# Patient Record
Sex: Male | Born: 1950 | Race: Black or African American | Hispanic: No | Marital: Married | Smoking: Former smoker
Health system: Southern US, Community
[De-identification: ages and names within clinical notes are randomized; demographics above are authoritative.]

## PROBLEM LIST (undated history)

## (undated) DIAGNOSIS — C61 Malignant neoplasm of prostate: Secondary | ICD-10-CM

## (undated) DIAGNOSIS — G4733 Obstructive sleep apnea (adult) (pediatric): Secondary | ICD-10-CM

## (undated) DIAGNOSIS — N200 Calculus of kidney: Secondary | ICD-10-CM

## (undated) DIAGNOSIS — E785 Hyperlipidemia, unspecified: Secondary | ICD-10-CM

## (undated) DIAGNOSIS — M1990 Unspecified osteoarthritis, unspecified site: Secondary | ICD-10-CM

## (undated) DIAGNOSIS — E119 Type 2 diabetes mellitus without complications: Secondary | ICD-10-CM

## (undated) DIAGNOSIS — Z9989 Dependence on other enabling machines and devices: Principal | ICD-10-CM

## (undated) DIAGNOSIS — I10 Essential (primary) hypertension: Secondary | ICD-10-CM

## (undated) HISTORY — DX: Essential (primary) hypertension: I10

## (undated) HISTORY — DX: Dependence on other enabling machines and devices: Z99.89

## (undated) HISTORY — DX: Hyperlipidemia, unspecified: E78.5

## (undated) HISTORY — DX: Type 2 diabetes mellitus without complications: E11.9

## (undated) HISTORY — PX: BACK SURGERY: SHX140

## (undated) HISTORY — DX: Malignant neoplasm of prostate: C61

## (undated) HISTORY — PX: PROSTATE SURGERY: SHX751

## (undated) HISTORY — DX: Calculus of kidney: N20.0

## (undated) HISTORY — DX: Obstructive sleep apnea (adult) (pediatric): G47.33

## (undated) SURGERY — COLONOSCOPY
Anesthesia: Moderate Sedation

---

## 1998-01-06 ENCOUNTER — Ambulatory Visit: Admission: RE | Admit: 1998-01-06 | Discharge: 1998-01-06 | Payer: Self-pay | Admitting: Family Medicine

## 1998-02-24 ENCOUNTER — Encounter: Admission: RE | Admit: 1998-02-24 | Discharge: 1998-02-24 | Payer: Self-pay | Admitting: *Deleted

## 1998-03-24 ENCOUNTER — Encounter: Payer: Self-pay | Admitting: Emergency Medicine

## 1998-03-24 ENCOUNTER — Emergency Department (HOSPITAL_COMMUNITY): Admission: EM | Admit: 1998-03-24 | Discharge: 1998-03-24 | Payer: Self-pay | Admitting: Emergency Medicine

## 1998-06-22 ENCOUNTER — Ambulatory Visit: Admission: RE | Admit: 1998-06-22 | Discharge: 1998-06-22 | Payer: Self-pay | Admitting: Internal Medicine

## 1999-03-05 ENCOUNTER — Encounter: Payer: Self-pay | Admitting: Occupational Medicine

## 1999-03-05 ENCOUNTER — Ambulatory Visit: Admission: RE | Admit: 1999-03-05 | Discharge: 1999-03-05 | Payer: Self-pay | Admitting: Occupational Medicine

## 2001-12-28 ENCOUNTER — Encounter: Payer: Self-pay | Admitting: Emergency Medicine

## 2001-12-28 ENCOUNTER — Emergency Department (HOSPITAL_COMMUNITY): Admission: EM | Admit: 2001-12-28 | Discharge: 2001-12-28 | Payer: Self-pay | Admitting: Emergency Medicine

## 2002-06-05 ENCOUNTER — Emergency Department (HOSPITAL_COMMUNITY): Admission: EM | Admit: 2002-06-05 | Discharge: 2002-06-05 | Payer: Self-pay | Admitting: Emergency Medicine

## 2002-06-05 ENCOUNTER — Encounter: Payer: Self-pay | Admitting: Emergency Medicine

## 2006-11-21 ENCOUNTER — Ambulatory Visit: Admission: RE | Admit: 2006-11-21 | Discharge: 2007-02-19 | Payer: Self-pay | Admitting: Radiation Oncology

## 2007-01-15 LAB — URINALYSIS, MICROSCOPIC - CHCC
Bilirubin (Urine): NEGATIVE
Blood: NEGATIVE
Ketones: NEGATIVE mg/dL
Nitrite: NEGATIVE
Specific Gravity, Urine: 1.01 (ref 1.003–1.035)

## 2007-02-02 ENCOUNTER — Encounter: Admission: RE | Admit: 2007-02-02 | Discharge: 2007-02-02 | Payer: Self-pay | Admitting: Urology

## 2007-02-26 ENCOUNTER — Ambulatory Visit (HOSPITAL_BASED_OUTPATIENT_CLINIC_OR_DEPARTMENT_OTHER): Admission: RE | Admit: 2007-02-26 | Discharge: 2007-02-26 | Payer: Self-pay | Admitting: Urology

## 2007-03-19 ENCOUNTER — Ambulatory Visit: Admission: RE | Admit: 2007-03-19 | Discharge: 2007-04-08 | Payer: Self-pay | Admitting: Radiation Oncology

## 2007-04-10 ENCOUNTER — Ambulatory Visit: Payer: Self-pay | Admitting: Internal Medicine

## 2007-04-16 ENCOUNTER — Encounter (INDEPENDENT_AMBULATORY_CARE_PROVIDER_SITE_OTHER): Payer: Self-pay | Admitting: *Deleted

## 2007-04-16 ENCOUNTER — Inpatient Hospital Stay (HOSPITAL_COMMUNITY): Admission: EM | Admit: 2007-04-16 | Discharge: 2007-04-17 | Payer: Self-pay | Admitting: Emergency Medicine

## 2007-05-18 ENCOUNTER — Ambulatory Visit: Payer: Self-pay | Admitting: Internal Medicine

## 2007-06-01 ENCOUNTER — Encounter: Payer: Self-pay | Admitting: Internal Medicine

## 2007-06-01 ENCOUNTER — Ambulatory Visit: Payer: Self-pay | Admitting: Internal Medicine

## 2008-03-30 ENCOUNTER — Inpatient Hospital Stay (HOSPITAL_COMMUNITY): Admission: EM | Admit: 2008-03-30 | Discharge: 2008-04-02 | Payer: Self-pay | Admitting: Physician Assistant

## 2008-03-31 ENCOUNTER — Encounter (INDEPENDENT_AMBULATORY_CARE_PROVIDER_SITE_OTHER): Payer: Self-pay | Admitting: Internal Medicine

## 2008-03-31 ENCOUNTER — Ambulatory Visit: Payer: Self-pay | Admitting: Gastroenterology

## 2008-03-31 ENCOUNTER — Ambulatory Visit: Payer: Self-pay | Admitting: Surgery

## 2008-04-01 ENCOUNTER — Encounter (INDEPENDENT_AMBULATORY_CARE_PROVIDER_SITE_OTHER): Payer: Self-pay | Admitting: Internal Medicine

## 2008-04-15 ENCOUNTER — Encounter: Admission: RE | Admit: 2008-04-15 | Discharge: 2008-04-15 | Payer: Self-pay | Admitting: Family Medicine

## 2008-04-17 ENCOUNTER — Encounter: Admission: RE | Admit: 2008-04-17 | Discharge: 2008-04-17 | Payer: Self-pay | Admitting: Family Medicine

## 2010-09-21 NOTE — Op Note (Signed)
Rodney Walsh, PLOUFF NO.:  [id number]   MEDICAL RECORD NO.:  [id number]          PATIENT TYPE:  AMB   LOCATION:  [facility]                         FACILITY:  [facility]   PHYSICIAN:  Valetta Fuller, M.D.  DATE OF BIRTH:  Aug 16, 1950   DATE OF PROCEDURE:  02/26/2007  DATE OF DISCHARGE:                               OPERATIVE REPORT   PREOPERATIVE DIAGNOSIS:  Clinical stage T1C adenocarcinoma prostate,  intermediate risk.   POSTOPERATIVE DIAGNOSIS:  Clinical stage T1C adenocarcinoma prostate,  intermediate risk.   PROCEDURE PERFORMED:  1. Interstitial I-125 seed implantation of the prostate.  2. Flexible cystoscopy   SURGEON:  Valetta Fuller, M.D.   ASSISTANT:  Maryln Gottron, M.D.   ANESTHESIA:  General.   INDICATIONS:  Mr. Schwandt is a 60 year old male who was diagnosed with  intermediate risk clinical stage T1C adenocarcinoma of the prostate.  The patient's PSA was minimally elevated in the 3-3.5 range.  The  patient did, however, have a higher Gleason score component to his tumor  with tumor at the left base and left mid portions of the prostate.  This  was of moderate volume in these locations, and was Gleason 4 + 3 equal  7.  The patient's prostate was approximately 45 grams.  The patient was  felt not to be a particularly good surgical candidate because of some  medical comorbidities as well as morbid obesity with a weight in excess  of 300 pounds..  We sent him for radiation consultation.  It was some  elected to treat him with 5 weeks of external beam, with conformal  radiation therapy, and then a seed boost.  He presents now for the seed  boost.  He has done well with his ion Mark 2 radiation therapy.  The  patient appears to understand the advantages and disadvantages of  radiation therapy for treatment of prostate cancer.  Full informed  consent was obtained.  The potential complications and risks were  discussed with the patient in detail.   TECHNIQUE AND FINDINGS:  The patient was brought to the operating room.  He had successful induction of general endotracheal anesthesia and was  placed in the mid lithotomy position.  Foley catheter with contrast in  the balloon was then placed, and the bladder emptied.  A transrectal  ultrasound was placed, and anchored to the stand.  The radiation  oncology then did real time contouring of the prostate, urethra, and  rectum.  Dosing parameters were then set and plan established.  Anchor  needles were then placed in the prostate.  The [employer] was utilized..  A total of 22 needles were placed with each  needle passage done with real time sagittal ultrasound guidance.  Once  positioning of the needle was confirmed, then the robotic implanter  delivered the seeds.  A total of 53 active seeds were implanted.  At the  completion of the procedure, the fluoroscopic image demonstrated nice  distribution of the seeds.  The Foley catheter was removed and flexible  cystoscopy was performed.  No seeds were  noted.  A new Foley catheter was placed and left to drainage bag.  The  patient appeared to tolerate the procedure well.  There are no obvious  complications or difficulties, and the patient was brought to the  recovery room in stable condition.           ______________________________  Valetta Fuller, M.D.  Electronically Signed     DSG/MEDQ  D:  02/27/2007  T:  02/27/2007  Job:  [id number]   cc:   [facility] Dr. Gearldine Bienenstock

## 2010-09-21 NOTE — Discharge Summary (Signed)
NAMERENLY, ROOTS NO.:  [id number]   MEDICAL RECORD NO.:  [id number]          PATIENT TYPE:  INP   LOCATION:  1407                         FACILITY:  [facility]   PHYSICIAN:  Beckey Rutter, MD  DATE OF BIRTH:  08/03/1950   DATE OF ADMISSION:  03/29/2008  DATE OF DISCHARGE:                               DISCHARGE SUMMARY   PRIMARY CARE PHYSICIAN:  Unassigned.   CHIEF COMPLAINT/HISTORY OF PRESENT ILLNESS:  This is a 60 year old  pleasant African American male with a past medical history significant  for hypertension, diabetes type 2, congestive heart failure,  hyperlipidemia admitted on November 12th because of passed out.   HOSPITAL COURSE:  During the hospital stay, the patient was worked up  for syncope.  The results of the syncope are essentially negative,  although the MRI and MRA could not be done secondary to his weight.  The  patient will need to go outside for MRI and MRA.  He preferred to follow  up with Dr. Nilda Riggs to further evaluate this syncope.  He wanted  an open MRI to be done after he visited Dr. Ramiro Harvest.  I had a lengthy  discussion with him regarding the etiology of his syncope, and the plan  now, as discussed above, is to be released and do an MRI as per Dr.  Nilda Riggs.   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  I had a  discussion with the neurologist on call, Dr. Terrace Arabia, today.  She  recommended, according to the [facility], the patient should not  be driving for 6 months, and that includes, of course, driving the  school bus or his private vehicle.   HOSPITAL PROCEDURES:  1. EEG interpreted by Dr. Lesia Sago.  Impression:  This is a normal      EEG recording in awake and sleeping states.  No evidence of ictal      or interictal changes were seen.  2. Renal ultrasound done on January 29, 2008 showing no acute      findings by  renal ultrasound.  3. CT head without contrast done this morning, April 02, 2008.      Patient was showing cerebral atrophy without acute intracranial      abnormality.  4. On March 29, 2008, patient had a chest x-ray that showed      cardiomegaly without evidence of acute cardiomyopathy.   DISCHARGE DIAGNOSES:  1. Syncope, etiology unclear.  2. Hypertension.  3. Diabetes type 2.  4. Congestion heart failure.  5. Hyperlipidemia.   DISCHARGE MEDICATIONS:  1. Metformin 500 mg p.o. b.i.d.  2. Potassium chloride 20 mEq p.o. daily.  3. Lasix 40 mg p.o. daily.  4. Lisinopril 10 mg p.o. daily.  5. Prazosin 40 mg p.o. at bedtime.  6. Aspirin 81 mg daily.  7. Over-the-counter Aleve on a p.r.n. basis.   DISCHARGE PLAN:  Patient is discharged to follow up with Dr. Nilda Riggs next week.  Patient will  need an MRI/MRA and 2D echo for a  completion of his workup.  The need for a carotid duplex with him, and  he also wanted to follow up with Dr. Ramiro Harvest for the rest of the  workup.   Discussion time, including counseling, is more than 45 minutes.      Beckey Rutter, MD  Electronically Signed     EME/MEDQ  D:  04/02/2008  T:  04/02/2008  Job:  [id number]   cc:   Dr. Nilda Riggs

## 2010-09-21 NOTE — Discharge Summary (Signed)
Rodney Walsh, Rodney Walsh NO.:  [id number]   MEDICAL RECORD NO.:  [id number]           PATIENT TYPE:   LOCATION:                                 FACILITY:   PHYSICIAN:  Hillery Aldo, M.D.   DATE OF BIRTH:  [birth year]   DATE OF ADMISSION:  04/16/2007  DATE OF DISCHARGE:  04/17/2007                               DISCHARGE SUMMARY   PRIMARY CARE PHYSICIAN:  Unassigned.  [facility].   CARDIOLOGIST:  Dr. Jenne Campus.   UROLOGIST:  Dr. Isabel Caprice.   DISCHARGE DIAGNOSES:  1. Dyspnea:  Multifactorial with volume overload , diastolic      dysfunction/congestive heart failure, obesity hypoventilation      syndrome, and obstructive sleep apnea contributory.  2. Hypertension.  3. Type 2 diabetes.  4. Prostate cancer status post seed implant placement by Dr. Isabel Caprice.  5. Obstructive sleep apnea/obesity hypoventilation syndrome.  6. Eczema of the left thumb.   DISCHARGE MEDICATIONS:  1. Metformin 500 mg b.i.d.  2. Lisinopril 20 mg daily.  3. Flomax 0.4 mg daily.  4. Aspirin 81 mg daily.  5. Claritin 10 mg daily.  6. Lasix 40 mg daily.  7. Potassium chloride 20 mEq daily.  8. Triamcinolone ointment 0.5% to left thumb t.i.d. until symptoms      resolve.   CONSULTATIONS:  None.   BRIEF ADMISSION HISTORY OF PRESENT ILLNESS:  The patient is a 60-year-  old male who presented to the hospital with complaints of shortness of  breath and orthopnea.  He denied any associated chest pain or lower  extremity swelling.  He had no fever or cough.  He was instructed by [facility]  physician [facility] and to call 911 and to take 4 baby  aspirin, which he did.  He was admitted for further evaluation and  workup.  For the full details, please see the dictated admission H&P  done by Dr. Corky Downs.   PROCEDURES AND DIAGNOSTIC STUDIES:  1. Chest x-ray on April 15, 2007 showed increased heart size since      prior study,  with increased pulmonary vascular congestion and      diffuse interstitial infiltrates.  These findings were felt to be      consistent with interstitial edema and congestive heart failure.  2. 2-D echocardiogram on April 16, 2007, showed normal left      ventricular systolic function with an ejection fraction estimated      at 65%.  There was no diagnostic evidence of left ventricular      regional wall motion abnormality.  The left atrium was mildly      dilated.   DISCHARGE LABORATORY VALUES:  Cholesterol was 134, HDL 35, LDL 73,  triglycerides 132.  Cardiac enzymes were negative with the exception of  elevated total CK levels.  Hemoglobin A1c was 6.1%.  TSH was 2.667.  BNP  was less than 30.   HOSPITAL COURSE:  1. Dyspnea:  The patient's dyspnea was felt to be multifactorial.  Due  to concerns for acute decompensated congestive heart failure, the      patient was admitted to the telemetry floor and put on IV diuresis.      Despite the findings on chest radiography, his BNP was less than 30      on two occasions, which is not consistent with acute CHF.  To work      this up further, the patient underwent 2-D echocardiography with      the findings as noted above.  There was no evidence of systolic      dysfunction.  It is possible that the patient did have acute      diastolic heart failure related to his hypertension.  This was      likely superimposed on the background of ongoing obesity      hypoventilation syndrome, obstructive sleep apnea, and volume      overload related to excessive salt intake over the Thanksgiving      holiday.  Nevertheless, the patient responded well to diuresis, and      given his ongoing blood pressure control issues, will be discharged      on a routine dose of Lasix daily.  He is instructed to follow up      with Dr. Jenne Campus in 2-3 weeks' time for further evaluation and to      determine if his Lasix is adequately controlling his symptoms.  2.  Hypertension:  The patient's blood pressure was suboptimally      controlled with systolic pressures in the 170s.  He was continued      on lisinopril, and Lasix was added.  He should follow up with Dr.      Jenne Campus in 2-3 weeks to determine if his blood pressure is better      controlled with the addition of Lasix.  3. Diabetes:  The patient's diabetic control is excellent on his      current dose of metformin.  4. Prostate cancer:  The patient is status post seed implantation.  He      should follow up with Dr. Isabel Caprice as scheduled.  5. Obstructive sleep apnea/obesity hypoventilation syndrome:  The      patient was continued on the CPAP while in the hospital.  He should      continue to use CPAP at home with a large full-face mask.  6. Eczema:  The patient did have longstanding eczema affecting his      left thumb.  He was given a prescription for triamcinolone cream to      help with this.   DISPOSITION:  The patient is medically stable for discharge home.  He is  advised to follow up with Dr. Jenne Campus in 2-3 weeks.      Hillery Aldo, M.D.  Electronically Signed     CR/MEDQ  D:  04/17/2007  T:  04/17/2007  Job:  [id number]   cc:   Valetta Fuller, M.D.  Fax: [phone]   Darlin Priestly, MD  Fax: [phone]

## 2010-09-21 NOTE — Procedures (Signed)
EEG NUMBER:  [id number].   This is a portable EEG recording done on March 31, 2008.   HISTORY:  This is a 60 year old with a history of syncope.  The patient  with history of drinking on the day prior to the event and had some  dizziness and loss of consciousness.  The patient being evaluated for  the above.   Medications include Lovenox, Protonix, Ativan, Zestril, Zocor, aspirin,  Rocephin, Reglan, and Ambien.   EEG CLASSIFICATION:  Normal awake and asleep.   DESCRIPTION OF RECORDING:  Background rhythm of this recording consists  of a fairly well-modulated medium amplitude 10 Hz background activity  that is reactive to eye opening and closure.  As the record progresses,  the initial phases of the recording are associated with stage II sleep  with sleep spindles and occasional vertex sharp wave activity seen.  As  the record progresses, the patient is eventually awakened towards the  end of the recording and normal symmetric background activities are  seen.  Photic stimulation and hyperventilation were not performed.  At  no time during the recording, there appeared to be evidence of spike or  spike-wave discharges or evidence of focal slowing.  EKG monitor shows  no evidence of cardiac rhythm abnormalities with the heart rate of 72.   IMPRESSION:  This is a normal EEG recording in awake and sleeping state.  No evidence of ictal or interictal discharges were seen.      Marlan Palau, M.D.  Electronically Signed     XBJ:YNWG  D:  04/01/2008 07:11:46  T:  04/01/2008 07:30:19  Job #:  [id number]

## 2010-09-21 NOTE — H&P (Signed)
NAMEJUNO, BOZARD NO.:  [id number]   MEDICAL RECORD NO.:  [id number]          PATIENT TYPE:  INP   LOCATION:  1407                         FACILITY:  [facility]   PHYSICIAN:  Della Goo, M.D. DATE OF BIRTH:  March 23, 1951   DATE OF ADMISSION:  05/08/2008  DATE OF DISCHARGE:                              HISTORY & PHYSICAL   PRIMARY CARE PHYSICIAN:  Unassigned.   CHIEF COMPLAINT:  Passed out x2.   HISTORY OF PRESENT ILLNESS:  This is a 60 year old male who was sent to  the emergency department emergently after suffering 2 syncopal episodes  in the afternoon.  These episodes were followed by a loss of urine, per  report of family members.  The patient reportedly had been drinking  alcohol and at the time of my interview states that he had been drinking  approximately 13 of a pint of alcohol and 2 beers during the day, and  also had not eaten.  The patient does have a history of heavy drinking.  He also has a history of prostate cancer and is status post radiation  seed implants.   PAST MEDICAL HISTORY:  As mentioned above.  1. History of hypertension.  2. Type 2 diabetes mellitus.  3. Congestive heart failure syndrome.  4. Hyperlipidemia.   MEDICATIONS:  1. Metformin 500 mg p.o. b.i.d.  2. Potassium chloride 20 mEq p.o. daily.  3. Lasix 40 mg p.o. daily.  4. Lisinopril 10 mg p.o. daily.  5. Pravastatin 40 mg p.o. at bedtime.  6. Aspirin 81 mg p.o. daily.  7. Over-the-counter Aleve p.r.n.   ALLERGIES:  NO KNOWN DRUG ALLERGIES.   SOCIAL HISTORY:  The patient reports smoking one pack every 3 days, and  he reports a long history of heavy alcohol usage.   FAMILY HISTORY:  Noncontributory.   REVIEW OF SYSTEMS:  Pertinents are mentioned above.   PHYSICAL EXAMINATION FINDINGS:  This is a 60 year old obese male who is  currently awake and in no visible discomfort or distress.  VITAL SIGNS:  Temperature 98.9; initially his blood pressure was 88/40,  and is  now 104/61, heart rate 78, respirations 12, O2 saturations 93-  99%.  HEENT: Normocephalic, atraumatic.  Pupils equally round and reactive to  light.  Extraocular movements are intact.  Funduscopic benign.  Oropharynx is clear.  NECK:  Supple, full range of motion.  No thyromegaly, adenopathy or  jugular venous distention.  CARDIOVASCULAR:  Regular rate and rhythm.  No murmurs, gallops or rubs.  LUNGS: Clear to auscultation bilaterally.  ABDOMEN:  Positive bowel sounds; soft, nontender, nondistended.  Obese.  There is no hepatosplenomegaly.  EXTREMITIES:  Without cyanosis, clubbing or edema.  NEUROLOGIC:  Examination nonfocal.   LABORATORY STUDIES:  White blood cell count 8.6, hemoglobin 14.0,  hematocrit 42.2, platelets 292, neutrophils 85%, lymphocytes 8%.  Sodium  139, potassium 4.1, chloride 99, carbon dioxide 27, BUN 22, creatinine  2.52, glucose 157.  Alcohol level 43.  Urine drug screen negative.  Urinalysis:  Trace leukocyte esterase; turbid in color.  Protein 100  mg/dL.  Urine nitrates negative.  CT scan  of the head performed reveals  cerebral atrophy without acute intracranial abnormality.   ASSESSMENT:  A 60 year old male being admitted with  1. Syncope versus seizure.  2. Alcoholism and possible alcohol related withdrawal seizure.  3. Hypotension, possible orthostatic hypotension.  4. Type 2 diabetes mellitus.  5. Prostate cancer.   PLAN:  The patient will be admitted to a telemetry area for cardiac  monitoring.  Cardiac enzymes will also be performed.  The patient will  be placed on neurologic checks and monitored for further changes.  An  EEG study has been ordered, along with an MRI/MRA study of the brain.  The patient's regular medications will be continued, except the  metformin will be held secondary to possible IV dye studies -- which may  be performed while he is hospitalized.  The patient will be placed on  the IV Ativan alcohol withdrawal protocol.  Sliding  scale insulin will  also be ordered as needed for hyperglycemia, and DVT and GI prophylaxis  have been ordered.  The patient has been counseled regarding his alcohol  usage and it has also been explained that because of this episode today,  it may have been secondary to alcoholism and possible withdrawal.      Della Goo, M.D.  Electronically Signed     HJ/MEDQ  D:  03/30/2008  T:  03/30/2008  Job:  [id number]

## 2010-09-21 NOTE — H&P (Signed)
NAME:  Rodney Walsh, Rodney Walsh               ACCOUNT NO.:  [id number]   MEDICAL RECORD NO.:  [id number]          PATIENT TYPE:  INP   LOCATION:  0101                         FACILITY:  [facility]   PHYSICIAN:  Mobolaji B. Bakare, M.D.DATE OF BIRTH:  1950/11/24   DATE OF ADMISSION:  04/15/2007  DATE OF DISCHARGE:                              HISTORY & PHYSICAL   PRIMARY CARE PHYSICIAN:  Unassigned.  The patient attends Urgent Care.   CARDIOLOGIST:  Darlin Priestly, MD.   CHIEF COMPLAINT:  Shortness of breath.   HISTORY OF PRESENT COMPLAINT:  Rodney Walsh is a 60 year old African-  American male with history of hypertension, diabetes mellitus, obesity;  and recently diagnosed with prostate cancer.  He was in his usual state  of health until 2 days ago, when he developed shortness of breath  particularly with exertion.  He found it difficult to bend down and tie  up his shoes, or do activities that he normally does without any  remarkable breathing problems.  Last night he developed orthopnea.  He  found it difficult to lie down flat.  He denies chest pain.  No lower  extremity swelling.  He denies fever or cough.   The patient called his physician at Urgent Care yesterday.  He was told  to call 9-1-1 and to use 4 baby aspirin; which he did.  Prior to this he  tried using his CPAP to see if that would help; but there was no  improvement with the CPAP.  The patient was brought to the emergency  room.  He had a chest x-ray done, which showed acute CHF.  He received  40 mg of Lasix intravenously.  He has diuresed over 300 mL of urine  since the Lasix was given approximately 4 hours ago.  He is currently  feeling better, and he is lying down in bed without any distress.   REVIEW OF SYSTEMS:  The patient denies abdominal pain, nausea, vomiting,  diaphoresis or diarrhea.  He has no cough or chest pain.   PAST MEDICAL HISTORY:  1. Diabetes mellitus.  2. Hypertension.  3. Prostate cancer, clinical  stage T1C adenocarcinoma of the prostate.      He is status post seed implantation by Dr. Isabel Caprice.  4. Obstructive sleep apnea, on CPAP at night.  5. He had a positive stress test about 2 years ago, according to the      patient's report by Dr. Jenne Campus. This was followed by a cardiac      catheterization.  He reports that the cardiac catheterization was      okay.   CURRENT MEDICATIONS:  Metformin, lisinopril, Flomax, baby aspirin;  p.r.n. Aleve, Claritin.  The patient cannot recall the dosages of these  medications.   ALLERGIES:  NO KNOWN DRUG ALLERGIES.   SOCIAL HISTORY:  The patient does not smoke cigarettes.  He drinks  alcohol about a pint of hard liquor over the weekend.  He is a school  bus driver.   FAMILY HISTORY:  Mother died of ovarian cancer, and father passed away  in his 68s.  PHYSICAL EXAMINATION:  VITALS:  Initial vitals on admission --  Temperature 98.7, blood pressure 178/83, pulse 78, respiratory rate 19,  O2 saturations 100% on oxygen.  Current Vitals -- Blood pressure 130/65,  pulse 68, respiratory rate 18, O2 saturations 100%.  GENERAL:  On examination the patient is awake, alert; oriented in time,  place and person.  HEENT:  Head normocephalic and atraumatic.  Not pale; anicteric.  Mucous  membranes moist.  No oral thrush.  NECK:  No discernable JVD, because of a short neck.  No thyromegaly  palpable.  LUNGS:  No wheezes or rhonchi.  Diminished air entry bibasilar.  CVS:  S1, S2 regular.  No murmur and no gallop.  ABDOMEN:  Obese, soft and nontender.  No palpable organomegaly.  Bowel  sounds present.  EXTREMITIES:  One plus pitting pedal edema.  No cyanosis, dorsalis pedis  pulses 2+ bilaterally.  CNS:  No focal neurological deficits.   LABORATORY DATA:  Initial laboratory data:  Cardiac markers at point-of-  care showed CK-MB 1.9, troponin less than 0.05.  Myoglobin 185.  BNP  less than 30.  Sodium 137, potassium 3.8, chloride 102, CO2 28, glucose   113, BUN 10, creatinine 1.04.  Bilirubin 0.5, alkaline phosphatase 56,  AST 36, ALT 43.  Total protein 6.5, albumin 3.4, calcium 8.7.  White  cell count 5.1, hemoglobin 12, hematocrit 35.4.  MCV 92.4, platelets  227, monocytes 50%.  Absolute monocyte count is normal.   CHEST X-RAY:  Shows acute congestive heart failure.   EKG:  Normal sinus rhythm. Normal interval.  No LVH noted.   ASSESSMENT AND PLAN:  Rodney Walsh is a 60 year old African-American  male, with history of diabetes mellitus and hypertension.  Presenting  with shortness of breath, particularly on exertion.  Orthopnea, lower  extremity edema.  Chest x-ray is in keeping with congestive heart  failure.  He has no chest pain.  He had cardiac catheterization about 2  years ago by Dr. Jenne Campus.  The patient reports that this was normal.  Full details are unknown.   DIAGNOSES:  1. NEW ONSET CONGESTIVE HEART FAILURE.  The patient will be continued      on Lasix 40 mg IV daily.  Will continue lisinopril as before; will      start him on 10 mg daily for now until home dosage is available.      Continue aspirin 81 mg daily.  Will weigh daily.  Place on fluid      restriction of 200 mL.  Will start a 2 gram salt diet.  Will check      TSH.  Cycle cardiac enzymes.  Fasting lipid profile.  Check a 2-D      echocardiogram.  He may require a beta blocker, will defer until      ejection fraction is assessed.  Will monitor BMETs closely while on      diuretics.  Will consult cardiology  in the morning.  2. DIABETES MELLITUS.  Will continue metformin at 500 mg b.i.d., until      home dosage is available.  Will add sliding-scale insulin.  Will      check hemoglobin A1c.  3. HYPERTENSION.  Will continue lisinopril and make necessary      adjustment, depending on his blood pressure during the course of      hospitalization.  4. MILD ANEMIA.  Will check an anemia panel. Will do a stool      Hemoccult.  The patient has  a scheduled screening  colonoscopy by      [facility] this week.  He may have to cancel this appointment,      given the recent acute illness.  The patient's hemoglobin and      hematocrit in October 2008 were 11.5/24; hence it is relatively      stable.  5. OBSTRUCTIVE SLEEP APNEA.  Will continue with CPAP at night during      the course of hospitalization.  6. PROSTATE CANCER.  Clinical stage T1C.  He is status post seed      implantation.  Will continue with Flomax 0.4 mg daily.  7. OBESITY.  Will offer weight loss carefully.      Mobolaji B. Corky Downs, M.D.  Electronically Signed     MBB/MEDQ  D:  04/16/2007  T:  04/16/2007  Job:  [id number]   cc:   Darlin Priestly, MD  Fax: [phone]   Valetta Fuller, M.D.  Fax: [phone]

## 2011-02-09 LAB — HEPATIC FUNCTION PANEL
Albumin: 3.6
Total Bilirubin: 0.6
Total Protein: 6.7

## 2011-02-09 LAB — BASIC METABOLIC PANEL
BUN: 14
CO2: 29
CO2: 32
Calcium: 9.1
Calcium: 9.2
Chloride: 101
Creatinine, Ser: 1.14
Creatinine, Ser: 2.32 — ABNORMAL HIGH
GFR calc Af Amer: >60
Glucose, Bld: 120 — ABNORMAL HIGH

## 2011-02-09 LAB — COMPREHENSIVE METABOLIC PANEL
AST: 24
Albumin: 4
Alkaline Phosphatase: 52
BUN: 22
CO2: 27
GFR calc non Af Amer: 27 — ABNORMAL LOW
Glucose, Bld: 157 — ABNORMAL HIGH
Total Protein: 6.9

## 2011-02-09 LAB — RAPID URINE DRUG SCREEN, HOSP PERFORMED
Amphetamines: NOT DETECTED
Benzodiazepines: NOT DETECTED

## 2011-02-09 LAB — AMMONIA: Ammonia: 18

## 2011-02-09 LAB — CK
Total CK: 251 — ABNORMAL HIGH
Total CK: 367 — ABNORMAL HIGH

## 2011-02-09 LAB — CROSSMATCH
ABO/RH(D): O POS
Antibody Screen: NEGATIVE

## 2011-02-09 LAB — VITAMIN D 25 HYDROXY (VIT D DEFICIENCY, FRACTURES): Vit D, 25-Hydroxy: 15 — ABNORMAL LOW (ref 30–89)

## 2011-02-09 LAB — URINALYSIS, ROUTINE W REFLEX MICROSCOPIC
Glucose, UA: NEGATIVE
Hgb urine dipstick: NEGATIVE
Nitrite: NEGATIVE

## 2011-02-09 LAB — CK TOTAL AND CKMB (NOT AT ARMC)
CK, MB: 1.9
Total CK: 400 — ABNORMAL HIGH

## 2011-02-09 LAB — FERRITIN: Ferritin: 180 (ref 22–322)

## 2011-02-09 LAB — RENAL FUNCTION PANEL
Albumin: 3.4 — ABNORMAL LOW
Calcium: 8.9
GFR calc Af Amer: >60
GFR calc non Af Amer: >60
Phosphorus: 3.2
Potassium: 4.9
Sodium: 138

## 2011-02-09 LAB — CBC
HCT: 38.9 — ABNORMAL LOW
Hemoglobin: 14
MCHC: 33.2
MCHC: 33.5
Platelets: 218
Platelets: 292
RDW: 13.1
RDW: 13.3
WBC: 4.6

## 2011-02-09 LAB — HEMOGLOBIN A1C
Hgb A1c MFr Bld: 6.7 — ABNORMAL HIGH
Mean Plasma Glucose: 146

## 2011-02-09 LAB — CARDIAC PANEL(CRET KIN+CKTOT+MB+TROPI)
CK, MB: 2.2
Relative Index: 0.5
Relative Index: 0.5
Total CK: 389 — ABNORMAL HIGH
Total CK: 456 — ABNORMAL HIGH

## 2011-02-09 LAB — URINE CULTURE: Colony Count: NO GROWTH

## 2011-02-09 LAB — GLUCOSE, CAPILLARY
Glucose-Capillary: 109 — ABNORMAL HIGH
Glucose-Capillary: 109 — ABNORMAL HIGH
Glucose-Capillary: 113 — ABNORMAL HIGH
Glucose-Capillary: 123 — ABNORMAL HIGH
Glucose-Capillary: 128 — ABNORMAL HIGH
Glucose-Capillary: 136 — ABNORMAL HIGH
Glucose-Capillary: 137 — ABNORMAL HIGH
Glucose-Capillary: 163 — ABNORMAL HIGH

## 2011-02-09 LAB — HEMOGLOBIN AND HEMATOCRIT, BLOOD: HCT: 37.3 — ABNORMAL LOW

## 2011-02-09 LAB — DIFFERENTIAL
Basophils Absolute: 0
Basophils Relative: 0

## 2011-02-09 LAB — TSH: TSH: 0.911

## 2011-02-09 LAB — IRON AND TIBC
Iron: 89
UIBC: 186

## 2011-02-09 LAB — PSA: PSA: 0.05 — ABNORMAL LOW

## 2011-02-09 LAB — URINE MICROSCOPIC-ADD ON

## 2011-02-09 LAB — ABO/RH: ABO/RH(D): O POS

## 2011-02-09 LAB — PTH, INTACT AND CALCIUM
Calcium, Total (PTH): 8.9
PTH: 43.7

## 2011-02-14 LAB — COMPREHENSIVE METABOLIC PANEL
ALT: 43
AST: 36
Albumin: 3.4 — ABNORMAL LOW
Alkaline Phosphatase: 56
CO2: 28
Chloride: 102
Creatinine, Ser: 1.04
GFR calc Af Amer: >60
GFR calc non Af Amer: >60
Potassium: 3.8
Sodium: 137
Total Bilirubin: 0.8

## 2011-02-14 LAB — TROPONIN I: Troponin I: 0.02

## 2011-02-14 LAB — FOLATE: Folate: 7.5

## 2011-02-14 LAB — BASIC METABOLIC PANEL
CO2: 30
Chloride: 104
GFR calc Af Amer: >60
Sodium: 140

## 2011-02-14 LAB — CK TOTAL AND CKMB (NOT AT ARMC)
CK, MB: 4
Relative Index: 0.4

## 2011-02-14 LAB — CARDIAC PANEL(CRET KIN+CKTOT+MB+TROPI)
CK, MB: 3.8
Relative Index: 0.4
Total CK: 918 — ABNORMAL HIGH
Troponin I: 0.02
Troponin I: 0.03

## 2011-02-14 LAB — IRON AND TIBC
Saturation Ratios: 21
TIBC: 297

## 2011-02-14 LAB — CBC
MCV: 92.9
Platelets: 227
RBC: 3.81 — ABNORMAL LOW
WBC: 5.1

## 2011-02-14 LAB — DIFFERENTIAL
Basophils Absolute: 0
Eosinophils Absolute: 0.1 — ABNORMAL LOW
Eosinophils Relative: 1
Lymphocytes Relative: 13
Monocytes Absolute: 0.7

## 2011-02-14 LAB — POCT CARDIAC MARKERS: CKMB, poc: 1.9

## 2011-02-14 LAB — HEMOGLOBIN A1C: Hgb A1c MFr Bld: 6.1

## 2011-02-14 LAB — LIPID PANEL
Cholesterol: 134
HDL: 35 — ABNORMAL LOW
Total CHOL/HDL Ratio: 3.8

## 2011-02-14 LAB — FERRITIN: Ferritin: 190 (ref 22–322)

## 2011-02-14 LAB — B-NATRIURETIC PEPTIDE (CONVERTED LAB): Pro B Natriuretic peptide (BNP): <30

## 2011-02-17 LAB — COMPREHENSIVE METABOLIC PANEL
ALT: 31
AST: 26
CO2: 28
Calcium: 9.1
GFR calc Af Amer: >60
GFR calc non Af Amer: >60
Sodium: 139

## 2011-02-17 LAB — CBC
MCHC: 33.8
RBC: 3.74 — ABNORMAL LOW
WBC: 4.7

## 2011-05-24 ENCOUNTER — Ambulatory Visit (INDEPENDENT_AMBULATORY_CARE_PROVIDER_SITE_OTHER): Payer: BC Managed Care – PPO | Admitting: Physician Assistant

## 2011-05-24 DIAGNOSIS — M79609 Pain in unspecified limb: Secondary | ICD-10-CM

## 2011-05-24 DIAGNOSIS — E119 Type 2 diabetes mellitus without complications: Secondary | ICD-10-CM

## 2011-05-24 DIAGNOSIS — I10 Essential (primary) hypertension: Secondary | ICD-10-CM

## 2011-06-10 ENCOUNTER — Telehealth: Payer: Self-pay

## 2011-06-10 NOTE — Telephone Encounter (Signed)
Pt said pharmacy has faxed [place] 2 refill requests and they have not heard anything back from [place]

## 2011-06-11 NOTE — Telephone Encounter (Signed)
LMOM notifying patient rx 's sent to pharmacy today.

## 2011-06-14 ENCOUNTER — Other Ambulatory Visit: Payer: Self-pay | Admitting: *Deleted

## 2011-06-15 ENCOUNTER — Telehealth: Payer: Self-pay

## 2011-06-15 NOTE — Telephone Encounter (Signed)
Please call Rodney Walsh.  Which meds does he need refilled?

## 2011-06-15 NOTE — Telephone Encounter (Signed)
.  [facility] PT STATES HE CALLED LAST Friday FOR HIS TWO MEDICATIONS (HE COULD NOT REMEMBER THE NAMES)  [facility] [address]  Please call patient and advise him of the statis at [phone]

## 2011-06-16 ENCOUNTER — Telehealth: Payer: Self-pay

## 2011-06-16 MED ORDER — MELOXICAM 15 MG PO TABS: 15.0000 mg | ORAL_TABLET | Freq: Every day | ORAL | Status: DC

## 2011-06-16 MED ORDER — OXYBUTYNIN CHLORIDE 5 MG PO TABS: 5.0000 mg | ORAL_TABLET | Freq: Two times a day (BID) | ORAL | Status: DC

## 2011-06-16 NOTE — Telephone Encounter (Signed)
Please call Rexford Maus- I've refilled the two requested meds and sent them to the pharmacy. csj

## 2011-06-16 NOTE — Telephone Encounter (Signed)
Spoke with pt advised rx sent to pharmacy.

## 2011-06-16 NOTE — Telephone Encounter (Signed)
Spoke with pt and he needs refills on meloxicam 15mg  and oxybutin 5mg . Please advise. Chart at [facility] desk

## 2011-07-11 ENCOUNTER — Encounter: Payer: Self-pay | Admitting: Physician Assistant

## 2011-07-13 ENCOUNTER — Ambulatory Visit (INDEPENDENT_AMBULATORY_CARE_PROVIDER_SITE_OTHER): Payer: BC Managed Care – PPO | Admitting: Physician Assistant

## 2011-07-13 DIAGNOSIS — R319 Hematuria, unspecified: Secondary | ICD-10-CM

## 2011-07-13 LAB — POCT URINALYSIS DIPSTICK
Bilirubin, UA: NEGATIVE
Ketones, UA: NEGATIVE
Spec Grav, UA: 1.015
pH, UA: 8

## 2011-07-13 LAB — POCT UA - MICROSCOPIC ONLY
Bacteria, U Microscopic: NEGATIVE
Mucus, UA: NEGATIVE
Yeast, UA: NEGATIVE

## 2011-07-13 NOTE — Patient Instructions (Signed)
Call Dr. Ellin Goodie office to see if they can see you sooner than your follow-up scheduled for next month.

## 2011-07-13 NOTE — Progress Notes (Signed)
  Subjective:    Patient ID: Rodney Walsh, male    DOB: [birth year], 61 y.o.   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 Reports he thinks that one of the seeds placed in his prostate to treat prostate cancer has come out. He notes that in April he will be 5 years cancer free! He has an appointment with Dr. Isabel Caprice 08/18/2011.  He reports that his left chest "disappeared" and then returned. He would like me to check it today.  Medical history is remarkable for diabetes, hypertension, hyperlipidemia, obesity, obstructive sleep apnea, eczema, nephrolithiasis, but in the deficiency and erectile dysfunction. He is employed as a Surveyor, mining. Will retire in the next 12 months. In 2009 he had several syncopal episodes do to hypoglycemia. He was chopping wood in his yard and did not stay adequately hydrated or fueled.  He has had no recurrent episodes. No hypoglycemia since that event. He continues to struggle with his weight-does not make healthier eating choices on a regular basis. His wife has been very successful with weight loss with the help of a nutritionist but the patient is not motivated at this time.  Review of Systems As above.    Objective:   Physical Exam  I saw "Rodney Walsh" 2 weeks ago for his routine diabetes followup so exam today is limited. Vital signs are noted. He is obese. Awake, alert and oriented in no acute distress. Normocephalic and atraumatic. Sclera and conjunctiva are clear. Abdomen is protuberant. Skin is warm and dry. Normal male external genitalia. Circumcised both testicles are descended, nontender and without mass. No scrotal lesions. No hernia.  Results for orders placed in visit on 07/13/11  POCT UA - MICROSCOPIC ONLY      Component Value Range   WBC, Ur, HPF, POC 0-1     RBC, urine, microscopic TNTC     Bacteria, U Microscopic NEGATIVE       Mucus, UA NEGATIVE     Epithelial cells, urine per micros 0-1     Crystals, Ur, HPF, POC NEGATIVE     Casts, Ur, LPF, POC NEGATIVE     Yeast, UA NEGATIVE    POCT URINALYSIS DIPSTICK      Component Value Range   Color, UA orange-brown     Clarity, UA cloudy     Glucose, UA negative     Bilirubin, UA negative     Ketones, UA negative     Spec Grav, UA 1.015     Blood, UA large     pH, UA 8.0     Protein, UA 30     Urobilinogen, UA 1.0     Nitrite, UA negative     Leukocytes, UA Negative        Assessment & Plan:  1. Hematuria. I've asked him to call Dr. Ellin Goodie office to see if they can see him sooner than April 11.  2. DMV forms. Completed.  He will followup with me at his regularly scheduled appointment next month.

## 2011-08-02 ENCOUNTER — Other Ambulatory Visit: Payer: Self-pay | Admitting: Physician Assistant

## 2011-08-18 ENCOUNTER — Telehealth: Payer: Self-pay

## 2011-08-18 NOTE — Telephone Encounter (Signed)
P 

## 2011-08-18 NOTE — Telephone Encounter (Signed)
Printed out message due to PSA's and Labs are in the patient's paper chart.

## 2011-08-18 NOTE — Telephone Encounter (Signed)
BRANDY FROM [facility] STATES PT IS IN OFFICE NOW AND THEY NEED THE PSA'S AND ALL BLOOD WORK FAXED ON PT PLEASE FAX ASAP TO [phone] AND THE PHONE NUMBER IS [phone]

## 2011-08-30 ENCOUNTER — Ambulatory Visit: Payer: BC Managed Care – PPO | Admitting: Physician Assistant

## 2011-09-11 ENCOUNTER — Other Ambulatory Visit: Payer: Self-pay | Admitting: Physician Assistant

## 2011-09-13 ENCOUNTER — Other Ambulatory Visit: Payer: Self-pay | Admitting: Physician Assistant

## 2011-09-22 ENCOUNTER — Ambulatory Visit: Payer: BC Managed Care – PPO | Admitting: Physician Assistant

## 2011-10-25 ENCOUNTER — Encounter: Payer: Self-pay | Admitting: Physician Assistant

## 2011-10-25 ENCOUNTER — Ambulatory Visit (INDEPENDENT_AMBULATORY_CARE_PROVIDER_SITE_OTHER): Payer: BC Managed Care – PPO | Admitting: Physician Assistant

## 2011-10-25 VITALS — BP 134/78 | HR 62 | Temp 98.3°F | Resp 20 | Ht 63.5 in | Wt 304.2 lb

## 2011-10-25 DIAGNOSIS — Z6841 Body Mass Index (BMI) 40.0 and over, adult: Secondary | ICD-10-CM | POA: Insufficient documentation

## 2011-10-25 DIAGNOSIS — E785 Hyperlipidemia, unspecified: Secondary | ICD-10-CM

## 2011-10-25 DIAGNOSIS — I10 Essential (primary) hypertension: Secondary | ICD-10-CM

## 2011-10-25 DIAGNOSIS — E119 Type 2 diabetes mellitus without complications: Secondary | ICD-10-CM

## 2011-10-25 MED ORDER — METFORMIN HCL 1000 MG PO TABS
1000.0000 mg | ORAL_TABLET | Freq: Two times a day (BID) | ORAL | Status: DC
Start: 2011-10-25 — End: 2012-08-10

## 2011-10-25 NOTE — Progress Notes (Signed)
  Subjective:    Patient ID: Rodney Walsh, male    DOB: 1950/05/17, 61 y.o.   MRN: [id number]  HPI Presents for follow-up of metabolic syndrome.  Feels good.  Looking forward to retirement at the end of the year.  Wants to lose weight.  Believes he's making healthier eating choices.  Doesn't check BS at home.  Annual DDS.  Last eye exam several months ago, got new glasses. Review of Systems Occasional constipation resolves with Miralax and increased dietary fiber.    Objective:   Physical Exam  Vital signs noted. Well-developed, well nourished BM who is awake, alert and oriented, in NAD. HEENT: [place]/AT, PERRL, EOMI.  Sclera and conjunctiva are clear.  EAC are patent, TMs are normal in appearance. Nasal mucosa is pink and moist. OP is clear. Neck: supple, non-tender, no lymphadenopathy, thyromegaly. Heart: RRR, no murmur Lungs: CTA Abdomen: normo-active bowel sounds, supple, non-tender, no mass or organomegaly. Extremities: no cyanosis, clubbing or edema. Skin: warm and dry without rash.  Results for orders placed in visit on 10/25/11  GLUCOSE, POCT (MANUAL RESULT ENTRY)      Component Value Range   POC Glucose 173 (*) 70 - 99 mg/dl  POCT GLYCOSYLATED HEMOGLOBIN (HGB A1C)      Component Value Range   Hemoglobin A1C 7.9          Assessment & Plan:

## 2011-10-25 NOTE — Assessment & Plan Note (Signed)
Controlled. Continue current treatment. Continue efforts for healthier eating and exercise.

## 2011-10-25 NOTE — Assessment & Plan Note (Signed)
Continue efforts for healthier eating and exercise. Continue current treatment.

## 2011-10-25 NOTE — Assessment & Plan Note (Signed)
Increase metformin 1000 mg to BID.  Add Januvia if A1C remains above 7% at next visit. Continue efforts for healthier eating and exercise. Schedule with dentist.

## 2011-10-25 NOTE — Assessment & Plan Note (Signed)
Again discussed healthy choices he can make. Continue efforts for healthier eating and exercise.

## 2011-10-25 NOTE — Patient Instructions (Addendum)
Increase the metformin 1000 mg to twice daily. Go easy on the fruit, as it raises your blood sugar.  Increase your VEGGIES and exercise.  Keep drinking lots of water.

## 2011-10-26 ENCOUNTER — Encounter: Payer: Self-pay | Admitting: Physician Assistant

## 2011-10-26 LAB — CBC WITH DIFFERENTIAL/PLATELET
Eosinophils Relative: 3 % (ref 0–5)
HCT: 42.8 % (ref 39.0–52.0)
Hemoglobin: 14.3 g/dL (ref 13.0–17.0)
Lymphocytes Relative: 34 % (ref 12–46)
Lymphs Abs: 1.4 10*3/uL (ref 0.7–4.0)
MCV: 91.1 fL (ref 78.0–100.0)
Monocytes Absolute: 0.5 10*3/uL (ref 0.1–1.0)
Neutro Abs: 2 10*3/uL (ref 1.7–7.7)
RBC: 4.7 MIL/uL (ref 4.22–5.81)
WBC: 4 10*3/uL (ref 4.0–10.5)

## 2011-10-26 LAB — LIPID PANEL
HDL: 39 mg/dL — ABNORMAL LOW (ref >39)
LDL Cholesterol: 82 mg/dL (ref 0–99)
Total CHOL/HDL Ratio: 3.6 Ratio
Triglycerides: 96 mg/dL (ref <150)

## 2011-10-26 LAB — COMPREHENSIVE METABOLIC PANEL
Alkaline Phosphatase: 66 U/L (ref 39–117)
Creat: 0.93 mg/dL (ref 0.50–1.35)
Glucose, Bld: 151 mg/dL — ABNORMAL HIGH (ref 70–99)
Sodium: 136 mEq/L (ref 135–145)
Total Bilirubin: 0.5 mg/dL (ref 0.3–1.2)
Total Protein: 6.8 g/dL (ref 6.0–8.3)

## 2012-01-04 ENCOUNTER — Ambulatory Visit (INDEPENDENT_AMBULATORY_CARE_PROVIDER_SITE_OTHER): Payer: BC Managed Care – PPO | Admitting: Family Medicine

## 2012-01-04 VITALS — BP 142/88 | HR 81 | Temp 97.8°F | Resp 20 | Ht 63.5 in | Wt 302.0 lb

## 2012-01-04 DIAGNOSIS — Z8546 Personal history of malignant neoplasm of prostate: Secondary | ICD-10-CM | POA: Insufficient documentation

## 2012-01-04 DIAGNOSIS — N509 Disorder of male genital organs, unspecified: Secondary | ICD-10-CM

## 2012-01-04 DIAGNOSIS — IMO0001 Reserved for inherently not codable concepts without codable children: Secondary | ICD-10-CM

## 2012-01-04 DIAGNOSIS — C61 Malignant neoplasm of prostate: Secondary | ICD-10-CM

## 2012-01-04 LAB — POCT UA - MICROSCOPIC ONLY
Bacteria, U Microscopic: NEGATIVE
Casts, Ur, LPF, POC: NEGATIVE
Crystals, Ur, HPF, POC: NEGATIVE
Epithelial cells, urine per micros: NEGATIVE
WBC, Ur, HPF, POC: NEGATIVE
Yeast, UA: NEGATIVE

## 2012-01-04 LAB — POCT URINALYSIS DIPSTICK
Bilirubin, UA: NEGATIVE
Blood, UA: NEGATIVE
Glucose, UA: 100
Ketones, UA: NEGATIVE
Leukocytes, UA: NEGATIVE
Nitrite, UA: NEGATIVE
Protein, UA: 30
Spec Grav, UA: 1.02
Urobilinogen, UA: >=8
pH, UA: 6.5

## 2012-01-04 MED ORDER — TRAMADOL HCL 50 MG PO TABS: 50.0000 mg | ORAL_TABLET | Freq: Three times a day (TID) | ORAL | Status: AC | PRN

## 2012-01-04 MED ORDER — CIPROFLOXACIN HCL 500 MG PO TABS: 500.0000 mg | ORAL_TABLET | Freq: Two times a day (BID) | ORAL | Status: AC

## 2012-01-04 MED ORDER — TAMSULOSIN HCL 0.4 MG PO CAPS: 0.4000 mg | ORAL_CAPSULE | Freq: Every day | ORAL | Status: DC

## 2012-01-04 NOTE — Progress Notes (Signed)
61 yo man with left sided tightness in groin and into testicle.  H/O kidney stones x 2 in past. Pain is worse with hitting bumps in road.  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 PMHx:  Positive for prostate Ca x 5 years with seed implants:  Dr. Isabel Caprice  Objective:  NAD No Cvat Results for orders placed in visit on 01/04/12  POCT URINALYSIS DIPSTICK      Component Value Range   Color, UA amber     Clarity, UA clear     Glucose, UA 100     Bilirubin, UA neg     Ketones, UA neg     Spec Grav, UA 1.020     Blood, UA neg     pH, UA 6.5     Protein, UA 30     Urobilinogen, UA >=8.0     Nitrite, UA neg     Leukocytes, UA Negative    POCT UA - MICROSCOPIC ONLY      Component Value Range   WBC, Ur, HPF, POC neg     RBC, urine, microscopic 0-1     Bacteria, U Microscopic neg     Mucus, UA trace     Epithelial cells, urine per micros neg     Crystals, Ur, HPF, POC neg     Casts, Ur, LPF, POC neg     Yeast, UA neg       1. Groin pain, left lower quadrant  POCT urinalysis dipstick, POCT UA - Microscopic Only, ciprofloxacin (CIPRO) 500 MG tablet, Tamsulosin HCl (FLOMAX) 0.4 MG CAPS  2. Prostate cancer

## 2012-01-06 ENCOUNTER — Ambulatory Visit (INDEPENDENT_AMBULATORY_CARE_PROVIDER_SITE_OTHER): Payer: BC Managed Care – PPO | Admitting: Family Medicine

## 2012-01-06 VITALS — BP 132/68 | HR 82 | Temp 98.3°F | Resp 16 | Ht 63.0 in | Wt 302.0 lb

## 2012-01-06 DIAGNOSIS — N200 Calculus of kidney: Secondary | ICD-10-CM

## 2012-01-06 DIAGNOSIS — R300 Dysuria: Secondary | ICD-10-CM

## 2012-01-06 LAB — POCT URINALYSIS DIPSTICK
Bilirubin, UA: NEGATIVE
Glucose, UA: NEGATIVE
Ketones, UA: NEGATIVE
Leukocytes, UA: NEGATIVE
Nitrite, UA: NEGATIVE
Protein, UA: 30
Spec Grav, UA: 1.015
Urobilinogen, UA: 2
pH, UA: 7

## 2012-01-06 LAB — POCT UA - MICROSCOPIC ONLY
Casts, Ur, LPF, POC: NEGATIVE
Crystals, Ur, HPF, POC: NEGATIVE
Epithelial cells, urine per micros: NEGATIVE
Mucus, UA: NEGATIVE
WBC, Ur, HPF, POC: NEGATIVE
Yeast, UA: NEGATIVE

## 2012-01-06 NOTE — Progress Notes (Signed)
61 yo man with left sided tightness in groin and into testicle starting 4 days ago. H/O kidney stones x 2 in past.  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   PMHx: Positive for prostate Ca x 5 years with seed implants: Dr. Isabel Caprice  Objective:  NAD No CVAT  Assessment: resolving ureteral colic  Plan:  Continue current meds.

## 2012-01-08 ENCOUNTER — Other Ambulatory Visit: Payer: Self-pay | Admitting: Physician Assistant

## 2012-01-31 ENCOUNTER — Encounter: Payer: Self-pay | Admitting: Physician Assistant

## 2012-01-31 ENCOUNTER — Ambulatory Visit (INDEPENDENT_AMBULATORY_CARE_PROVIDER_SITE_OTHER): Payer: BC Managed Care – PPO | Admitting: Physician Assistant

## 2012-01-31 VITALS — BP 130/80 | HR 58 | Temp 98.0°F | Resp 16 | Wt 303.0 lb

## 2012-01-31 DIAGNOSIS — Z8546 Personal history of malignant neoplasm of prostate: Secondary | ICD-10-CM

## 2012-01-31 DIAGNOSIS — M1990 Unspecified osteoarthritis, unspecified site: Secondary | ICD-10-CM

## 2012-01-31 DIAGNOSIS — R32 Unspecified urinary incontinence: Secondary | ICD-10-CM

## 2012-01-31 DIAGNOSIS — Z23 Encounter for immunization: Secondary | ICD-10-CM

## 2012-01-31 DIAGNOSIS — M129 Arthropathy, unspecified: Secondary | ICD-10-CM

## 2012-01-31 DIAGNOSIS — E785 Hyperlipidemia, unspecified: Secondary | ICD-10-CM

## 2012-01-31 DIAGNOSIS — I10 Essential (primary) hypertension: Secondary | ICD-10-CM

## 2012-01-31 LAB — COMPREHENSIVE METABOLIC PANEL
AST: 20 U/L (ref 0–37)
Albumin: 4.1 g/dL (ref 3.5–5.2)
Alkaline Phosphatase: 63 U/L (ref 39–117)
BUN: 18 mg/dL (ref 6–23)
Calcium: 9.3 mg/dL (ref 8.4–10.5)
Chloride: 99 mEq/L (ref 96–112)
Glucose, Bld: 181 mg/dL — ABNORMAL HIGH (ref 70–99)
Potassium: 4.6 mEq/L (ref 3.5–5.3)
Sodium: 133 mEq/L — ABNORMAL LOW (ref 135–145)
Total Protein: 6.5 g/dL (ref 6.0–8.3)

## 2012-01-31 LAB — LIPID PANEL
HDL: 34 mg/dL — ABNORMAL LOW (ref >39)
LDL Cholesterol: 91 mg/dL (ref 0–99)
Triglycerides: 137 mg/dL (ref <150)

## 2012-01-31 MED ORDER — MELOXICAM 15 MG PO TABS: 15.0000 mg | ORAL_TABLET | Freq: Every day | ORAL | Status: DC

## 2012-01-31 MED ORDER — PRAVASTATIN SODIUM 40 MG PO TABS: 40.0000 mg | ORAL_TABLET | Freq: Every day | ORAL | Status: DC

## 2012-01-31 MED ORDER — LISINOPRIL 10 MG PO TABS: 10.0000 mg | ORAL_TABLET | Freq: Every day | ORAL | Status: DC

## 2012-01-31 NOTE — Assessment & Plan Note (Signed)
HTN under good control.  Will continue with current therapy.

## 2012-01-31 NOTE — Progress Notes (Signed)
  Subjective:    Patient ID: Rodney Walsh, male    DOB: 01/22/1951, 61 y.o.   MRN: [id number]  HPI  61 yr old male here for 3 month follow-up for DM, HTN, Hyperlipidemia.  Pt states overall he is feeling well.  A couple days ago he states he was feeling some lightheadedness when he stood up too quickly, but he states this has resolved and he is not concerned about it.  He is not checking his blood sugars regularly at home.  He last checked several weeks ago, and blood sugar was in 120s.  He went on vacation in mid-August and did not take his medication for a full week, but states he is back on track now.  Additionally he states that he is having worsening problems with urinary incontinence.  He sees his urologist next week.    Review of Systems  Constitutional: Negative.   HENT: Negative.   Eyes: Negative.   Respiratory: Negative.   Cardiovascular: Negative.   Gastrointestinal: Negative.   Genitourinary: Positive for urgency and enuresis.  Neurological: Negative.        Objective:   Physical Exam  Constitutional: He is oriented to person, place, and time. He appears well-developed and well-nourished.  HENT:  Head: Normocephalic and atraumatic.  Right Ear: External ear normal.  Left Ear: External ear normal.  Mouth/Throat: Oropharynx is clear and moist.  Eyes: Conjunctivae normal and EOM are normal. Pupils are equal, round, and reactive to light.  Neck: Neck supple. No JVD present. No thyromegaly present.  Cardiovascular: Normal rate, regular rhythm, normal heart sounds and intact distal pulses.   Pulmonary/Chest: Breath sounds normal. He has no wheezes. He has no rales.  Abdominal: Soft. Bowel sounds are normal. There is no tenderness. There is no guarding.  Lymphadenopathy:    He has no cervical adenopathy.  Neurological: He is alert and oriented to person, place, and time.  Skin: Skin is warm and dry. No rash noted. No erythema.     Results for orders placed in visit on  01/31/12  GLUCOSE, POCT (MANUAL RESULT ENTRY)      Component Value Range   POC Glucose 206 (*) 70 - 99 mg/dl  POCT GLYCOSYLATED HEMOGLOBIN (HGB A1C)      Component Value Range   Hemoglobin A1C 8.9          Assessment & Plan:   1. Type II or unspecified type diabetes mellitus without mention of complication, uncontrolled  POCT glucose (manual entry), POCT glycosylated hemoglobin (Hb A1C), Comprehensive metabolic panel, Lipid panel, Microalbumin, urine  2. HTN (hypertension)  Comprehensive metabolic panel, lisinopril (PRINIVIL,ZESTRIL) 10 MG tablet  3. Other and unspecified hyperlipidemia  Lipid panel, pravastatin (PRAVACHOL) 40 MG tablet  4. Morbid obesity    5. Urinary incontinence    6. Need for prophylactic vaccination and inoculation against influenza  Flu vaccine greater than or equal to 3yo preservative free IM  7. History of prostate cancer  PSA  8. Arthritis  meloxicam (MOBIC) 15 MG tablet   Continue current medication regimen.  Encouraged compliance with diabetes management to reduce long-term complications.  Patient will follow-up in 3 months, sooner if new concerns arise.

## 2012-01-31 NOTE — Patient Instructions (Addendum)
Diabetes Meal Planning Guide The diabetes meal planning guide is a tool to help you plan your meals and snacks. It is important for people with diabetes to manage their blood glucose (sugar) levels. Choosing the right foods and the right amounts throughout your day will help control your blood glucose. Eating right can even help you improve your blood pressure and reach or maintain a healthy weight. CARBOHYDRATE COUNTING MADE EASY When you eat carbohydrates, they turn to sugar. This raises your blood glucose level. Counting carbohydrates can help you control this level so you feel better. When you plan your meals by counting carbohydrates, you can have more flexibility in what you eat and balance your medicine with your food intake. Carbohydrate counting simply means adding up the total amount of carbohydrate grams in your meals and snacks. Try to eat about the same amount at each meal. Foods with carbohydrates are listed below. Each portion below is 1 carbohydrate serving or 15 grams of carbohydrates. Ask your dietician how many grams of carbohydrates you should eat at each meal or snack. Grains and Starches  1 slice bread.    English muffin or hotdog/hamburger bun.    cup cold cereal (unsweetened).   ? cup cooked pasta or rice.    cup starchy vegetables (corn, potatoes, peas, beans, winter squash).   1 tortilla (6 inches).    bagel.   1 waffle or pancake (size of a CD).    cup cooked cereal.   4 to 6 small crackers.  *Whole grain is recommended. Fruit  1 cup fresh unsweetened berries, melon, papaya, pineapple.   1 small fresh fruit.    banana or mango.    cup fruit juice (4 oz unsweetened).    cup canned fruit in natural juice or water.   2 tbs dried fruit.   12 to 15 grapes or cherries.  Milk and Yogurt  1 cup fat-free or 1% milk.   1 cup soy milk.   6 oz light yogurt with sugar-free sweetener.   6 oz low-fat soy yogurt.   6 oz plain yogurt.   Vegetables  1 cup raw or  cup cooked is counted as 0 carbohydrates or a "free" food.   If you eat 3 or more servings at 1 meal, count them as 1 carbohydrate serving.  Other Carbohydrates   oz chips or pretzels.    cup ice cream or frozen yogurt.    cup sherbet or sorbet.   2 inch square cake, no frosting.   1 tbs honey, sugar, jam, jelly, or syrup.   2 small cookies.   3 squares of graham crackers.   3 cups popcorn.   6 crackers.   1 cup broth-based soup.   Count 1 cup casserole or other mixed foods as 2 carbohydrate servings.   Foods with less than 20 calories in a serving may be counted as 0 carbohydrates or a "free" food.  You may want to purchase a book or computer software that lists the carbohydrate gram counts of different foods. In addition, the nutrition facts panel on the labels of the foods you eat are a good source of this information. The label will tell you how big the serving size is and the total number of carbohydrate grams you will be eating per serving. Divide this number by 15 to obtain the number of carbohydrate servings in a portion. Remember, 1 carbohydrate serving equals 15 grams of carbohydrate. SERVING SIZES Measuring foods and serving sizes helps you   make sure you are getting the right amount of food. The list below tells how big or small some common serving sizes are.  1 oz.........4 stacked dice.   3 oz........Marland KitchenDeck of cards.   1 tsp.......Marland KitchenTip of little finger.   1 tbs......Marland KitchenMarland KitchenThumb.   2 tbs.......Marland KitchenGolf ball.    cup......Marland KitchenHalf of a fist.   1 cup.......Marland KitchenA fist.  SAMPLE DIABETES MEAL PLAN Below is a sample meal plan that includes foods from the grain and starches, dairy, vegetable, fruit, and meat groups. A dietician can individualize a meal plan to fit your calorie needs and tell you the number of servings needed from each food group. However, controlling the total amount of carbohydrates in your meal or snack is more important than  making sure you include all of the food groups at every meal. You may interchange carbohydrate containing foods (dairy, starches, and fruits). The meal plan below is an example of a 2000 calorie diet using carbohydrate counting. This meal plan has 17 carbohydrate servings. Breakfast  1 cup oatmeal (2 carb servings).    cup light yogurt (1 carb serving).   1 cup blueberries (1 carb serving).    cup almonds.  Snack  1 large apple (2 carb servings).   1 low-fat string cheese stick.  Lunch  Chicken breast salad.   1 cup spinach.    cup chopped tomatoes.   2 oz chicken breast, sliced.   2 tbs low-fat [place] dressing.   12 whole-wheat crackers (2 carb servings).   12 to 15 grapes (1 carb serving).   1 cup low-fat milk (1 carb serving).  Snack  1 cup carrots.    cup hummus (1 carb serving).  Dinner  3 oz broiled salmon.   1 cup brown rice (3 carb servings).  Snack  1  cups steamed broccoli (1 carb serving) drizzled with 1 tsp olive oil and lemon juice.   1 cup light pudding (2 carb servings).  DIABETES MEAL PLANNING WORKSHEET Your dietician can use this worksheet to help you decide how many servings of foods and what types of foods are right for you.  BREAKFAST Food Group and Servings / Carb Servings Grain/Starches __________________________________ Dairy __________________________________________ Vegetable ______________________________________ Fruit ___________________________________________ Meat __________________________________________ Fat ____________________________________________ LUNCH Food Group and Servings / Carb Servings Grain/Starches ___________________________________ Dairy ___________________________________________ Fruit ____________________________________________ Meat ___________________________________________ Fat _____________________________________________ Laural Golden Food Group and Servings / Carb Servings Grain/Starches  ___________________________________ Dairy ___________________________________________ Fruit ____________________________________________ Meat ___________________________________________ Fat _____________________________________________ SNACKS Food Group and Servings / Carb Servings Grain/Starches ___________________________________ Dairy ___________________________________________ Vegetable _______________________________________ Fruit ____________________________________________ Meat ___________________________________________ Fat _____________________________________________ DAILY TOTALS Starches _________________________ Vegetable ________________________ Fruit ____________________________ Dairy ____________________________ Meat ____________________________ Fat ______________________________ Document Released: 01/20/2005 Document Revised: 04/14/2011 Document Reviewed: 12/01/2008 [employer] Patient Information 2012 [employer].  YOU MUST MUST MUST do better with your diabetes control.  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

## 2012-01-31 NOTE — Progress Notes (Signed)
I have examined this patient along with Ms. Debbra Riding and agree.

## 2012-01-31 NOTE — Assessment & Plan Note (Addendum)
Diabetes is uncontrolled today.  Pt has been noncompliant with medication while on vacation.  Pt does not check blood sugars regularly.  Reiterated the importance of maintaining tight glycemic control in order to keep his job with [employer].  Given pt education materials on diabetes and meal planning.  Will return in 3 months for follow up.

## 2012-02-01 ENCOUNTER — Encounter: Payer: Self-pay | Admitting: Physician Assistant

## 2012-02-01 LAB — MICROALBUMIN, URINE: Microalb, Ur: 1.65 mg/dL (ref 0.00–1.89)

## 2012-04-08 ENCOUNTER — Encounter: Payer: Self-pay | Admitting: Internal Medicine

## 2012-04-08 DIAGNOSIS — Z860100 Personal history of colon polyps, unspecified: Secondary | ICD-10-CM | POA: Insufficient documentation

## 2012-04-10 ENCOUNTER — Encounter: Payer: Self-pay | Admitting: Internal Medicine

## 2012-05-08 ENCOUNTER — Other Ambulatory Visit: Payer: Self-pay | Admitting: Physician Assistant

## 2012-05-22 ENCOUNTER — Encounter: Payer: Self-pay | Admitting: Physician Assistant

## 2012-05-22 ENCOUNTER — Ambulatory Visit (INDEPENDENT_AMBULATORY_CARE_PROVIDER_SITE_OTHER): Payer: BC Managed Care – PPO | Admitting: Physician Assistant

## 2012-05-22 VITALS — BP 133/75 | HR 62 | Temp 97.9°F | Resp 16 | Ht 63.5 in | Wt 298.0 lb

## 2012-05-22 DIAGNOSIS — E785 Hyperlipidemia, unspecified: Secondary | ICD-10-CM

## 2012-05-22 DIAGNOSIS — I10 Essential (primary) hypertension: Secondary | ICD-10-CM

## 2012-05-22 DIAGNOSIS — Z86010 Personal history of colonic polyps: Secondary | ICD-10-CM

## 2012-05-22 LAB — POCT URINALYSIS DIPSTICK
Blood, UA: NEGATIVE
Nitrite, UA: NEGATIVE
Protein, UA: NEGATIVE
Urobilinogen, UA: 1
pH, UA: 6.5

## 2012-05-22 LAB — POCT UA - MICROSCOPIC ONLY
Casts, Ur, LPF, POC: NEGATIVE
Crystals, Ur, HPF, POC: NEGATIVE
Mucus, UA: NEGATIVE
Yeast, UA: NEGATIVE

## 2012-05-22 LAB — COMPREHENSIVE METABOLIC PANEL
Albumin: 4.4 g/dL (ref 3.5–5.2)
BUN: 13 mg/dL (ref 6–23)
Calcium: 9.7 mg/dL (ref 8.4–10.5)
Chloride: 97 mEq/L (ref 96–112)
Creat: 1 mg/dL (ref 0.50–1.35)
Glucose, Bld: 325 mg/dL — ABNORMAL HIGH (ref 70–99)
Potassium: 4.8 mEq/L (ref 3.5–5.3)

## 2012-05-22 LAB — LIPID PANEL
HDL: 39 mg/dL — ABNORMAL LOW (ref >39)
Triglycerides: 151 mg/dL — ABNORMAL HIGH (ref <150)

## 2012-05-22 MED ORDER — GLIPIZIDE 5 MG PO TABS: 5.0000 mg | ORAL_TABLET | Freq: Two times a day (BID) | ORAL | Status: DC

## 2012-05-22 NOTE — Assessment & Plan Note (Addendum)
Remarkably well controlled despite lack of effort for healthy lifestyle and weight loss.  Continue current medication regimen.  Increase efforts for healthy eating choices and regular exercise.

## 2012-05-22 NOTE — Progress Notes (Signed)
Subjective:    Patient ID: Rodney Walsh, male    DOB: [birth year], 62 y.o.   MRN: [id number]  HPI This 62 y.o. male presents for evaluation of DM type 2, HTN, hyperlipidemia, obesity.  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  He reports he's been working hard to make better choices about eating.  Not exercising.  He's pleased with his 5 pound weight loss since his last visit.  Frequency of home glucose monitoring: doesn't test Sees a dentist annually, eye specialist annually. Checks feet weekly. Is current with influenza vaccine. Is current with pneumococcal vaccine.  Past Medical History  Diagnosis Date  . Hyperlipidemia   . Hypertension   . Diabetes mellitus without complication   . Prostate cancer     Past Surgical History  Procedure Date  . Prostate surgery     with seed placement    Prior to Admission medications   Medication Sig Start Date End Date Taking? Authorizing Provider  aspirin 81 MG tablet Take 81 mg by mouth daily.   Yes Historical Provider, MD  furosemide (LASIX) 40 MG tablet TAKE ONE TABLET BY MOUTH IN THE MORNING 05/08/12  Yes Eleanore E Egan, PA-C  KLOR-CON M20 20 MEQ tablet TAKE ONE TABLET BY MOUTH DAILY. 09/11/11  Yes Jaxton Casale S Skip Litke, PA-C  lisinopril (PRINIVIL,ZESTRIL) 10 MG tablet Take 1 tablet (10 mg total) by mouth daily. 01/31/12  Yes Pantelis Elgersma S Soliyana Mcchristian, PA-C  meloxicam (MOBIC) 15 MG tablet Take 1 tablet (15 mg total) by mouth daily. 01/31/12 01/30/13 Yes Marialy Urbanczyk S Hanson Medeiros, PA-C  metFORMIN (GLUCOPHAGE) 1000 MG tablet Take 1 tablet (1,000 mg total) by mouth 2 (two) times daily with a meal. 10/25/11  Yes Nobie Alleyne S Kevonna Nolte, PA-C  Multiple Vitamins-Minerals (MULTIVITAMIN PO) Take 1 tablet by mouth daily.   Yes Historical Provider, MD  oxybutynin (DITROPAN) 5 MG tablet Take 1 tablet (5 mg total) by mouth 2 (two) times daily. 06/16/11 06/15/12 Yes Eilee Schader S Olusegun Gerstenberger, PA-C  pravastatin (PRAVACHOL) 40 MG tablet Take 1 tablet (40 mg  total) by mouth daily. 01/31/12  Yes Avrohom Mckelvin S Christobal Morado, PA-C    No Known Allergies  History   Social History  . Marital Status: Married    Spouse Name: Deb    Number of Children: 2  . Years of Education: 12   Occupational History  . BUS DRIVER [employer]   Social History Main Topics  . Smoking status: Former Smoker -- 1.0 packs/day    Types: Cigarettes    Quit date: 08/10/2011  . Smokeless tobacco: Never Used  . Alcohol Use: No  . Drug Use: No  . Sexually Active: Yes -- Male partner(s)   Other Topics Concern  . Not on file   Social History Narrative   Lives with his wife, and their two younger children, Erin and Holiday representative.    Family History  Problem Relation Age of Onset  . Cancer Mother     uterine  . Alzheimer's disease Father   . Hypertension Sister   . Diabetes Brother   . Renal Disease Brother   . Heart disease Brother   . Diabetes Daughter   . Seizures Daughter   . Cancer Sister     ?uterine  . Hypertension Sister   . Diabetes Sister   . Hyperlipidemia Sister   . Hypertension Sister      Review of Systems Denies chest pain, shortness of breath, HA, dizziness, vision change, nausea, vomiting, diarrhea,  constipation, melena, hematochezia, dysuria, increased urinary urgency or frequency, increased hunger or thirst, unintentional weight change, unexplained myalgias or arthralgias, rash.     Objective:   Physical Exam  Vitals reviewed. Constitutional: He is oriented to person, place, and time. Vital signs are normal. He appears well-developed and well-nourished. No distress.  HENT:  Head: Normocephalic and atraumatic.  Right Ear: Hearing normal.  Left Ear: Hearing normal.  Eyes: EOM are normal. Pupils are equal, round, and reactive to light.  Neck: Normal range of motion. Neck supple. No thyromegaly present.  Cardiovascular: Normal rate, regular rhythm and normal heart sounds.   Pulses:      Radial pulses are 2+ on the right side, and 2+  on the left side.       Dorsalis pedis pulses are 2+ on the right side, and 2+ on the left side.       Posterior tibial pulses are 2+ on the right side, and 2+ on the left side.  Pulmonary/Chest: Effort normal and breath sounds normal.  Lymphadenopathy:       Head (right side): No tonsillar, no preauricular, no posterior auricular and no occipital adenopathy present.       Head (left side): No tonsillar, no preauricular, no posterior auricular and no occipital adenopathy present.    He has no cervical adenopathy.       Right: No supraclavicular adenopathy present.       Left: No supraclavicular adenopathy present.  Neurological: He is alert and oriented to person, place, and time. No sensory deficit.  Skin: Skin is warm, dry and intact. No rash noted. No cyanosis or erythema. Nails show no clubbing.  Psychiatric: He has a normal mood and affect.   See DM foot exam.   Results for orders placed in visit on 05/22/12  GLUCOSE, POCT (MANUAL RESULT ENTRY)      Component Value Range   POC Glucose 346 (*) 70 - 99 mg/dl  POCT GLYCOSYLATED HEMOGLOBIN (HGB A1C)      Component Value Range   Hemoglobin A1C 10.0    POCT UA - MICROSCOPIC ONLY      Component Value Range   WBC, Ur, HPF, POC 2-4     RBC, urine, microscopic 0-3     Bacteria, U Microscopic trace     Mucus, UA neg     Epithelial cells, urine per micros 0-1     Crystals, Ur, HPF, POC neg     Casts, Ur, LPF, POC neg     Yeast, UA neg    POCT URINALYSIS DIPSTICK      Component Value Range   Color, UA yellow     Clarity, UA clear     Glucose, UA 1000     Bilirubin, UA neg     Ketones, UA neg     Spec Grav, UA 1.015     Blood, UA neg     pH, UA 6.5     Protein, UA neg     Urobilinogen, UA 1.0     Nitrite, UA neg     Leukocytes, UA Negative         Assessment & Plan:   1. Type II or unspecified type diabetes mellitus without mention of complication, uncontrolled  POCT glucose (manual entry), POCT glycosylated hemoglobin (Hb  A1C), Comprehensive metabolic panel, glipiZIDE (GLUCOTROL) 5 MG tablet  2. HTN (hypertension)  Comprehensive metabolic panel, POCT UA - Microscopic Only, POCT urinalysis dipstick  3. Other and unspecified hyperlipidemia  Comprehensive  metabolic panel, Lipid panel  4. Morbid obesity    5. 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  He was aware that if there was not improvement I would not renew his card.  At the time, he was not concerned, as he planned to retire this winter.  Unfortunately, hi E's having to work an additional 6 months due to medical leave taken a couple of years ago.

## 2012-05-22 NOTE — Assessment & Plan Note (Signed)
While he is pleased that he's lost 5 lbs in the last 3 months, I suspect that is due to the elevated glucose he's had rather than any significant change in his lifestyle.  He was disappointed to hear that, but seemed to hear me a little.  He still doesn't seem to take it seriously, the significant impact his weight is having on his health.  Today he reports he's increased his fruit intake-all canned-and asks if he needs to rinse the fruit if it's canned with Lite rather than heavy syrup. His wife has seen a nutritionist and been successful at changing her eating habits, but Rodney Walsh doesn't eat what Deb prepares, often eating pre-packed snack foods. Again, he's counseled on healthier choices.

## 2012-05-22 NOTE — Assessment & Plan Note (Signed)
Completely uncontrolled.  Added glipizide 5 mg 1 PO BID.  Recheck next week. 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  I'd like him to be on Victoza or Onglyza instead, and we will move to that on short order.  He may also benefit from [facility] from both a glucose and cholesterol standpoint.  This patient has long been non-compliant with recommendations regarding exercise and eating habits, and not interested in nutrition counseling.  He doesn't take the potential consequences of his uncontrolled diabetes and morbid obesity seriously.    Patient Instructions  Check your blood sugar twice each day (once when you haven't had anything to eat or drink for 8-12 hours, and once 2-3 hours after your largest meal of the day). Record the readings and bring them with you when you come to see me next week. Write down EVERY SINGLE THING you put in your mouth, and bring the list with you next week. Drink at least 64 ounces of water every day. Walk, or do another physical exercise for at least 30 minutes EVERY SINGLE DAY. REDUCE the starches and sweets that you eat.  Your eating should consist mostly of plants (go easy on the fruits) and lean proteins. Aim for no more than 1800 calories each day, but not less than 1600 calories.

## 2012-05-22 NOTE — Assessment & Plan Note (Signed)
Anticipate elevated TG, given complete lack of control of DM.  Consider Welchol, depending on lab results.  Counseled on the need for healthier eating and regular exercise and weight loss.

## 2012-05-22 NOTE — Patient Instructions (Addendum)
Check your blood sugar twice each day (once when you haven't had anything to eat or drink for 8-12 hours, and once 2-3 hours after your largest meal of the day). Record the readings and bring them with you when you come to see me next week. Write down EVERY SINGLE THING you put in your mouth, and bring the list with you next week. Drink at least 64 ounces of water every day. Walk, or do another physical exercise for at least 30 minutes EVERY SINGLE DAY. REDUCE the starches and sweets that you eat.  Your eating should consist mostly of plants (go easy on the fruits) and lean proteins. Aim for no more than 1800 calories each day, but not less than 1600 calories.  1/21 1 pm-8:30 pm 1/22 9 am-1 pm

## 2012-05-23 ENCOUNTER — Encounter: Payer: Self-pay | Admitting: Physician Assistant

## 2012-05-23 ENCOUNTER — Encounter: Payer: Self-pay | Admitting: Internal Medicine

## 2012-05-29 ENCOUNTER — Ambulatory Visit (INDEPENDENT_AMBULATORY_CARE_PROVIDER_SITE_OTHER): Payer: BC Managed Care – PPO | Admitting: Physician Assistant

## 2012-05-29 VITALS — BP 135/81 | HR 70 | Temp 97.9°F | Resp 16 | Ht 64.0 in | Wt 297.4 lb

## 2012-05-29 DIAGNOSIS — IMO0001 Reserved for inherently not codable concepts without codable children: Secondary | ICD-10-CM

## 2012-05-29 NOTE — Patient Instructions (Addendum)
Check your blood sugar twice each day (once when you haven't had anything to eat or drink for 8-12 hours, and once 2-3 hours after your largest meal of the day).  Record the readings and bring them with you when you come to see me next week.  Write down EVERY SINGLE THING you put in your mouth, and bring the list with you next week.  Drink at least 64 ounces of water every day.  Walk, or do another physical exercise for at least 30 minutes EVERY SINGLE DAY.  REDUCE the starches and sweets that you eat. Your eating should consist mostly of plants (go easy on the fruits) and lean proteins.  Aim for no more than 1800 calories each day, but not less than 1600 calories.  Brush your teeth 2-3 times daily, and floss at least once each day.  Schedule with and see your dentist every 6 months.  Dental and gum disease can cause elevated blood sugar.   When you are ready, I'll refer you to see a nutritionist.

## 2012-05-29 NOTE — Progress Notes (Signed)
Subjective:    Patient ID: Rodney Walsh, male    DOB: 1951/01/06, 62 y.o.   MRN: [id number]  HPI This 62 y.o. male presents for evaluation of diabetes.  At his visit last week we discovered that his glucose was not only still not adequately controlled, but that his A1C had worsened.  We added glipizide to his regimen to get better control while he's making improvements on his eating and exercise.  He still needs to make better eating choices, but has multiple obstacles (lack of nutrional knowledge being the primary issue).  He is not interested in seeing a nutritionist until he retires in June.  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 He has done what I asked, logged his glucose and oral intake, and had no hypoglycemia with the addition of glipizide.   Current Outpatient Prescriptions on File Prior to Visit  Medication Sig Dispense Refill  . aspirin 81 MG tablet Take 81 mg by mouth daily.      . furosemide (LASIX) 40 MG tablet TAKE ONE TABLET BY MOUTH IN THE MORNING  90 tablet  0  . glipiZIDE (GLUCOTROL) 5 MG tablet Take 1 tablet (5 mg total) by mouth 2 (two) times daily before a meal.  60 tablet  0  . KLOR-CON M20 20 MEQ tablet TAKE ONE TABLET BY MOUTH DAILY.  90 each  1  . lisinopril (PRINIVIL,ZESTRIL) 10 MG tablet Take 1 tablet (10 mg total) by mouth daily.  90 tablet  1  . meloxicam (MOBIC) 15 MG tablet Take 1 tablet (15 mg total) by mouth daily.  30 tablet  5  . metFORMIN (GLUCOPHAGE) 1000 MG tablet Take 1 tablet (1,000 mg total) by mouth 2 (two) times daily with a meal.  60 tablet  2  . oxybutynin (DITROPAN) 5 MG tablet Take 1 tablet (5 mg total) by mouth 2 (two) times daily.  60 tablet  5  . pravastatin (PRAVACHOL) 40 MG tablet Take 1 tablet (40 mg total) by mouth daily.  90 tablet  1   Patient Active Problem List  Diagnosis  . Type II or unspecified type diabetes mellitus without mention of complication,  uncontrolled  . HTN (hypertension)  . Other and unspecified hyperlipidemia  . Morbid obesity  . Prostate cancer  . Personal history of adenomatous colonic polyps    Review of Systems Denies chest pain, shortness of breath, HA, dizziness, vision change, nausea, vomiting, diarrhea, constipation, melena, hematochezia, dysuria, increased urinary urgency or frequency, increased hunger or thirst, unintentional weight change, unexplained myalgias or arthralgias, rash.     Objective:   Physical Exam  Constitutional: He is oriented to person, place, and time. Vital signs are normal. He appears well-developed and well-nourished. He is active and cooperative.  Non-toxic appearance. He does not have a sickly appearance. He does not appear ill. No distress.  HENT:  Head: Normocephalic and atraumatic. No trismus in the jaw.  Right Ear: Hearing, tympanic membrane, external ear and ear canal normal.  Left Ear: Hearing, tympanic membrane, external ear and ear canal normal.  Nose: Nose normal.  Mouth/Throat: Uvula is midline, oropharynx is clear and moist and mucous membranes are normal. He does not have dentures. No oral lesions. Normal dentition. No dental abscesses, uvula swelling, lacerations or dental caries.  Eyes: Conjunctivae normal and EOM are normal. Pupils are equal, round, and reactive to light. Right eye exhibits no discharge. Left eye  exhibits no discharge. No scleral icterus.  Fundoscopic exam:      The right eye shows no arteriolar narrowing, no AV nicking, no exudate, no hemorrhage and no papilledema.       The left eye shows no arteriolar narrowing, no AV nicking, no exudate, no hemorrhage and no papilledema.  Neck: Normal range of motion, full passive range of motion without pain and phonation normal. Neck supple. No spinous process tenderness and no muscular tenderness present. No rigidity. No tracheal deviation, no edema, no erythema and normal range of motion present. No thyromegaly  present.  Cardiovascular: Normal rate, regular rhythm, S1 normal, S2 normal, normal heart sounds, intact distal pulses and normal pulses.  Exam reveals no gallop and no friction rub.   No murmur heard. Pulmonary/Chest: Effort normal and breath sounds normal. No respiratory distress. He has no wheezes. He has no rales.  Abdominal: Soft. Normal appearance and bowel sounds are normal. He exhibits no distension and no mass. There is no hepatosplenomegaly. There is no tenderness. There is no rebound and no guarding. No hernia. Hernia confirmed negative in the right inguinal area and confirmed negative in the left inguinal area.  Genitourinary: Testes normal and penis normal. Circumcised. No phimosis, paraphimosis, hypospadias, penile erythema or penile tenderness. No discharge found.  Musculoskeletal: Normal range of motion. He exhibits no edema and no tenderness.       Right shoulder: Normal.       Left shoulder: Normal.       Right elbow: Normal.      Left elbow: Normal.       Right wrist: Normal.       Left wrist: Normal.       Right hip: Normal.       Left hip: Normal.       Right knee: Normal.       Left knee: Normal.       Right ankle: Normal. Achilles tendon normal.       Left ankle: Normal. Achilles tendon normal.       Cervical back: Normal. He exhibits normal range of motion, no tenderness, no bony tenderness, no swelling, no edema, no deformity, no laceration, no pain, no spasm and normal pulse.       Thoracic back: Normal.       Lumbar back: Normal.       Right upper arm: Normal.       Left upper arm: Normal.       Right forearm: Normal.       Left forearm: Normal.       Right hand: Normal.       Left hand: Normal.       Right upper leg: Normal.       Left upper leg: Normal.       Right lower leg: Normal.       Left lower leg: Normal.       Right foot: Normal.       Left foot: Normal.  Lymphadenopathy:       Head (right side): No submental, no submandibular, no tonsillar, no  preauricular, no posterior auricular and no occipital adenopathy present.       Head (left side): No submental, no submandibular, no tonsillar, no preauricular, no posterior auricular and no occipital adenopathy present.    He has no cervical adenopathy.       Right: No inguinal and no supraclavicular adenopathy present.       Left: No inguinal and no  supraclavicular adenopathy present.  Neurological: He is alert and oriented to person, place, and time. He has normal strength and normal reflexes. He displays no tremor. No cranial nerve deficit. He exhibits normal muscle tone. Coordination and gait normal.  Skin: Skin is warm, dry and intact. No abrasion, no ecchymosis, no laceration, no lesion and no rash noted. He is not diaphoretic. No cyanosis or erythema. No pallor. Nails show no clubbing.     Psychiatric: He has a normal mood and affect. His speech is normal and behavior is normal. Judgment and thought content normal. Cognition and memory are normal.   Blood pressure 135/81, pulse 70, temperature 97.9 F (36.6 C), temperature source Oral, resp. rate 16, height 5\' 4"  (1.626 m), weight 297 lb 6.4 oz (134.9 kg), SpO2 95.00%. Body mass index is 51.05 kg/(m^2).     Assessment & Plan:   1. Type II or unspecified type diabetes mellitus without mention of complication, uncontrolled    As he has complied with my recommendations, I certified him for 3 months.  However, if there is no improvement, I will not re-certify him then.  I do not anticipate his A1C to be at goal by then, and plan to change glipizide to Victoza or Onlgyza or Januvia, in hopes of improving control.

## 2012-06-12 ENCOUNTER — Encounter: Payer: Self-pay | Admitting: Physician Assistant

## 2012-06-12 ENCOUNTER — Telehealth: Payer: Self-pay | Admitting: *Deleted

## 2012-06-12 NOTE — Telephone Encounter (Signed)
He is ok for direct  Not there until March  If he wants to do before then (ask him when seen) we can look for a date and time before that I might be able to do and adjust prep instructions over phone etc

## 2012-06-12 NOTE — Telephone Encounter (Signed)
Dr Leone Payor: pt is scheduled for recall colonoscopy at [facility] 2/19. When reviewing chart for PV scheduled for 2/5 I see pt has BMI of 51.02 as of 05/29/12.  Do you want to see this pt in office or is he okay to be scheduled at hosptial directly?  Thanks, Olegario Messier in [facility], [facility], [facility]

## 2012-06-13 ENCOUNTER — Ambulatory Visit (AMBULATORY_SURGERY_CENTER): Payer: BC Managed Care – PPO

## 2012-06-13 ENCOUNTER — Encounter: Payer: Self-pay | Admitting: Internal Medicine

## 2012-06-13 VITALS — Ht 64.0 in | Wt 303.2 lb

## 2012-06-13 DIAGNOSIS — Z1211 Encounter for screening for malignant neoplasm of colon: Secondary | ICD-10-CM

## 2012-06-13 MED ORDER — NA SULFATE-K SULFATE-MG SULF 17.5-3.13-1.6 GM/177ML PO SOLN: 1.0000 | Freq: Once | ORAL | Status: AC

## 2012-06-14 NOTE — Telephone Encounter (Signed)
Pt completed PV on 06/13/12; scheduled for colonoscopy at [facility] on 3/11.  Instructions given to pt by Doristine Church, RN

## 2012-06-26 ENCOUNTER — Telehealth: Payer: Self-pay | Admitting: *Deleted

## 2012-06-26 DIAGNOSIS — M1990 Unspecified osteoarthritis, unspecified site: Secondary | ICD-10-CM

## 2012-06-26 NOTE — Telephone Encounter (Signed)
[facility] requesting refill on mobic. Last fill 03/31/12

## 2012-06-27 ENCOUNTER — Encounter: Payer: Self-pay | Admitting: Internal Medicine

## 2012-06-27 MED ORDER — MELOXICAM 15 MG PO TABS: 15.0000 mg | ORAL_TABLET | Freq: Every day | ORAL | Status: DC

## 2012-06-27 NOTE — Telephone Encounter (Signed)
Sent to pharmacy, follow-up with Chelle as planned

## 2012-07-10 ENCOUNTER — Telehealth: Payer: Self-pay

## 2012-07-10 NOTE — Telephone Encounter (Signed)
Spoke with patient and informed him of time change in [facility] colonoscopy appointment.  It is on 07/17/12 at 10:15am, he is to arrive at 8:45 am, he verbalized understanding and will adjust his prep time.

## 2012-07-17 ENCOUNTER — Encounter: Payer: Self-pay | Admitting: Internal Medicine

## 2012-07-17 ENCOUNTER — Ambulatory Visit (HOSPITAL_COMMUNITY)
Admission: RE | Admit: 2012-07-17 | Discharge: 2012-07-17 | Disposition: A | Discharge status: Home / Self Care | Payer: BC Managed Care – PPO | Source: Ambulatory Visit | Attending: Internal Medicine | Admitting: Internal Medicine

## 2012-07-17 ENCOUNTER — Encounter (HOSPITAL_COMMUNITY): Payer: Self-pay | Admitting: *Deleted

## 2012-07-17 ENCOUNTER — Encounter (HOSPITAL_COMMUNITY)
Admission: RE | Disposition: A | Discharge status: Home / Self Care | Payer: Self-pay | Source: Ambulatory Visit | Attending: Internal Medicine

## 2012-07-17 DIAGNOSIS — E785 Hyperlipidemia, unspecified: Secondary | ICD-10-CM | POA: Insufficient documentation

## 2012-07-17 DIAGNOSIS — Z1211 Encounter for screening for malignant neoplasm of colon: Secondary | ICD-10-CM

## 2012-07-17 DIAGNOSIS — Z7982 Long term (current) use of aspirin: Secondary | ICD-10-CM | POA: Insufficient documentation

## 2012-07-17 DIAGNOSIS — Z79899 Other long term (current) drug therapy: Secondary | ICD-10-CM | POA: Insufficient documentation

## 2012-07-17 DIAGNOSIS — Z09 Encounter for follow-up examination after completed treatment for conditions other than malignant neoplasm: Secondary | ICD-10-CM | POA: Insufficient documentation

## 2012-07-17 DIAGNOSIS — I10 Essential (primary) hypertension: Secondary | ICD-10-CM | POA: Insufficient documentation

## 2012-07-17 DIAGNOSIS — Z860100 Personal history of colon polyps, unspecified: Secondary | ICD-10-CM | POA: Insufficient documentation

## 2012-07-17 DIAGNOSIS — Z8546 Personal history of malignant neoplasm of prostate: Secondary | ICD-10-CM | POA: Insufficient documentation

## 2012-07-17 DIAGNOSIS — E119 Type 2 diabetes mellitus without complications: Secondary | ICD-10-CM | POA: Insufficient documentation

## 2012-07-17 HISTORY — DX: Unspecified osteoarthritis, unspecified site: M19.90

## 2012-07-17 HISTORY — PX: COLONOSCOPY: SHX5424

## 2012-07-17 LAB — GLUCOSE, CAPILLARY: Glucose-Capillary: 263 mg/dL — ABNORMAL HIGH (ref 70–99)

## 2012-07-17 SURGERY — COLONOSCOPY
Anesthesia: Moderate Sedation

## 2012-07-17 MED ORDER — FENTANYL CITRATE 0.05 MG/ML IJ SOLN
INTRAMUSCULAR | Status: DC | PRN
  Administered 2012-07-17 (×2): 25 ug via INTRAVENOUS

## 2012-07-17 MED ORDER — MIDAZOLAM HCL 5 MG/5ML IJ SOLN
INTRAMUSCULAR | Status: DC | PRN
  Administered 2012-07-17 (×2): 2 mg via INTRAVENOUS

## 2012-07-17 MED ORDER — SODIUM CHLORIDE 0.9 % IV SOLN: INTRAVENOUS | Status: DC

## 2012-07-17 MED ORDER — DIPHENHYDRAMINE HCL 50 MG/ML IJ SOLN
INTRAMUSCULAR | Status: AC
  Filled 2012-07-17: qty 1

## 2012-07-17 MED ORDER — FENTANYL CITRATE 0.05 MG/ML IJ SOLN
INTRAMUSCULAR | Status: AC
  Filled 2012-07-17: qty 2

## 2012-07-17 MED ORDER — MIDAZOLAM HCL 10 MG/2ML IJ SOLN
INTRAMUSCULAR | Status: AC
  Filled 2012-07-17: qty 2

## 2012-07-17 NOTE — Op Note (Signed)
[facility] [address] [place], [address]   COLONOSCOPY PROCEDURE REPORT  PATIENT: Rodney Walsh, Rodney Walsh  MR#: [id number] BIRTHDATE: 17-Feb-1951 , 61  yrs. old GENDER: Male ENDOSCOPIST: Iva Boop, MD, [facility] PROCEDURE DATE:  07/17/2012 PROCEDURE:   Colonoscopy, screening ASA CLASS:   Class III INDICATIONS:Screening and surveillance,personal history of colonic polyps. MEDICATIONS: Fentanyl 50 mcg IV and Versed 4 mg IV  DESCRIPTION OF PROCEDURE:   After the risks benefits and alternatives of the procedure were thoroughly explained, informed consent was obtained.  A digital rectal exam revealed no abnormalities of the rectum, A digital rectal exam revealed the prostate was not enlarged, and A digital rectal exam revealed no prostatic nodules.   The Pentax Colonoscope [id number]  endoscope was introduced through the anus and advanced to the cecum, which was identified by both the appendix and ileocecal valve. No adverse events experienced.   The quality of the prep was Suprep excellent The instrument was then slowly withdrawn as the colon was fully examined.      COLON FINDINGS: Moderate diverticulosis was noted in the sigmoid colon.   The colon mucosa was otherwise normal.  Retroflexed views revealed no abnormalities. The time to cecum=2 minutes 0 seconds. Withdrawal time=8 minutes 0 seconds.  The scope was withdrawn and the procedure completed. COMPLICATIONS: There were no complications.  ENDOSCOPIC IMPRESSION: 1.   Moderate diverticulosis was noted in the sigmoid colon 2.   The colon mucosa was otherwise normal - excellent prep 3.    Personal hx adenoma 2009  RECOMMENDATIONS: Repeat Colonscopy in 10 years.   eSigned:  Iva Boop, MD, [facility] 07/17/2012 11:09 AM   cc: Theora Gianotti, PA and The Patient

## 2012-07-17 NOTE — H&P (Signed)
[facility]    Primary Care Physician:  JEFFERY,CHELLE, PA-C Primary Gastroenterologist:  Dr.Gessner    Assessment:  Colon cancer screening and polyp surveillance, hx adenomas 2009    Recommendations: Colonoscopy - The risks and benefits as well as alternatives of endoscopic procedure(s) have been discussed and reviewed. All questions answered. The patient agrees to proceed.      HPI: Rodney Walsh is a 62 y.o. male here for follow-up/routine colonoscopy.No active GI problems.     Past Medical History  Diagnosis Date  . Hyperlipidemia   . Hypertension   . Diabetes mellitus without complication   . Prostate cancer   . Arthritis     left shoulder and left knee    Past Surgical History  Procedure Laterality Date  . Prostate surgery      with seed placement  . Back surgery      Prior to Admission medications   Medication Sig Start Date End Date Taking? Authorizing Provider  aspirin 81 MG tablet Take 81 mg by mouth daily.   Yes Historical Provider, MD  furosemide (LASIX) 40 MG tablet TAKE ONE TABLET BY MOUTH IN THE MORNING 05/08/12  Yes Eleanore E Egan, PA-C  glipiZIDE (GLUCOTROL) 5 MG tablet Take 1 tablet (5 mg total) by mouth 2 (two) times daily before a meal. 05/22/12  Yes Chelle S Jeffery, PA-C  KLOR-CON M20 20 MEQ tablet TAKE ONE TABLET BY MOUTH DAILY. 09/11/11  Yes Chelle S Jeffery, PA-C  lisinopril (PRINIVIL,ZESTRIL) 10 MG tablet Take 1 tablet (10 mg total) by mouth daily. 01/31/12  Yes Chelle S Jeffery, PA-C  meloxicam (MOBIC) 15 MG tablet Take 1 tablet (15 mg total) by mouth daily. 06/27/12 06/27/13 Yes Eleanore Delia Chimes, PA-C  metFORMIN (GLUCOPHAGE) 1000 MG tablet Take 1 tablet (1,000 mg total) by mouth 2 (two) times daily with a meal. 10/25/11  Yes Chelle S Jeffery, PA-C  pravastatin (PRAVACHOL) 40 MG tablet Take 1 tablet (40 mg total) by mouth daily. 01/31/12  Yes Chelle S Jeffery, PA-C  oxybutynin (DITROPAN) 5 MG tablet Take 1 tablet (5 mg total) by  mouth 2 (two) times daily. 06/16/11 06/15/12  Fernande Bras, PA-C    Current Facility-Administered Medications  Medication Dose Route Frequency Provider Last Rate Last Dose  . 0.9 %  sodium chloride infusion   Intravenous Continuous Iva Boop, MD        Allergies as of 06/13/2012  . (No Known Allergies)    Family History  Problem Relation Age of Onset  . Cancer Mother     uterine  . Alzheimer's disease Father   . Hypertension Sister   . Diabetes Brother   . Renal Disease Brother   . Heart disease Brother   . Diabetes Daughter   . Seizures Daughter   . Cancer Sister     ?uterine  . Hypertension Sister   . Diabetes Sister   . Hyperlipidemia Sister   . Hypertension Sister   . Colon cancer Neg Hx     History   Social History  . Marital Status: Married    Spouse Name: Deb    Number of Children: 2  . Years of Education: 12   Occupational History  . BUS DRIVER [employer]   Social History Main Topics  . Smoking status: Former Smoker -- 1.00 packs/day    Types: Cigarettes    Quit date: 08/10/2011  . Smokeless tobacco: Never Used  . Alcohol Use: No  . Drug Use: No  .  Sexually Active: Yes -- Male partner(s)   Other Topics Concern  . Not on file   Social History Narrative   Lives with his wife, and their two younger children, Erin and Holiday representative.    Review of Systems: as mentioned in the HPI.  Physical Exam: Vital signs in last 24 hours: Temp:  [98.2 F (36.8 C)] 98.2 F (36.8 C) (03/11 0937) Resp:  [25] 25 (03/11 0937) BP: (158)/(90) 158/90 mmHg (03/11 0937) SpO2:  [97 %] 97 % (03/11 0937) Weight:  [274 lb (124.286 kg)] 274 lb (124.286 kg) (03/11 0937)   General:   Alert,  Well-developed, well-nourished, pleasant and cooperative in NAD Eyes:  Sclera clear, no icterus.   Conjunctiva pink. Mouth:  dentures Lungs:  Clear throughout to auscultation.   Heart:  Regular rate and rhythm; no murmurs, clicks, rubs,  or gallops. Abdomen:  Obese,  Soft, nontender and nondistended. No masses, hepatosplenomegaly but small umbilical hernia    Psych:  Alert and cooperative. Normal mood and affect.  Intake/Output from previous day:        LOS: 0 days      @Carl  Sena Slate, MD, [facility] [phone] (pager) 07/17/2012 10:18 AM@

## 2012-07-18 ENCOUNTER — Encounter (HOSPITAL_COMMUNITY): Payer: Self-pay | Admitting: Internal Medicine

## 2012-08-01 ENCOUNTER — Other Ambulatory Visit: Payer: Self-pay | Admitting: Physician Assistant

## 2012-08-07 DIAGNOSIS — Z0271 Encounter for disability determination: Secondary | ICD-10-CM

## 2012-08-10 ENCOUNTER — Ambulatory Visit (INDEPENDENT_AMBULATORY_CARE_PROVIDER_SITE_OTHER): Payer: BC Managed Care – PPO | Admitting: Physician Assistant

## 2012-08-10 VITALS — BP 132/90 | HR 65 | Temp 98.4°F | Resp 18 | Ht 63.25 in | Wt 289.6 lb

## 2012-08-10 DIAGNOSIS — G4733 Obstructive sleep apnea (adult) (pediatric): Secondary | ICD-10-CM

## 2012-08-10 DIAGNOSIS — E785 Hyperlipidemia, unspecified: Secondary | ICD-10-CM

## 2012-08-10 DIAGNOSIS — E559 Vitamin D deficiency, unspecified: Secondary | ICD-10-CM

## 2012-08-10 DIAGNOSIS — E119 Type 2 diabetes mellitus without complications: Secondary | ICD-10-CM

## 2012-08-10 DIAGNOSIS — N529 Male erectile dysfunction, unspecified: Secondary | ICD-10-CM

## 2012-08-10 DIAGNOSIS — G4700 Insomnia, unspecified: Secondary | ICD-10-CM

## 2012-08-10 DIAGNOSIS — I10 Essential (primary) hypertension: Secondary | ICD-10-CM

## 2012-08-10 DIAGNOSIS — L309 Dermatitis, unspecified: Secondary | ICD-10-CM | POA: Insufficient documentation

## 2012-08-10 LAB — COMPREHENSIVE METABOLIC PANEL
Albumin: 4.7 g/dL (ref 3.5–5.2)
CO2: 26 mEq/L (ref 19–32)
Calcium: 9.6 mg/dL (ref 8.4–10.5)
Glucose, Bld: 247 mg/dL — ABNORMAL HIGH (ref 70–99)
Potassium: 4.7 mEq/L (ref 3.5–5.3)
Sodium: 136 mEq/L (ref 135–145)
Total Protein: 7 g/dL (ref 6.0–8.3)

## 2012-08-10 LAB — LIPID PANEL
Cholesterol: 128 mg/dL (ref 0–200)
Triglycerides: 93 mg/dL (ref <150)

## 2012-08-10 MED ORDER — METFORMIN HCL 1000 MG PO TABS: 1000.0000 mg | ORAL_TABLET | Freq: Two times a day (BID) | ORAL | Status: DC

## 2012-08-10 MED ORDER — POTASSIUM CHLORIDE CRYS ER 20 MEQ PO TBCR: 20.0000 meq | EXTENDED_RELEASE_TABLET | Freq: Every day | ORAL | Status: DC

## 2012-08-10 MED ORDER — GLIPIZIDE 5 MG PO TABS: 5.0000 mg | ORAL_TABLET | Freq: Two times a day (BID) | ORAL | Status: DC

## 2012-08-10 MED ORDER — TRAZODONE HCL 100 MG PO TABS: 50.0000 mg | ORAL_TABLET | Freq: Every day | ORAL | Status: DC

## 2012-08-10 NOTE — Patient Instructions (Addendum)
Increase the Glipizide to 5 mg TWICE each day. Continue the metformin 1000 mg twice each day. Drink at least 64 ounces of water daily. Reduce sweets, grains, pastas, potatoes (carbs) that will raise your blood sugar more and faster. Increase the LEAFY GREEN veggies that you eat. Your proteins should be lean.
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
## 2012-08-15 ENCOUNTER — Encounter: Payer: Self-pay | Admitting: Physician Assistant

## 2012-08-15 ENCOUNTER — Telehealth: Payer: Self-pay

## 2012-08-15 NOTE — Telephone Encounter (Signed)
Pt states his blood sugar is 141 at 6:30 pm     Best phone [phone]

## 2012-08-16 NOTE — Telephone Encounter (Signed)
Excellent!  Keep up the great work!

## 2012-08-16 NOTE — Telephone Encounter (Signed)
Called him to advise.  

## 2012-08-28 ENCOUNTER — Ambulatory Visit (INDEPENDENT_AMBULATORY_CARE_PROVIDER_SITE_OTHER): Payer: BC Managed Care – PPO | Admitting: Physician Assistant

## 2012-08-28 ENCOUNTER — Encounter: Payer: Self-pay | Admitting: Physician Assistant

## 2012-08-28 VITALS — BP 119/81 | HR 63 | Temp 97.9°F | Resp 16 | Ht 64.5 in | Wt 290.0 lb

## 2012-08-28 DIAGNOSIS — M1990 Unspecified osteoarthritis, unspecified site: Secondary | ICD-10-CM

## 2012-08-28 DIAGNOSIS — R4020 Unspecified coma: Secondary | ICD-10-CM

## 2012-08-28 DIAGNOSIS — I10 Essential (primary) hypertension: Secondary | ICD-10-CM

## 2012-08-28 DIAGNOSIS — E785 Hyperlipidemia, unspecified: Secondary | ICD-10-CM

## 2012-08-28 DIAGNOSIS — M129 Arthropathy, unspecified: Secondary | ICD-10-CM

## 2012-08-28 MED ORDER — PRAVASTATIN SODIUM 40 MG PO TABS: 40.0000 mg | ORAL_TABLET | Freq: Every day | ORAL | Status: DC

## 2012-08-28 MED ORDER — MELOXICAM 15 MG PO TABS: 15.0000 mg | ORAL_TABLET | Freq: Every day | ORAL | Status: DC

## 2012-08-28 NOTE — Patient Instructions (Signed)
Keep up the good work! Exercise 150 minutes each week. Keep working on avoiding sugars, and reducing starch in your diet.

## 2012-08-28 NOTE — Progress Notes (Signed)
  Subjective:    Patient ID: Rodney Walsh, male    DOB: 02/25/1951, 62 y.o.   MRN: [id number]  HPI This 62 y.o. male presents for evaluation of uncontrolled diabetes. He's been working harder than ever before at making healthier eating choices and getting more regular exercise.  Since his last visit several weeks ago, he's really become more determined and engaged than I've ever known him.  I think he is finally taking the risks of uncontrolled DM, and his obesity seriously. He's pleased with himself.  Checking home glucose QD-BID.  Readings range from 141-285, fewer readings >200 in the last week.  Glucose 234 this morning.   Review of Systems Denies chest pain, shortness of breath, HA, dizziness, vision change, nausea, vomiting, diarrhea, constipation, melena, hematochezia, dysuria, increased urinary urgency or frequency, increased hunger or thirst, unintentional weight change, unexplained myalgias or arthralgias, rash.     Objective:   Physical Exam Blood pressure 119/81, pulse 63, temperature 97.9 F (36.6 C), resp. rate 16, height 5' 4.5" (1.638 m), weight 290 lb (131.543 kg). Body mass index is 49.03 kg/(m^2). Weight is stable from 3 weeks ago. Well-developed, well nourished obese BM who is awake, alert and oriented, in NAD. HEENT: [place]/AT, sclera and conjunctiva are clear.   Neck: supple, non-tender, no lymphadenopathy, thyromegaly. Heart: RRR, no murmur Lungs: normal effort, CTA Extremities: no cyanosis, clubbing or edema. Skin: warm and dry without rash. Psychologic: good mood and appropriate affect, normal speech and behavior.     Assessment & Plan:  Type II or unspecified type diabetes mellitus without mention of complication, uncontrolled, but improving.  Continue current plan.  Congratulated on efforts thus far and encouraged continued work.  Morbid obesity - as above.  Arthritis - Plan: REFILL meloxicam (MOBIC) 15 MG tablet  HTN (hypertension) - controlled. Continue  current treatment.  Other and unspecified hyperlipidemia - Plan: REFILL pravastatin (PRAVACHOL) 40 MG tablet  Fernande Bras, PA-C Physician Assistant-Certified [facility]

## 2012-09-11 ENCOUNTER — Encounter: Payer: BC Managed Care – PPO | Attending: Physician Assistant | Admitting: *Deleted

## 2012-09-11 ENCOUNTER — Encounter: Payer: Self-pay | Admitting: *Deleted

## 2012-09-11 DIAGNOSIS — E119 Type 2 diabetes mellitus without complications: Secondary | ICD-10-CM | POA: Insufficient documentation

## 2012-09-11 DIAGNOSIS — Z713 Dietary counseling and surveillance: Secondary | ICD-10-CM | POA: Insufficient documentation

## 2012-09-11 NOTE — Progress Notes (Signed)
Diabetes Self-Management Education  Visit Number: First/Initial  09/11/2012 Mr. Rodney Walsh, identified by name and date of birth, is a 62 y.o. male with Diabetes Type: Type 2.  Other people present during visit:      ASSESSMENT  Patient Concerns:  Nutrition/Meal planning;Medication;Monitoring;Healthy Lifestyle;Problem Solving;Glycemic Control;Weight Control  There were no vitals taken for this visit. There is no weight on file to calculate BMI.  Lab Results: LDL Cholesterol  Date Value Range Status  08/10/2012 70  0 - 99 mg/dL Final           Total Cholesterol/HDL Ratio:CHD Risk                            Coronary Heart Disease Risk Table                                            Men       Women              1/2 Average Risk              3.4        3.3                  Average Risk              5.0        4.4               2X Average Risk              9.6        7.1               3X Average Risk             23.4       11.0     Use the calculated Patient Ratio above and the CHD Risk table      to determine the patient's CHD Risk.     ATP III Classification (LDL):           < 100        mg/dL         Optimal          100 - 129     mg/dL         Near or Above Optimal          130 - 159     mg/dL         Borderline High          160 - 189     mg/dL         High           > 190        mg/dL         Very High           Hemoglobin A1C  Date Value Range Status  08/10/2012 11.2   Final  03/30/2008 6.7 (NOTE)   The ADA recommends the following therapeutic goal for glycemic   control related to Hgb A1C measurement:   Goal of Therapy:   < 7.0% Hgb A1C   Reference: American Diabetes Association: Clinical Practice   Recommendations 2008, Diabetes Care,  2008, 31:(Suppl 1).*  Final     Family History  Problem  Relation Age of Onset  . Cancer Mother     ovarian  . Alzheimer's disease Father   . Hypertension Sister   . Diabetes Brother   . Renal Disease Brother   . Heart disease  Brother   . Diabetes Daughter   . Seizures Daughter   . Cancer Sister     ?uterine  . Hypertension Sister   . Diabetes Sister   . Hyperlipidemia Sister   . Hypertension Sister   . Colon cancer Neg Hx    History  Substance Use Topics  . Smoking status: Former Smoker -- 1.00 packs/day    Types: Cigarettes    Quit date: 08/10/2011  . Smokeless tobacco: Never Used  . Alcohol Use: No    Support Systems:  Friends;Parents Special Needs:  None  Prior DM Education:   Daily Foot Exams: Yes Patient Belief / Attitude about Diabetes:  Diabetes can be controlled  Assessment comments: Rodney Walsh received some diabetes education in the past, but nothing about food.  His glucose was running in the 400s and provider stopped him from working and his glucose has dropped to 140-170 fasting    Diet Recall:  B: maybe leftovers form dinner or eggs, grits, toast, coffee L: vienna sausage with crackers or sandwich.  Might skip S: Nabs or more vienna sausage D: meat, starch.  Sometimes vegetable Beverages: water or tea    Individualized Plan for Diabetes Self-Management Training:   Education Topics Reviewed with Patient Today:  Topic Points Discussed  Disease State Definition of diabetes, type 1 and 2, and the diagnosis of diabetes;Factors that contribute to the development of diabetes  Nutrition Management Role of diet in the treatment of diabetes and the relationship between the three main macronutrients and blood glucose level;Food label reading, portion sizes and measuring food.;Carbohydrate counting;Reviewed blood glucose goals for pre and post meals and how to evaluate the patients' food intake on their blood glucose level.;Information on hints to eating out and maintain blood glucose control.  Physical Activity and Exercise Role of exercise on diabetes management, blood pressure control and cardiac health.  Medications Reviewed patients medication for diabetes, action, purpose, timing of dose  and side effects.  Monitoring Purpose and frequency of SMBG.;Taught/discussed recording of test results and interpretation of SMBG.;Interpreting lab values - A1C, lipid, urine microalbumina.;Identified appropriate SMBG and A1C goals.  Acute Complications Discussed and identified patients' treatment of hyperglycemia.  Chronic Complications Relationship between chronic complications and blood glucose control  Psychosocial Adjustment Role of stress on diabetes  Goal Setting    Preconception Care (if applicable)       Education material provided:   Living Well with Diabetes  Carb Counting and Food Label handouts  Meal Plan Card    If problems or questions, patient to contact team via:  Phone  Time in: 1130     Time out: 1230  Future DSME appointment: -  4-6 weeks   Carrolyn Leigh 09/11/2012 12:45 PM

## 2012-09-11 NOTE — Patient Instructions (Signed)
Goals:  Follow Diabetes Meal Plan as instructed  Eat 3 meals and 2 snacks, every 3-5 hrs  Limit carbohydrate intake to 45-60 grams carbohydrate/meal  Limit carbohydrate intake to 15-30 grams carbohydrate/snack  Add lean protein foods to meals/snacks  Monitor glucose levels as instructed by your doctor  Aim for 30 mins of physical activity daily  Bring food record and glucose log to your next nutrition visit 

## 2012-09-25 ENCOUNTER — Encounter: Payer: Self-pay | Admitting: Physician Assistant

## 2012-09-25 ENCOUNTER — Ambulatory Visit (INDEPENDENT_AMBULATORY_CARE_PROVIDER_SITE_OTHER): Payer: BC Managed Care – PPO | Admitting: Physician Assistant

## 2012-09-25 VITALS — BP 121/76 | HR 70 | Temp 98.0°F | Resp 16 | Ht 64.0 in | Wt 288.8 lb

## 2012-09-25 DIAGNOSIS — H269 Unspecified cataract: Secondary | ICD-10-CM | POA: Insufficient documentation

## 2012-09-25 DIAGNOSIS — E785 Hyperlipidemia, unspecified: Secondary | ICD-10-CM

## 2012-09-25 DIAGNOSIS — M549 Dorsalgia, unspecified: Secondary | ICD-10-CM

## 2012-09-25 DIAGNOSIS — I10 Essential (primary) hypertension: Secondary | ICD-10-CM

## 2012-09-25 MED ORDER — CYCLOBENZAPRINE HCL 10 MG PO TABS: 10.0000 mg | ORAL_TABLET | Freq: Every evening | ORAL | Status: DC | PRN

## 2012-09-25 NOTE — Progress Notes (Signed)
  Subjective:    Patient ID: IMAAD REUSS, male    DOB: [birth year], 62 y.o.   MRN: [id number]  HPI This 62 y.o. male presents for evaluation of uncontrolled DM type 2.  He's so please with his recent efforts and weight loss!.  Frequency of home glucose monitoring: QD-BID, 120-198; 167 this morning at 9am. Sees an eye specialist annually (earlier this month, new diagnosis of early cataracts).  No routine dental exams. Checks feet daily. Is current with influenza vaccine. Is current with pneumococcal vaccine. Saw the nutritionist, and he's to be counting carbs, and follow-up next month. He notes he often forgets to take the pravastatin and evening dose of metformin.  Past medical history, surgical history, family history, social history and problem list reviewed.  Patient Active Problem List   Diagnosis Date Noted  . Cataract 09/25/2012  . OSA (obstructive sleep apnea) 08/10/2012  . Eczema 08/10/2012  . Vitamin D deficiency 08/10/2012  . Erectile dysfunction 08/10/2012  . Special screening for malignant neoplasms, colon 07/17/2012  . Personal history of adenomatous colonic polyps 04/08/2012  . Prostate cancer 01/04/2012  . Type II or unspecified type diabetes mellitus without mention of complication, uncontrolled 10/25/2011  . HTN (hypertension) 10/25/2011  . Hyperlipidemia LDL goal <70 10/25/2011  . Morbid obesity 10/25/2011    Review of Systems RIGHT low back pain x 2 weeks.  Occurs with long-time standing. Also occurs when he's hungry. Resolves with rest. Takes meloxicam every day.  Denies chest pain, shortness of breath, HA, dizziness, vision change, nausea, vomiting, diarrhea, constipation, melena, hematochezia, dysuria, increased urinary urgency or frequency, increased hunger or thirst, unintentional weight change, rash.     Objective:   Physical Exam Blood pressure 121/76, pulse 70, temperature 98 F (36.7 C), temperature source Oral, resp. rate 16, height 5\' 4"  (1.626  m), weight 288 lb 12.8 oz (130.999 kg), SpO2 96.00%. Body mass index is 49.55 kg/(m^2). Well-developed, well nourished BM who is awake, alert and oriented, in NAD. HEENT: [place]/AT, sclera and conjunctiva are clear.   Neck: supple, non-tender, no lymphadenopathy, thyromegaly. Heart: RRR, no murmur Lungs: normal effort, CTA Psychologic: good mood and appropriate affect, normal speech and behavior.   Results for orders placed in visit on 09/25/12  GLUCOSE, POCT (MANUAL RESULT ENTRY)      Result Value Range   POC Glucose 151 (*) 70 - 99 mg/dl       Assessment & Plan:  Type II or unspecified type diabetes mellitus without mention of complication, uncontrolled - Plan: POCT glucose (manual entry)  HTN (hypertension) - controlled.  Continue current treatment.  Hyperlipidemia LDL goal <70 - continue healthy eating and pravastatin.  Morbid obesity - continue increased physical activity and healthier eating.  Cataract - follow-up with ophthalmology per their recommendations.  Back pain - Plan: cyclobenzaprine (FLEXERIL) 10 MG tablet  Fernande Bras, PA-C Physician Assistant-Certified [facility]

## 2012-09-25 NOTE — Patient Instructions (Signed)
Keep up the great work! Take your medications like you're supposed to-Don't skip doses.

## 2012-10-18 ENCOUNTER — Telehealth: Payer: Self-pay

## 2012-10-18 NOTE — Telephone Encounter (Signed)
Patient needs paperwork (disability) showing his provider put him out of work for six months.  He states the paperwork turned in to [facility] is only through June 16th.   Please verify his disability and call him at [phone]

## 2012-10-22 NOTE — Telephone Encounter (Signed)
Patient called about leave of absence for six months.  He stated that his paperwork says through June 16th  On August 10, 2012 you put him out for six months according to chart.  Can you address this.

## 2012-10-22 NOTE — Telephone Encounter (Signed)
I do not see a scanned copy of the disability paperwork in the electronic record.  If I can see a paper copy, I'm happy to addend it.  It was for 6 months beginning 08/10/2012.

## 2012-10-23 ENCOUNTER — Encounter: Payer: BC Managed Care – PPO | Attending: Physician Assistant | Admitting: *Deleted

## 2012-10-23 VITALS — Wt 293.5 lb

## 2012-10-23 DIAGNOSIS — E119 Type 2 diabetes mellitus without complications: Secondary | ICD-10-CM | POA: Insufficient documentation

## 2012-10-23 DIAGNOSIS — Z713 Dietary counseling and surveillance: Secondary | ICD-10-CM | POA: Insufficient documentation

## 2012-10-23 NOTE — Telephone Encounter (Signed)
Found paper copy in chart, entire chart is in your box.

## 2012-10-23 NOTE — Progress Notes (Signed)
  Medical Nutrition Therapy:  Appt start time: 0900 end time:  0930.   Assessment:  Primary concerns today: Rodney Walsh is here for a follow up appointment pertaining to his diabetes.  He brought his glucose log with him today and most of his fasting glucose values are between 120-150.  His post-prandial glucose values are between 130-170.  He is not taking his medications as prescribed.  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  He limits his carbohydrates to 15 g/meal but drinks excessive cranberry juice.  MEDICATIONS: not as prescribed   DIETARY INTAKE:  Usual eating pattern includes 2-3 meals and 2 snacks per day.  Everyday foods include proteins, vegetables, starches.  Avoided foods include desserts and soda.    24-hr recall:  B ( AM): ham and egg biscuit with water; grits and eggs and sausage.  Coffee with splenda.  Skips sometimes  Snk ( AM): none  L ( PM): watermelon, tuna salad; chinese food; vienna sausages, sardines; ham sandwich Snk ( PM): peanut butter crackers (Nabs); few chips D ( PM): tossed salad with fish Snk ( PM): sugar-free popsicles; fruit smoothie; oatmeal cookies with peanut butter Beverages: coffee, water, cranberry juice  Usual physical activity: walks.  Working on Technical sales engineer needs: 1800 calories 200 g carbohydrates 135 g protein 50 g fat  Progress Towards Goal(s):  Some progress.   Nutritional Diagnosis:  NB-1.1 Food and nutrition-related knowledge deficit As related to proper balance of fats, carbohydrates, and proteins.  As evidenced by dietary recall.    Intervention:  Nutrition counseling provided.  Reviewed [name]'s meal plan card from last visit.  Reminded him to chose 3-4 carb choices/meal and 1-2/snack.  Discussed carbohydrates in juice and strongly recommended limiting juice consumption.  Discussed daily physical activity.  Emphasized need to take medications as prescribed.  Gave recommendations for glucose  monitoring: fasting- 90-120 mg/dl and pp- less than 161.    Handouts given during visit include:  My meal plan card   Monitoring/Evaluation:  Dietary intake, exercise, BGM, and body weight in 6 month(s).

## 2012-10-23 NOTE — Patient Instructions (Addendum)
Try Diet Ocean Spr[place] juice Everything in moderation :-) once in awhile won't kill you  Goals:  Follow Diabetes Meal Plan as instructed  Eat 3 meals and 2 snacks, every 3-5 hrs  Limit carbohydrate intake to 45-60 grams carbohydrate/meal  Limit carbohydrate intake to 15-30 grams carbohydrate/snack  Add lean protein foods to meals/snacks  Monitor glucose levels as instructed by your doctor  Aim for 30 mins of physical activity daily  Bring food record and glucose log to your next nutrition visit  TAKE MEDICATION EVERY DAY!!!!!!!!!!!!!!!!!!!!!!!!!!!!!!!!!!!!!!!!!!!!!!!!!!!!!!!!!!!!!!  Fasting glucose target is 90-120 2 hours  After meal target is less than 160  Bedtime is 140

## 2012-10-24 NOTE — Telephone Encounter (Signed)
Spoke to patient, [facility] claimed forms were unclear as to how long he would be out. Chelle updated paperwork and patient will pick up tomorrow morning.

## 2012-10-24 NOTE — Telephone Encounter (Signed)
I've reviewed the forms in the chart.  I do not see a date of 10/22/2012 to addend.  Please get with me to take care of this for this patient.

## 2012-11-06 ENCOUNTER — Encounter: Payer: Self-pay | Admitting: Physician Assistant

## 2012-11-06 ENCOUNTER — Ambulatory Visit (INDEPENDENT_AMBULATORY_CARE_PROVIDER_SITE_OTHER): Payer: BC Managed Care – PPO | Admitting: Physician Assistant

## 2012-11-06 VITALS — BP 154/96 | HR 67 | Temp 98.2°F | Resp 18 | Ht 63.25 in | Wt 289.2 lb

## 2012-11-06 DIAGNOSIS — R32 Unspecified urinary incontinence: Secondary | ICD-10-CM

## 2012-11-06 DIAGNOSIS — R1011 Right upper quadrant pain: Secondary | ICD-10-CM

## 2012-11-06 DIAGNOSIS — E785 Hyperlipidemia, unspecified: Secondary | ICD-10-CM

## 2012-11-06 DIAGNOSIS — I10 Essential (primary) hypertension: Secondary | ICD-10-CM

## 2012-11-06 DIAGNOSIS — E119 Type 2 diabetes mellitus without complications: Secondary | ICD-10-CM

## 2012-11-06 DIAGNOSIS — N529 Male erectile dysfunction, unspecified: Secondary | ICD-10-CM

## 2012-11-06 MED ORDER — FUROSEMIDE 40 MG PO TABS: 40.0000 mg | ORAL_TABLET | Freq: Every day | ORAL | Status: DC | PRN

## 2012-11-06 MED ORDER — OXYBUTYNIN CHLORIDE 5 MG PO TABS: ORAL_TABLET | ORAL | Status: DC

## 2012-11-06 MED ORDER — PRAVASTATIN SODIUM 40 MG PO TABS: 40.0000 mg | ORAL_TABLET | Freq: Every day | ORAL | Status: DC

## 2012-11-06 MED ORDER — LISINOPRIL 10 MG PO TABS: 10.0000 mg | ORAL_TABLET | Freq: Every day | ORAL | Status: DC

## 2012-11-06 MED ORDER — POTASSIUM CHLORIDE CRYS ER 20 MEQ PO TBCR: 20.0000 meq | EXTENDED_RELEASE_TABLET | Freq: Every day | ORAL | Status: DC | PRN

## 2012-11-06 MED ORDER — METFORMIN HCL 1000 MG PO TABS: 1000.0000 mg | ORAL_TABLET | Freq: Two times a day (BID) | ORAL | Status: DC

## 2012-11-06 NOTE — Patient Instructions (Addendum)
Continue taking Oxybutynin and Aspirin. Re-start Pravastatin.   If tolerating well and no reoccurrence of symptoms after 1 week, re-start Lisinopril.   If tolerating well and no reoccurrence of symptomsafter 1 week, re-start Metformin.   Change Furosemide and Potassium to as needed basis per Cardiologist's instructions.   Discontinue Glipizide and Trazodone.      Continue working hard on making healthy eating choices and regular exercise. Continue checking your blood sugar 1-2 times daily, and working on healthy eating and regular exercise.

## 2012-11-07 NOTE — Progress Notes (Signed)
I have examined this patient along with the student and agree.  

## 2012-11-07 NOTE — Progress Notes (Signed)
Subjective:    Patient ID: Rodney Walsh, male    DOB: 20-Apr-1951, 62 y.o.   MRN: [id number]  HPI Rodney Walsh is a 62 y.o. African American male presenting for his 3 month follow-up on DM Type II, Hypertension, and Hyperlipidemia.  He describes working diligently with his nutritionist on his diet and exercise plans.  He admits to not being where he wants to be but is striving to get there.  He is making fruit/veggie smoothies and trying to walk daily.  He currently checks his blood sugars twice a day and records them in a journal.  The patient denies numbness, tingling, muscle aches, headache, chest pain, shortness of breath, cough, blurry vision, and edema.  The patient self-discontinued all of his medications except aspirin and oxybutynin on 6/12.  He has been dealing with erectile dysfunction and believes it is related to his medications causing low blood sugar and low blood pressure.  Within 3 days of discontinuing the rest of his medications, his ED was no longer a problem.    At his last visit in March, he was seen for back pain and began taking Flexeril and Mobic.  The pain has now moved into his RUQ.  It is associated with exertion and relieved by rest.  The patient believes it could be a gas bubble.     Prior to Admission medications   Medication Sig Start Date End Date Taking? Authorizing Provider  aspirin 81 MG tablet Take 81 mg by mouth daily.   Yes Historical Provider, MD  oxybutynin (DITROPAN) 5 MG tablet TAKE ONE TABLET BY MOUTH TWICE DAILY 11/06/12  Yes Chelle S Jeffery, PA-C  furosemide (LASIX) 40 MG tablet Take 1 tablet (40 mg total) by mouth daily as needed for edema. 11/06/12   Chelle S Jeffery, PA-C  lisinopril (PRINIVIL,ZESTRIL) 10 MG tablet Take 1 tablet (10 mg total) by mouth daily. 11/06/12   Chelle Tessa Lerner, PA-C  metFORMIN (GLUCOPHAGE) 1000 MG tablet Take 1 tablet (1,000 mg total) by mouth 2 (two) times daily with a meal. 11/06/12   Chelle S Jeffery, PA-C  potassium  chloride SA (KLOR-CON M20) 20 MEQ tablet Take 1 tablet (20 mEq total) by mouth daily as needed (if take furosemide (Lasix)). 11/06/12   Chelle S Jeffery, PA-C  pravastatin (PRAVACHOL) 40 MG tablet Take 1 tablet (40 mg total) by mouth daily. 11/06/12   Chelle Tessa Lerner, PA-C    No Known Allergies  Past Medical History  Diagnosis Date  . Hyperlipidemia   . Hypertension   . Diabetes mellitus without complication   . Prostate cancer   . Arthritis     left shoulder and left knee  . Nephrolithiasis     Past Surgical History  Procedure Laterality Date  . Prostate surgery      with seed placement  . Back surgery    . Colonoscopy N/A 07/17/2012    Procedure: COLONOSCOPY;  Surgeon: Iva Boop, MD;  Location: [facility];  Service: Endoscopy;  Laterality: N/A;    History   Social History  . Marital Status: Married    Spouse Name: Rodney Walsh    Number of Children: 2  . Years of Education: 12   Occupational History  . BUS DRIVER [employer]   Social History Main Topics  . Smoking status: Former Smoker -- 1.00 packs/day    Types: Cigarettes    Quit date: 08/10/2011  . Smokeless tobacco: Never Used  . Alcohol Use: No  .  Drug Use: No  . Sexually Active: Yes -- Male partner(s)   Other Topics Concern  . Not on file   Social History Narrative   Lives with his wife of 36 years, and their two younger children, Rodney Walsh and Rodney Walsh representative.    Family History  Problem Relation Age of Onset  . Cancer Mother     ovarian  . Alzheimer's disease Father   . Hypertension Sister   . Diabetes Brother   . Renal Disease Brother   . Heart disease Brother   . Diabetes Daughter   . Seizures Daughter   . Cancer Sister     ?uterine  . Hypertension Sister   . Diabetes Sister   . Hyperlipidemia Sister   . Hypertension Sister   . Colon cancer Neg Hx     Review of Systems As stated above.    Objective:   Physical Exam  Filed Vitals:   11/06/12 1011  BP: 154/96  Pulse: 67  Temp: 98.2  F (36.8 C)  TempSrc: Oral  Resp: 18  Height: 5' 3.25" (1.607 m)  Weight: 289 lb 3.2 oz (131.18 kg)  SpO2: 97%   General:  Obese male in no acute distress. Cardiovascular:  S1 and S2 are distinct with no murmurs, rubs or gallops. Respiratory:  CTA.  No adventitious sounds. PVS:  No carotid bruits present.  2+ pedal pulses. Skin:  No visible lesions of feet. Neuro:  Full sensation to monofilament.  See Diabetic Foot Exam for more information.    Results for orders placed in visit on 09/25/12  GLUCOSE, POCT (MANUAL RESULT ENTRY)      Result Value Range   POC Glucose 151 (*) 70 - 99 mg/dl      Assessment & Plan:  1. Erectile dysfunction Working with patient to identify which medication could be the cause of his ED - see patient instructions for plan to re-start each med gradually.  Educated the patient on the importance of maintaining controlled blood pressure and blood sugar.  Strongly advised the patient to call prior to self-discontinuing medication in the future.  2. Type II or unspecified type diabetes mellitus without mention of complication, not stated as uncontrolled Continue working with nutritionist and being diligent with taking/recording blood sugars twice daily.  Order POC Glucose.  Re-start metFORMIN (GLUCOPHAGE) 1000 MG tablet; Take 1 tablet (1,000 mg total) by mouth 2 (two) times daily with a meal.  Dispense: 180 tablet; Refill: 1.   3. HTN (hypertension) Continue working with nutritionist and working hard on healthy eating and regular exercise.   Re-order: - furosemide (LASIX) 40 MG tablet; Take 1 tablet (40 mg total) by mouth daily as needed for edema.  Dispense: 90 tablet; Refill: 1 - lisinopril (PRINIVIL,ZESTRIL) 10 MG tablet; Take 1 tablet (10 mg total) by mouth daily.  Dispense: 90 tablet; Refill: 1 - potassium chloride SA (KLOR-CON M20) 20 MEQ tablet; Take 1 tablet (20 mEq total) by mouth daily as needed (if take furosemide (Lasix)).  Dispense: 90 tablet; Refill:  1  4. Hyperlipidemia LDL goal <70 Continue working with nutritionist and working hard on healthy eating and regular exercise.   Re-order: - metFORMIN (GLUCOPHAGE) 1000 MG tablet; Take 1 tablet (1,000 mg total) by mouth 2 (two) times daily with a meal.  Dispense: 180 tablet; Refill: 1 - pravastatin (PRAVACHOL) 40 MG tablet; Take 1 tablet (40 mg total) by mouth daily.  Dispense: 90 tablet; Refill: 1  5. Morbid obesity Continue working with nutritionist and working hard  on healthy eating and regular exercise.    6. RUQ abdominal pain Order US Abdomen Limited RUQ; Future.  7. Urinary incontinence Stable with medication.  Re-order oxybutynin (DITROPAN) 5 MG tablet; TAKE ONE TABLET BY MOUTH TWICE DAILY  Dispense: 180 tablet; Refill: 1.

## 2012-11-23 ENCOUNTER — Ambulatory Visit
Admission: RE | Admit: 2012-11-23 | Discharge: 2012-11-23 | Disposition: A | Discharge status: Home / Self Care | Payer: BC Managed Care – PPO | Source: Ambulatory Visit | Attending: Physician Assistant | Admitting: Physician Assistant

## 2012-11-23 DIAGNOSIS — R1011 Right upper quadrant pain: Secondary | ICD-10-CM

## 2012-11-29 ENCOUNTER — Telehealth: Payer: Self-pay

## 2012-11-29 NOTE — Telephone Encounter (Signed)
[place] results given to patient by Rebekah.

## 2012-11-29 NOTE — Telephone Encounter (Signed)
Patient returned call for MRI results.  [phone]

## 2012-12-10 ENCOUNTER — Encounter: Payer: Self-pay | Admitting: *Deleted

## 2012-12-10 ENCOUNTER — Encounter: Payer: BC Managed Care – PPO | Attending: Physician Assistant | Admitting: *Deleted

## 2012-12-10 VITALS — Wt 294.4 lb

## 2012-12-10 DIAGNOSIS — E785 Hyperlipidemia, unspecified: Secondary | ICD-10-CM

## 2012-12-10 DIAGNOSIS — Z713 Dietary counseling and surveillance: Secondary | ICD-10-CM | POA: Insufficient documentation

## 2012-12-10 DIAGNOSIS — E119 Type 2 diabetes mellitus without complications: Secondary | ICD-10-CM | POA: Insufficient documentation

## 2012-12-10 DIAGNOSIS — I10 Essential (primary) hypertension: Secondary | ICD-10-CM

## 2012-12-10 NOTE — Progress Notes (Signed)
  Medical Nutrition Therapy:  Appt start time: 0930 end time:  1000.   Assessment:  Primary concerns today: Rodney Walsh is here for a follow up appointment pertaining to Type 2 diabetes.  His fasting glucose is high, but his pm glucose ranges are in target range.  He refuses to take his sleeping pills.  He has added bike riding and has made some dietary changes.  He was on vacation last week and gained some weight.  His doctor made some medication changes and right now he's not taking anything for his diabetes  MEDICATIONS: see list.  No longer tacking metformin   DIETARY INTAKE:  Usual eating pattern includes 2-3 meals and 1-3 snacks per day.  Everyday foods include proteins, staches.  Avoided foods include none.    24-hr recall:  B ( AM): 2 scrambled eggs, 1 sausage, 1 cup grits and 1 1/2 slices whole wheat bread with black coffee  Snk ( AM): watermelon  L ( PM): Nabs.  Fried fish sandwich Snk ( PM): maybe peanut butter  D ( PM): [place] burgers, salads Snk ( PM): potato chips, ice cream, honey roasted peanuts Beverages: tea with Splenda, water.  Sometimes juice, but chases it with water  Usual physical activity: started riding bike maybe 2 day/week  Estimated energy needs: 1800 calories 200 g carbohydrates 135 g protein 50 g fat  Progress Towards Goal(s):  In progress.   Nutritional Diagnosis:  [place]-2.1 Inpaired nutrition utilization As related to glucose.  As evidenced by diabetes and obesity.    Intervention:  Nutrition counseling provided.  Discussed [place]'s glucose values.  They are consistently high in the morning most likely due to late night snacking.  Told him to put all food in the kitchen, not in his bedroom.  When he wants a snack, get up and go eat it in the kitchen.  Then come back to his room.  Do not eat while watching tv.  He states that's too much effort, which means he will cut back on his snacking.  Encouraged riding his bike 3 days/week instead of 2.   Support  system: Wife   Monitoring/Evaluation:  Dietary intake, exercise, BGM, and body weight in 3 month(s).

## 2012-12-10 NOTE — Patient Instructions (Addendum)
Glucose targets:  In the morning: 80-130 mg/dl 2 hours after meal: less than 180 mg/dl Right before bed: less than 140 mg/dl  Put all food in the kitchen. If you want something, get up and go eat it in the kitchen.  Do not eat while watching tv!!!!!  Try bike riding 3 days instead of 2

## 2012-12-17 ENCOUNTER — Encounter: Payer: Self-pay | Admitting: Physician Assistant

## 2012-12-18 ENCOUNTER — Encounter: Payer: Self-pay | Admitting: Physician Assistant

## 2012-12-18 ENCOUNTER — Ambulatory Visit (INDEPENDENT_AMBULATORY_CARE_PROVIDER_SITE_OTHER): Payer: BC Managed Care – PPO | Admitting: Physician Assistant

## 2012-12-18 VITALS — BP 140/90 | HR 68 | Temp 98.9°F | Resp 16 | Ht 63.0 in | Wt 290.0 lb

## 2012-12-18 DIAGNOSIS — E119 Type 2 diabetes mellitus without complications: Secondary | ICD-10-CM

## 2012-12-18 DIAGNOSIS — E785 Hyperlipidemia, unspecified: Secondary | ICD-10-CM

## 2012-12-18 DIAGNOSIS — I10 Essential (primary) hypertension: Secondary | ICD-10-CM

## 2012-12-18 LAB — POCT GLYCOSYLATED HEMOGLOBIN (HGB A1C): Hemoglobin A1C: 6.6

## 2012-12-18 LAB — LIPID PANEL
Cholesterol: 146 mg/dL (ref 0–200)
HDL: 40 mg/dL (ref >39)
LDL Cholesterol: 85 mg/dL (ref 0–99)
Triglycerides: 107 mg/dL (ref <150)

## 2012-12-18 LAB — COMPREHENSIVE METABOLIC PANEL
ALT: 23 U/L (ref 0–53)
CO2: 30 mEq/L (ref 19–32)
Calcium: 9.1 mg/dL (ref 8.4–10.5)
Chloride: 103 mEq/L (ref 96–112)
Sodium: 139 mEq/L (ref 135–145)
Total Bilirubin: 0.5 mg/dL (ref 0.3–1.2)
Total Protein: 6.6 g/dL (ref 6.0–8.3)

## 2012-12-18 NOTE — Progress Notes (Signed)
I have examined this patient along with the student and agree.  Congratulated on his significant improvement in his A1C!  Encouraged continued efforts for weight loss. Await lipid results to determine if he should restart statin therapy.

## 2012-12-18 NOTE — Patient Instructions (Signed)
KEEP UP THE GREAT WORK!!!!!!!

## 2012-12-18 NOTE — Progress Notes (Signed)
Subjective:    Patient ID: Rodney Walsh, male    DOB: 1950/11/11, 62 y.o.   MRN: [id number]  HPI 62 y.o. African American male presents to clinic today for his 3 month follow-up on DM Type II, Hypertension, and Hyperlipidemia. He self-discontinued all of his medications except aspirin and oxybutynin on 6/12. He has since started taking Aleve daily to help with his "aches and pains from getting old." He currently checks his blood sugars twice a day and records them in a journal. His home glucose checks range from 103-198 but most are in the 150's.   He is still working with his nutritionist on his diet and exercise plans. He has not seen as much weight loss as he was initially but he says that as long as he is seeing some and feeling some changes that that is ok with him. He is working on riding his bike 3 days a week. He says that he does not ride far but enough to get his heart rate up. He is watching his diet as best he can. He says he eats his biggest meal at breakfast- which is usually 2 eggs, grits, whole wheat piece of toast and sausage. Then he eats "whatever" for lunch generally chicken and a salad for dinner.  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 He also feels like his hands are "tight or swollen." He says his Aleve helps with this.   He is no longer having RUQ abdominal pain and since stopping his medications he is not having trouble with ED. The patient denies numbness, tingling, muscle aches, headache, changes in bowel movements or difficulty urinating, chest pain, shortness of breath, cough, changes in vision, rash or edema.    Review of Systems  All other systems reviewed and are negative.       Objective:   Physical Exam  Nursing note and vitals reviewed. Constitutional: He is oriented to person, place, and time. Vital signs are normal. He appears well-developed. No distress.  Obese African  American male BP 140/90  Pulse 68  Temp(Src) 98.9 F (37.2 C) (Oral)  Resp 16  Ht 5\' 3"  (1.6 m)  Wt 290 lb (131.543 kg)  BMI 51.38 kg/m2  SpO2 97%  HENT:  Head: Normocephalic and atraumatic.  Right Ear: External ear normal.  Left Ear: External ear normal.  Nose: Nose normal.  Eyes: Conjunctivae and lids are normal.  Neck: Trachea normal and normal range of motion. Neck supple. No thyromegaly present.  Cardiovascular: Normal rate, regular rhythm, normal heart sounds and intact distal pulses.   Pulmonary/Chest: Effort normal and breath sounds normal.  Abdominal: Normal appearance and bowel sounds are normal. There is no tenderness.  Musculoskeletal: Normal range of motion.  Lymphadenopathy:    He has no cervical adenopathy.  Neurological: He is alert and oriented to person, place, and time.  Skin: Skin is warm and dry. Rash noted. He is not diaphoretic.  eczematous rash on on hands and forearms. Excoriated   Psychiatric: He has a normal mood and affect. His behavior is normal. Judgment and thought content normal.   Results for orders placed in visit on 12/18/12  GLUCOSE, POCT (MANUAL RESULT ENTRY)      Result Value Range   POC Glucose 128 (*) 70 - 99 mg/dl  POCT GLYCOSYLATED HEMOGLOBIN (HGB A1C)      Result Value Range   Hemoglobin A1C  6.6         Assessment & Plan:  Type II or unspecified type diabetes mellitus without mention of complication, not stated as uncontrolled - Plan: POCT glucose (manual entry), POCT glycosylated hemoglobin (Hb A1C), Comprehensive metabolic panel  HTN (hypertension)  Hyperlipidemia LDL goal <70 - Plan: Lipid panel  Morbid obesity- continue to work with Nutritionist on dietary changes and continue to exercise and work on increasing biking.  Will contact patient as soon as his lab results are available. He should return for follow up in 3 months or sooner if needed.

## 2012-12-19 ENCOUNTER — Encounter: Payer: Self-pay | Admitting: Physician Assistant

## 2012-12-30 ENCOUNTER — Ambulatory Visit (INDEPENDENT_AMBULATORY_CARE_PROVIDER_SITE_OTHER): Payer: BC Managed Care – PPO | Admitting: Emergency Medicine

## 2012-12-30 VITALS — BP 164/90 | HR 59 | Temp 97.8°F | Resp 16

## 2012-12-30 DIAGNOSIS — M545 Low back pain: Secondary | ICD-10-CM

## 2012-12-30 DIAGNOSIS — S335XXA Sprain of ligaments of lumbar spine, initial encounter: Secondary | ICD-10-CM

## 2012-12-30 LAB — POCT UA - MICROSCOPIC ONLY
Casts, Ur, LPF, POC: NEGATIVE
Crystals, Ur, HPF, POC: NEGATIVE
Epithelial cells, urine per micros: NEGATIVE
Mucus, UA: POSITIVE

## 2012-12-30 LAB — POCT URINALYSIS DIPSTICK
Bilirubin, UA: NEGATIVE
Nitrite, UA: NEGATIVE
Spec Grav, UA: 1.02
pH, UA: 7

## 2012-12-30 MED ORDER — NAPROXEN SODIUM 550 MG PO TABS: 550.0000 mg | ORAL_TABLET | Freq: Two times a day (BID) | ORAL | Status: DC

## 2012-12-30 MED ORDER — CYCLOBENZAPRINE HCL 10 MG PO TABS: 10.0000 mg | ORAL_TABLET | Freq: Three times a day (TID) | ORAL | Status: DC | PRN

## 2012-12-30 MED ORDER — HYDROCODONE-ACETAMINOPHEN 5-325 MG PO TABS: 1.0000 | ORAL_TABLET | ORAL | Status: DC | PRN

## 2012-12-30 NOTE — Patient Instructions (Addendum)

## 2012-12-30 NOTE — Progress Notes (Signed)
[facility] [address] [place] [address] [phone]  Date:  12/30/2012   Name:  Rodney Walsh   DOB:  [birth year]   MRN:  [id number]  PCP:  Bleu Moisan,CHELLE, PA-C    Chief Complaint: Back Pain   History of Present Illness:  Rodney Walsh is a 62 y.o. very pleasant male patient who presents with the following:  Sleeps in a sleep number bed and left the air out of it and slept in it deflated.  When he awakened, he had pain in his left lower back and into his left leg.  Has some numbness to mid thigh.  No weakness. Pain limited to period since his mattress experiment.  No dysuria or urgency.  No history or disc disease or injury.  No improvement with over the counter medications or other home remedies. Denies other complaint or health concern today.   Patient Active Problem List   Diagnosis Date Noted  . Cataract 09/25/2012  . OSA (obstructive sleep apnea) 08/10/2012  . Eczema 08/10/2012  . Vitamin D deficiency 08/10/2012  . Erectile dysfunction 08/10/2012  . Special screening for malignant neoplasms, colon 07/17/2012  . Personal history of adenomatous colonic polyps 04/08/2012  . Prostate cancer 01/04/2012  . Type II or unspecified type diabetes mellitus without mention of complication, uncontrolled 10/25/2011  . HTN (hypertension) 10/25/2011  . Hyperlipidemia LDL goal <70 10/25/2011  . Morbid obesity 10/25/2011    Past Medical History  Diagnosis Date  . Hyperlipidemia   . Hypertension   . Diabetes mellitus without complication   . Prostate cancer   . Arthritis     left shoulder and left knee  . Nephrolithiasis     Past Surgical History  Procedure Laterality Date  . Prostate surgery      with seed placement  . Back surgery    . Colonoscopy N/A 07/17/2012    Procedure: COLONOSCOPY;  Surgeon: Iva Boop, MD;  Location: [facility];  Service: Endoscopy;  Laterality: N/A;    History  Substance Use Topics  . Smoking status: Current  Every Day Smoker -- 0.50 packs/day    Types: Cigarettes    Start date: 09/02/2012  . Smokeless tobacco: Never Used     Comment: wants to work on quitting again   . Alcohol Use: No    Family History  Problem Relation Age of Onset  . Cancer Mother     ovarian  . Alzheimer's disease Father   . Hypertension Sister   . Diabetes Brother   . Renal Disease Brother   . Heart disease Brother   . Diabetes Daughter   . Seizures Daughter   . Cancer Sister     ?uterine  . Hypertension Sister   . Diabetes Sister   . Hyperlipidemia Sister   . Hypertension Sister   . Colon cancer Neg Hx     No Known Allergies  Medication list has been reviewed and updated.  Current Outpatient Prescriptions on File Prior to Visit  Medication Sig Dispense Refill  . aspirin 81 MG tablet Take 81 mg by mouth daily.      Marland Kitchen OVER THE COUNTER MEDICATION OTC Aleve taking once daily.      Marland Kitchen oxybutynin (DITROPAN) 5 MG tablet TAKE ONE TABLET BY MOUTH TWICE DAILY  180 tablet  1  . furosemide (LASIX) 40 MG tablet Take 1 tablet (40 mg total) by mouth daily as needed for edema.  90 tablet  1  .  lisinopril (PRINIVIL,ZESTRIL) 10 MG tablet Take 1 tablet (10 mg total) by mouth daily.  90 tablet  1  . metFORMIN (GLUCOPHAGE) 1000 MG tablet Take 1 tablet (1,000 mg total) by mouth 2 (two) times daily with a meal.  180 tablet  1  . potassium chloride SA (KLOR-CON M20) 20 MEQ tablet Take 1 tablet (20 mEq total) by mouth daily as needed (if take furosemide (Lasix)).  90 tablet  1  . pravastatin (PRAVACHOL) 40 MG tablet Take 1 tablet (40 mg total) by mouth daily.  90 tablet  1  . [DISCONTINUED] oxybutynin (DITROPAN) 5 MG tablet Take 1 tablet (5 mg total) by mouth 2 (two) times daily.  60 tablet  5   No current facility-administered medications on file prior to visit.    Review of Systems:  As per HPI, otherwise negative.    Physical Examination: Filed Vitals:   12/30/12 1439  BP: 164/90  Pulse: 59  Temp: 97.8 F (36.6 C)   Resp: 16   There were no vitals filed for this visit. There is no weight on file to calculate BMI. Ideal Body Weight:    GEN: WDWN, NAD, Non-toxic, A & O x 3 HEENT: Atraumatic, Normocephalic. Neck supple. No masses, No LAD. Ears and Nose: No external deformity. CV: RRR, No M/G/R. No JVD. No thrill. No extra heart sounds. PULM: CTA B, no wheezes, crackles, rhonchi. No retractions. No resp. distress. No accessory muscle use. ABD: S, NT, ND, +BS. No rebound. No HSM. EXTR: No c/c/e NEURO Normal gait.  PSYCH: Normally interactive. Conversant. Not depressed or anxious appearing.  Calm demeanor.   Assessment and Plan: Lumbar strain Anaprox Flexeril Hydrocodone  Signed,  Phillips Odor, MD  Results for orders placed in visit on 12/30/12  POCT URINALYSIS DIPSTICK      Result Value Range   Color, UA yellow     Clarity, UA clear     Glucose, UA neg     Bilirubin, UA neg     Ketones, UA neg     Spec Grav, UA 1.020     Blood, UA trace     pH, UA 7.0     Protein, UA neg     Urobilinogen, UA 0.2     Nitrite, UA neg     Leukocytes, UA Negative    POCT UA - MICROSCOPIC ONLY      Result Value Range   WBC, Ur, HPF, POC neg     RBC, urine, microscopic 15-16     Bacteria, U Microscopic trace     Mucus, UA pos     Epithelial cells, urine per micros neg     Crystals, Ur, HPF, POC neg     Casts, Ur, LPF, POC neg     Yeast, UA neg

## 2013-01-04 ENCOUNTER — Encounter: Payer: Self-pay | Admitting: Internal Medicine

## 2013-01-14 ENCOUNTER — Telehealth: Payer: Self-pay | Admitting: Radiology

## 2013-01-14 ENCOUNTER — Telehealth: Payer: Self-pay

## 2013-01-14 NOTE — Telephone Encounter (Signed)
The disability determination is through social security. Also needs labs from April sent as well, which is the point he was taken out of work. Please send attention Doroteo Glassman.

## 2013-01-14 NOTE — Telephone Encounter (Signed)
PT STATES HE RECEIVED A LETTER IN THE MAIL AND WOULD LIKE TO SPEAK WITH CHELLE ABOUT IT. PLEASE CALL [phone]

## 2013-01-14 NOTE — Telephone Encounter (Signed)
Patient states he got a letter concerning his disability. This has to be reviewed by the medical board. They need additional information, labs glucose and hemoglobin A1C sent to disability determination. We have previously sent records, can you send the labs now, from August office visit?

## 2013-01-14 NOTE — Telephone Encounter (Signed)
Have sent to medical records.

## 2013-01-14 NOTE — Telephone Encounter (Signed)
Patient should not need a referral for a chiropractor, he can just go. Called him to advise.

## 2013-01-14 NOTE — Telephone Encounter (Signed)
Pt called again asking about referral for his back.   Please advise - bf

## 2013-01-14 NOTE — Telephone Encounter (Signed)
Patient called and indicates his back has not improved since his visit with you, he wants to know if he should come back in or if you want to refer, please advise.

## 2013-01-14 NOTE — Telephone Encounter (Signed)
Pt requests dr Melvyn Neth on [address] - thinks hes a [place]. Referred by a friend.   bf

## 2013-01-24 NOTE — Telephone Encounter (Signed)
Spoke with patient. His ppw has already been processed and scanned into electronic chart in Aug. 2014.

## 2013-02-22 DIAGNOSIS — Z0271 Encounter for disability determination: Secondary | ICD-10-CM

## 2013-03-05 ENCOUNTER — Encounter: Payer: Self-pay | Admitting: Physician Assistant

## 2013-03-05 DIAGNOSIS — C61 Malignant neoplasm of prostate: Secondary | ICD-10-CM

## 2013-03-12 ENCOUNTER — Ambulatory Visit: Payer: BC Managed Care – PPO | Admitting: *Deleted

## 2013-03-12 ENCOUNTER — Telehealth: Payer: Self-pay

## 2013-03-12 DIAGNOSIS — Z0271 Encounter for disability determination: Secondary | ICD-10-CM

## 2013-03-12 NOTE — Telephone Encounter (Signed)
Disability forms in Chelle's box to be completed. Should be same as 12/05/12?

## 2013-03-14 ENCOUNTER — Encounter: Payer: Self-pay | Admitting: *Deleted

## 2013-03-14 ENCOUNTER — Encounter: Payer: BC Managed Care – PPO | Attending: Physician Assistant | Admitting: *Deleted

## 2013-03-14 VITALS — Ht 63.0 in | Wt 290.0 lb

## 2013-03-14 DIAGNOSIS — E119 Type 2 diabetes mellitus without complications: Secondary | ICD-10-CM | POA: Insufficient documentation

## 2013-03-14 DIAGNOSIS — Z713 Dietary counseling and surveillance: Secondary | ICD-10-CM | POA: Insufficient documentation

## 2013-03-14 NOTE — Progress Notes (Signed)
Appt start time: 0800 end time:  0830.  Assessment:  Patient was seen on  03/15/13 for individual diabetes management follow up.Rodney Walsh notes that his FBS range 131-151 mg/dl and pp ranges 119-147 mg/dl.  MEDICATIONS: none. Physician will review need to return to medication at next visit.  DIETARY INTAKE: He is making better food choices and decreasing the volume of food. He notes that the FBS is elevated due to the "Junk I eat before I got to bed". Wt loss of #14 over the past 2 yrs. Not going back for seconds  Usual physical activity: Works a part time job working on [employer].  Intervention:  Nutrition counseling provided.  Barriers to learning/adherence to lifestyle change: Motivation  Diabetes self-care support plan:   [facility] support group  Wife  Monitoring/Evaluation:  Dietary intake, exercise, glucose monitor, and body weight prn.Folllow up with primary care physician as directed.Continue glucose testing at different times of the day to include FBS and 2hpp on alternating days.

## 2013-03-15 NOTE — Telephone Encounter (Signed)
Paperwork completed. LMOM for pick up.

## 2013-03-26 ENCOUNTER — Encounter: Payer: Self-pay | Admitting: Physician Assistant

## 2013-03-26 ENCOUNTER — Ambulatory Visit (INDEPENDENT_AMBULATORY_CARE_PROVIDER_SITE_OTHER): Payer: BC Managed Care – PPO | Admitting: Physician Assistant

## 2013-03-26 VITALS — BP 178/102 | HR 77 | Temp 98.3°F | Resp 16 | Ht 64.0 in | Wt 290.2 lb

## 2013-03-26 DIAGNOSIS — R05 Cough: Secondary | ICD-10-CM

## 2013-03-26 DIAGNOSIS — IMO0001 Reserved for inherently not codable concepts without codable children: Secondary | ICD-10-CM

## 2013-03-26 DIAGNOSIS — M25532 Pain in left wrist: Secondary | ICD-10-CM

## 2013-03-26 DIAGNOSIS — E785 Hyperlipidemia, unspecified: Secondary | ICD-10-CM

## 2013-03-26 DIAGNOSIS — R059 Cough, unspecified: Secondary | ICD-10-CM

## 2013-03-26 DIAGNOSIS — M25539 Pain in unspecified wrist: Secondary | ICD-10-CM

## 2013-03-26 DIAGNOSIS — Z23 Encounter for immunization: Secondary | ICD-10-CM

## 2013-03-26 DIAGNOSIS — Z1159 Encounter for screening for other viral diseases: Secondary | ICD-10-CM

## 2013-03-26 DIAGNOSIS — I10 Essential (primary) hypertension: Secondary | ICD-10-CM

## 2013-03-26 LAB — COMPREHENSIVE METABOLIC PANEL
ALT: 17 U/L (ref 0–53)
BUN: 12 mg/dL (ref 6–23)
CO2: 27 mEq/L (ref 19–32)
Chloride: 99 mEq/L (ref 96–112)
Creat: 0.89 mg/dL (ref 0.50–1.35)
Total Bilirubin: 0.5 mg/dL (ref 0.3–1.2)

## 2013-03-26 LAB — CBC WITH DIFFERENTIAL/PLATELET
Basophils Absolute: 0 10*3/uL (ref 0.0–0.1)
Basophils Relative: 1 % (ref 0–1)
Eosinophils Absolute: 0.1 10*3/uL (ref 0.0–0.7)
Eosinophils Relative: 2 % (ref 0–5)
HCT: 47.1 % (ref 39.0–52.0)
Hemoglobin: 16.2 g/dL (ref 13.0–17.0)
MCH: 31 pg (ref 26.0–34.0)
MCHC: 34.4 g/dL (ref 30.0–36.0)
MCV: 90.2 fL (ref 78.0–100.0)
Monocytes Absolute: 0.5 10*3/uL (ref 0.1–1.0)
Monocytes Relative: 13 % — ABNORMAL HIGH (ref 3–12)
Neutro Abs: 2.2 10*3/uL (ref 1.7–7.7)
Platelets: 233 10*3/uL (ref 150–400)
RDW: 13.2 % (ref 11.5–15.5)

## 2013-03-26 LAB — LIPID PANEL
Cholesterol: 171 mg/dL (ref 0–200)
HDL: 42 mg/dL (ref >39)
Total CHOL/HDL Ratio: 4.1 Ratio
VLDL: 23 mg/dL (ref 0–40)

## 2013-03-26 LAB — POCT GLYCOSYLATED HEMOGLOBIN (HGB A1C): Hemoglobin A1C: 6.9

## 2013-03-26 MED ORDER — BENZONATATE 100 MG PO CAPS: 100.0000 mg | ORAL_CAPSULE | Freq: Three times a day (TID) | ORAL | Status: DC | PRN

## 2013-03-26 NOTE — Patient Instructions (Signed)
Please re-start the lisinopril. Keep checking your blood pressure, about 1 time each week.  Write down the readings, and bring that with you at your next visit. Your sugar has gone up a little, but we can hold off on restarting the metformin for now.  That means that you have to keep on working on healthy eating and regular exercise. Depending on what your labs show, I may advise you to restart the medication for cholesterol.  I will contact you with your lab results as soon as they are available.   If you have not heard from me in 2 weeks, please contact me.  The fastest way to get your results is to register for My Chart (see the instructions on the last page of this printout).

## 2013-03-26 NOTE — Progress Notes (Signed)
Subjective:    Patient ID: Rodney Walsh, male    DOB: 03/19/1951, 62 y.o.   MRN: [id number]  HPI This 62 y.o. male presents for evaluation of DM type 2, HTN, hyperlipidemia and of a new swelling and tenderness of the LEFT forearm, just proximal to the wrist.  He reports breaking his arm at this location as a child.  He self d/c'd lisinopril, pravastatin and metformin in 10/2012. I agreed to support this as long as he could maintain a controlled BP and glucose. At his visit in 12/2012, his BP was 140/90, fasting glucose was 128 and A1C was 6.6%.  The pain and swelling in the LEFT wrist has been present for a couple of weeks.  No better with Aleve.  Some improvement with an ACE wrap.  No change in his activities.  No recent trauma or injury that he can recall.  Patient Active Problem List   Diagnosis Date Noted  . Cataract 09/25/2012  . OSA (obstructive sleep apnea) 08/10/2012  . Eczema 08/10/2012  . Vitamin D deficiency 08/10/2012  . Erectile dysfunction 08/10/2012  . Personal history of adenomatous colonic polyps 04/08/2012  . Prostate cancer 01/04/2012  . Type II or unspecified type diabetes mellitus without mention of complication, uncontrolled 10/25/2011  . HTN (hypertension) 10/25/2011  . Hyperlipidemia LDL goal <70 10/25/2011  . Morbid obesity 10/25/2011    Review of Systems He's had a recent "head cold" which has resolved except for a cough.  Tessalon Perles have worked well for him previously and he requests that to help him "get over it."  Denies chest pain, shortness of breath, HA, dizziness, vision change, nausea, vomiting, diarrhea, constipation, melena, hematochezia, dysuria, increased urinary urgency or frequency, increased hunger or thirst, unintentional weight change, rash.     Objective:   Physical Exam Blood pressure 178/102, pulse 77, temperature 98.3 F (36.8 C), temperature source Oral, resp. rate 16, height 5\' 4"  (1.626 m), weight 290 lb 3.2 oz (131.634 kg),  SpO2 97.00%. Body mass index is 49.79 kg/(m^2). Well-developed, well nourished BM who is awake, alert and oriented, in NAD. Weight is stable. HEENT: [place]/AT, sclera and conjunctiva are clear.  EAC are patent, TMs are normal in appearance. Nasal mucosa is pink and moist. OP is clear. Neck: supple, non-tender, no lymphadenopathy, thyromegaly. Heart: RRR, no murmur Lungs: normal effort, CTA Abdomen: normo-active bowel sounds, supple, non-tender, no mass or organomegaly. Extremities: no cyanosis, clubbing or edema. There is swelling of the radial aspect of the LEFT forearm, proximal to the wrist.  Mildly tender. No ecchymosis.  FROM, Pain with ulnar deviation of a thumb-in fist. Skin: warm and dry without rash. Psychologic: good mood and appropriate affect, normal speech and behavior.    Results for orders placed in visit on 03/26/13  GLUCOSE, POCT (MANUAL RESULT ENTRY)      Result Value Range   POC Glucose 146 (*) 70 - 99 mg/dl  POCT GLYCOSYLATED HEMOGLOBIN (HGB A1C)      Result Value Range   Hemoglobin A1C 6.9         Assessment & Plan:  Type II or unspecified type diabetes mellitus without mention of complication, uncontrolled - Plan: POCT glucose (manual entry), POCT glycosylated hemoglobin (Hb A1C), Comprehensive metabolic panel, Microalbumin, urine  Need for prophylactic vaccination and inoculation against influenza - Plan: Flu Vaccine QUAD 36+ mos IM  HTN (hypertension), uncontrolled. - Plan: CBC with Differential; RESTART lisinopril.  Hyperlipidemia LDL goal <70 - Plan: Lipid panel  Morbid obesity -  increased exercise, continued healthy eating changes.  Need for hepatitis C screening test - Plan: Hepatitis C antibody  Cough - Plan: benzonatate (TESSALON) 100 MG capsule  Wrist pain, left, possible deQuervain's tenosynovitis - plan: Thumb spica splint.  RTC if symptoms worsen/persist in 2 weeks.  Fernande Bras, PA-C Physician Assistant-Certified [facility]

## 2013-03-27 ENCOUNTER — Encounter: Payer: Self-pay | Admitting: Physician Assistant

## 2013-03-27 LAB — HEPATITIS C ANTIBODY: HCV Ab: NEGATIVE

## 2013-03-29 ENCOUNTER — Encounter: Payer: Self-pay | Admitting: Physician Assistant

## 2013-04-11 DIAGNOSIS — Z0271 Encounter for disability determination: Secondary | ICD-10-CM

## 2013-04-12 ENCOUNTER — Telehealth: Payer: Self-pay

## 2013-04-12 NOTE — Telephone Encounter (Signed)
Disability ppwk in Chelle's box.

## 2013-04-17 NOTE — Telephone Encounter (Signed)
Disability ppw has been signed by [facility] and patient has been notified that it is ready for pick up. Copy of ppw has been made.

## 2013-05-17 ENCOUNTER — Telehealth: Payer: Self-pay

## 2013-05-17 NOTE — Telephone Encounter (Signed)
Disability ppwk is in Chelle's box for approval. I copied everything from last time, we just need Chelle to OK it and sign.

## 2013-05-18 NOTE — Telephone Encounter (Signed)
Patient called stated he called this past Tues or Wed in regards to disability papers And has not received a call back. These papers are needed today the 10th Please let patient know when ready. [phone]

## 2013-05-19 NOTE — Telephone Encounter (Signed)
Chelle will be here Monday at 10.

## 2013-05-20 ENCOUNTER — Telehealth: Payer: Self-pay | Admitting: *Deleted

## 2013-05-20 NOTE — Telephone Encounter (Signed)
Received [place] [facility] documents. Dr.Daub asked [place] to call to make sure pt is following up. Pt has an appt next month with Chelle.  Sent document to be scanned into chart.

## 2013-05-20 NOTE — Telephone Encounter (Signed)
Forms are signed

## 2013-06-06 DIAGNOSIS — Z0271 Encounter for disability determination: Secondary | ICD-10-CM

## 2013-06-12 ENCOUNTER — Telehealth: Payer: Self-pay

## 2013-06-12 NOTE — Telephone Encounter (Signed)
Short-term disability ppwk ready to be picked up.

## 2013-06-13 ENCOUNTER — Encounter: Payer: Self-pay | Admitting: Physician Assistant

## 2013-06-14 ENCOUNTER — Encounter: Payer: Self-pay | Admitting: Physician Assistant

## 2013-06-20 DIAGNOSIS — Z0271 Encounter for disability determination: Secondary | ICD-10-CM

## 2013-07-02 ENCOUNTER — Ambulatory Visit (INDEPENDENT_AMBULATORY_CARE_PROVIDER_SITE_OTHER): Payer: BC Managed Care – PPO | Admitting: Physician Assistant

## 2013-07-02 ENCOUNTER — Ambulatory Visit: Payer: BC Managed Care – PPO

## 2013-07-02 ENCOUNTER — Encounter: Payer: Self-pay | Admitting: Physician Assistant

## 2013-07-02 VITALS — BP 146/100 | HR 81 | Temp 98.6°F | Resp 18 | Ht 62.5 in | Wt 291.2 lb

## 2013-07-02 DIAGNOSIS — M25532 Pain in left wrist: Secondary | ICD-10-CM

## 2013-07-02 DIAGNOSIS — I10 Essential (primary) hypertension: Secondary | ICD-10-CM

## 2013-07-02 DIAGNOSIS — M25539 Pain in unspecified wrist: Secondary | ICD-10-CM

## 2013-07-02 DIAGNOSIS — Z23 Encounter for immunization: Secondary | ICD-10-CM

## 2013-07-02 DIAGNOSIS — E785 Hyperlipidemia, unspecified: Secondary | ICD-10-CM

## 2013-07-02 DIAGNOSIS — IMO0001 Reserved for inherently not codable concepts without codable children: Secondary | ICD-10-CM

## 2013-07-02 DIAGNOSIS — L309 Dermatitis, unspecified: Secondary | ICD-10-CM

## 2013-07-02 DIAGNOSIS — L259 Unspecified contact dermatitis, unspecified cause: Secondary | ICD-10-CM

## 2013-07-02 DIAGNOSIS — N529 Male erectile dysfunction, unspecified: Secondary | ICD-10-CM

## 2013-07-02 DIAGNOSIS — E1165 Type 2 diabetes mellitus with hyperglycemia: Principal | ICD-10-CM

## 2013-07-02 LAB — COMPLETE METABOLIC PANEL WITH GFR
ALK PHOS: 88 U/L (ref 39–117)
ALT: 23 U/L (ref 0–53)
AST: 19 U/L (ref 0–37)
Albumin: 4.4 g/dL (ref 3.5–5.2)
BILIRUBIN TOTAL: 0.6 mg/dL (ref 0.2–1.2)
BUN: 12 mg/dL (ref 6–23)
CO2: 29 mEq/L (ref 19–32)
CREATININE: 1.08 mg/dL (ref 0.50–1.35)
Calcium: 9.2 mg/dL (ref 8.4–10.5)
Chloride: 97 mEq/L (ref 96–112)
GFR, EST AFRICAN AMERICAN: 84 mL/min
GFR, Est Non African American: 73 mL/min
Glucose, Bld: 326 mg/dL — ABNORMAL HIGH (ref 70–99)
Potassium: 4.8 mEq/L (ref 3.5–5.3)
Sodium: 136 mEq/L (ref 135–145)
Total Protein: 7.2 g/dL (ref 6.0–8.3)

## 2013-07-02 LAB — GLUCOSE, POCT (MANUAL RESULT ENTRY): POC GLUCOSE: 332 mg/dL — AB (ref 70–99)

## 2013-07-02 LAB — LIPID PANEL
Cholesterol: 179 mg/dL (ref 0–200)
HDL: 44 mg/dL (ref >39)
LDL CALC: 107 mg/dL — AB (ref 0–99)
TRIGLYCERIDES: 140 mg/dL (ref <150)
Total CHOL/HDL Ratio: 4.1 Ratio
VLDL: 28 mg/dL (ref 0–40)

## 2013-07-02 LAB — POCT GLYCOSYLATED HEMOGLOBIN (HGB A1C): HEMOGLOBIN A1C: 8.9

## 2013-07-02 MED ORDER — TADALAFIL 20 MG PO TABS: 20.0000 mg | ORAL_TABLET | Freq: Every day | ORAL | Status: DC | PRN

## 2013-07-02 MED ORDER — METFORMIN HCL 1000 MG PO TABS: 1000.0000 mg | ORAL_TABLET | Freq: Two times a day (BID) | ORAL | Status: DC

## 2013-07-02 MED ORDER — CLOBETASOL PROPIONATE 0.05 % EX CREA: 1.0000 "application " | TOPICAL_CREAM | Freq: Two times a day (BID) | CUTANEOUS | Status: DC | PRN

## 2013-07-02 MED ORDER — ZOSTER VACCINE LIVE 19400 UNT/0.65ML ~~LOC~~ SOLR: 0.6500 mL | Freq: Once | SUBCUTANEOUS | Status: DC

## 2013-07-02 MED ORDER — LISINOPRIL 10 MG PO TABS: 10.0000 mg | ORAL_TABLET | Freq: Every day | ORAL | Status: DC

## 2013-07-02 NOTE — Progress Notes (Signed)
Subjective:    Patient ID: Rodney Walsh, male    DOB: 05-23-1950, 63 y.o.   MRN: [id number]   PCP: Brandi Tomlinson, PA-C  Chief Complaint  Patient presents with  . 3 month follow up    diabetes, hypertension    Medications, allergies, past medical history, surgical history, family history, social history and problem list reviewed and updated.   HPI  Presents for DM follow-up.  Frequency of home glucose monitoring: inconsistent Glucose has been higher since the weather has been cooler and he's not getting as much exercise. He also notes that he's been snacking on chips and M&M's in the evenings. Sees a dentist infrequently (has been multiple years), eye specialist annually (05/2013). Checks feet daily. Is current with influenza vaccine. Is current with pneumococcal vaccine.   Wrist pain continues.  Pain is along the thumb, through the wrist and partway up the forearm, but only with certain movements.  Especially worse with grasping.  Not tender. The thumb-spica splint prescribed at his last visit helps, but only when he wears it, which is intermittent. Recall he has history of fracture of the wrist when he was a child.  He reports that for a long time, "there was a bump there, and then finally it went away" and points to the distal radius.  Not sleeping well. Gets up to urinate several times at night.  Only takes Ditropan QAM, rather than the prescribed BID.  Home BP regularly.  Home cuff memory reviewed. Highest reading 160/94, when he was having increased wrist pain.  Most readings 130's/80's.  Wife has retired. She is depressed, but they are getting along at home better than he expected.   Review of Systems As above.  Denies chest pain, shortness of breath, HA, dizziness, vision change, nausea, vomiting, diarrhea, constipation, melena, hematochezia, dysuria, increased urinary urgency or frequency, increased hunger or thirst, unintentional weight change, new myalgias or  arthralgias, rash.     Objective:   Physical Exam  Constitutional: He is oriented to person, place, and time. Vital signs are normal. He appears well-developed and well-nourished. He is active and cooperative. No distress.  Obese; weight is stable  HENT:  Head: Normocephalic and atraumatic.  Right Ear: Hearing normal.  Left Ear: Hearing normal.  Eyes: EOM are normal. Pupils are equal, round, and reactive to light.  Neck: Normal range of motion. Neck supple. No thyromegaly present.  Cardiovascular: Normal rate, regular rhythm and normal heart sounds.   Pulses:      Radial pulses are 2+ on the right side, and 2+ on the left side.       Dorsalis pedis pulses are 2+ on the right side, and 2+ on the left side.       Posterior tibial pulses are 2+ on the right side, and 2+ on the left side.  Pulmonary/Chest: Effort normal and breath sounds normal.  Musculoskeletal:       Left wrist: He exhibits normal range of motion, no tenderness, no bony tenderness, no swelling, no effusion, no crepitus, no deformity and no laceration.       Left hand: Normal. Normal sensation noted. Normal strength noted.  Lymphadenopathy:       Head (right side): No tonsillar, no preauricular, no posterior auricular and no occipital adenopathy present.       Head (left side): No tonsillar, no preauricular, no posterior auricular and no occipital adenopathy present.    He has no cervical adenopathy.       Right:  No supraclavicular adenopathy present.       Left: No supraclavicular adenopathy present.  Neurological: He is alert and oriented to person, place, and time. No sensory deficit.  Skin: Skin is warm, dry and intact. No rash noted. No cyanosis or erythema. Nails show no clubbing.     Multiple patches of hyper/hypopigmentation and hyperkeratosis consistent with chronic eczema.  See diabetic foot exam.   Psychiatric: He has a normal mood and affect.      Results for orders placed in visit on 07/02/13    GLUCOSE, POCT (MANUAL RESULT ENTRY)      Result Value Ref Range   POC Glucose 332 (*) 70 - 99 mg/dl  POCT GLYCOSYLATED HEMOGLOBIN (HGB A1C)      Result Value Ref Range   Hemoglobin A1C 8.9     LEFT Wrist: [facility] reading (PRIMARY) by  Dr. Laney Pastor.  Evidence of previous distal radius and ulnar fractures.  Loss of joint space between the scaphoid and lunate.      Assessment & Plan:  1. Type II or unspecified type diabetes mellitus without mention of complication, uncontrolled Needs increased exercise and elimination of empty calorie/high sugar snacks.  Counseled at length. Will add another agent if A1C remains >7.5 at next visit. - HM Diabetes Foot Exam - POCT glucose (manual entry) - POCT glycosylated hemoglobin (Hb A1C)  2. HTN (hypertension) Above goal today-hasn't taken medication yet today - COMPLETE METABOLIC PANEL WITH GFR - lisinopril (PRINIVIL,ZESTRIL) 10 MG tablet; Take 1 tablet (10 mg total) by mouth daily.  Dispense: 90 tablet; Refill: 1  3. Hyperlipidemia LDL goal <70 Await labs - Lipid panel - metFORMIN (GLUCOPHAGE) 1000 MG tablet; Take 1 tablet (1,000 mg total) by mouth 2 (two) times daily with a meal.  Dispense: 180 tablet; Refill: 1  4. Morbid obesity Counseled on the importance of weight loss, healthy eating and regular exercise.    5. Wrist pain, left Suspect DeQuervain's Tenosynovitis, but expected more improvement with splinting.  It's possible his old distal radius/ulnar fracture has caused the changes we're seeing on today's films, and that he's been non-compliant with the splint. Refer to Hand for additional evaluation and treatment, possibly injection.   - DG Wrist Complete Left; Future - Ambulatory referral to Hand Surgery  6. Need for shingles vaccine - zoster vaccine live, PF, (ZOSTAVAX) 01779 UNT/0.65ML injection; Inject 19,400 Units into the skin once.  Dispense: 0.65 mL; Refill: 0  7. Erectile dysfunction - tadalafil (CIALIS) 20 MG tablet; Take 1  tablet (20 mg total) by mouth daily as needed for erectile dysfunction.  Dispense: 30 tablet; Refill: 1  9. Eczema Continue current treatment.   Fara Chute, PA-C Physician Assistant-Certified [facility]

## 2013-07-02 NOTE — Patient Instructions (Signed)
You need to increase your excerise. Please work toward 150 minutes daily. Please cut back on the unhealthy snacking.  It's killing you.

## 2013-07-03 ENCOUNTER — Encounter: Payer: Self-pay | Admitting: Physician Assistant

## 2013-07-04 ENCOUNTER — Telehealth: Payer: Self-pay

## 2013-07-04 NOTE — Telephone Encounter (Signed)
Patient states that he did not get a RX yesterday for his sleep medicine. Also states that his Cialis is $1,200 and he cannot afford that. Is there another option for him? [employer].  [phone]

## 2013-07-08 MED ORDER — TRAZODONE HCL 100 MG PO TABS: 100.0000 mg | ORAL_TABLET | Freq: Every evening | ORAL | Status: DC | PRN

## 2013-07-08 NOTE — Telephone Encounter (Signed)
lmom that rx was sent in for sleep medicine

## 2013-07-08 NOTE — Telephone Encounter (Signed)
The sleep medication is trazodone.  I printed out a coupon for him to get 3 free tabs of cialis and put it up front.

## 2013-07-08 NOTE — Telephone Encounter (Signed)
Clarify that the "sleep medicine" is Trazodone.  His options are Viagra and Levitra. Which would he prefer?

## 2013-07-08 NOTE — Telephone Encounter (Signed)
Meds ordered this encounter  Medications  . traZODone (DESYREL) 100 MG tablet    Sig: Take 1 tablet (100 mg total) by mouth at bedtime as needed for sleep.    Dispense:  90 tablet    Refill:  1    Order Specific Question:  Supervising Provider    Answer:  DOOLITTLE, ROBERT P [[id number]   Thanks for the coupon.

## 2013-07-10 DIAGNOSIS — Z0271 Encounter for disability determination: Secondary | ICD-10-CM

## 2013-07-13 ENCOUNTER — Encounter: Payer: Self-pay | Admitting: Physician Assistant

## 2013-07-13 ENCOUNTER — Telehealth: Payer: Self-pay

## 2013-07-13 NOTE — Telephone Encounter (Signed)
pts FMLA ppw has been completed and are ready for p/u, pt has been contacted and is aware that forms are located in the p/u drawer at [address].  [employer]

## 2013-08-07 ENCOUNTER — Ambulatory Visit (INDEPENDENT_AMBULATORY_CARE_PROVIDER_SITE_OTHER): Payer: BC Managed Care – PPO | Admitting: Physician Assistant

## 2013-08-07 ENCOUNTER — Encounter: Payer: Self-pay | Admitting: Physician Assistant

## 2013-08-07 ENCOUNTER — Telehealth: Payer: Self-pay

## 2013-08-07 VITALS — BP 164/90 | HR 72 | Temp 97.9°F | Resp 16 | Ht 63.0 in | Wt 283.0 lb

## 2013-08-07 DIAGNOSIS — Z0271 Encounter for disability determination: Secondary | ICD-10-CM

## 2013-08-07 DIAGNOSIS — E1165 Type 2 diabetes mellitus with hyperglycemia: Principal | ICD-10-CM

## 2013-08-07 DIAGNOSIS — Z23 Encounter for immunization: Secondary | ICD-10-CM

## 2013-08-07 DIAGNOSIS — IMO0001 Reserved for inherently not codable concepts without codable children: Secondary | ICD-10-CM

## 2013-08-07 DIAGNOSIS — I10 Essential (primary) hypertension: Secondary | ICD-10-CM

## 2013-08-07 MED ORDER — ZOSTER VACCINE LIVE 19400 UNT/0.65ML ~~LOC~~ SOLR: 0.6500 mL | Freq: Once | SUBCUTANEOUS | Status: DC

## 2013-08-07 MED ORDER — LISINOPRIL 20 MG PO TABS: 20.0000 mg | ORAL_TABLET | Freq: Every day | ORAL | Status: DC

## 2013-08-07 NOTE — Telephone Encounter (Signed)
FYI. Pt states his sugar has been running over 300 for "a while" now and this morning it was 417. He states that no matter what he eats he feels it is past the point of being able to control it. He states he needs insulin now. Pt advised to RTC for evaluation- Chelle is in the clinic today. Pt agrees.

## 2013-08-07 NOTE — Telephone Encounter (Signed)
lmom that rx for lisinopril was sent in and that he should have been given the rx for shingles.  Chelle just FYI about the disability papers in your box

## 2013-08-07 NOTE — Telephone Encounter (Signed)
Patient cannot get his sugar down it is over 300 .   Needs insulin.  Wants to talk with Chelle.   [phone]

## 2013-08-07 NOTE — Telephone Encounter (Signed)
I've signed the papers and put them up front for him to pick up.  I sent the lisinopril (new dose is 20 mg) and the new medication (Onglyza) to the pharmacy for him.  The Zonstavax presription was printed and given to him (it's on BLUE paper) while he was in the office today.

## 2013-08-07 NOTE — Telephone Encounter (Signed)
Agree with advice given

## 2013-08-07 NOTE — Patient Instructions (Addendum)
1. Check your blood sugar two times each day. Check it When you wake up, before you have anything to eat or drink and again 2-3 hours after your largest meal of the day. Your goal in the mornings is 80-140, and after your largest meal of the day, 140-180.  2. Eliminate sugary drinks (including juices). You may drink sugar-free drinks, water, coffee, unsweetened tea. You may use "zero-calorie" sugar substitute sweeteners. Make sure you are drinking at least 64 ounces each day.  3. Measure your food portions.   4. Try to cut back on your desserts. You've done a good job reducing from 2 to 1 sweet treat each evening, but since your sugar is still too high, we need to reduce it further.  5. I've referred you back to Ms. Reavis for more help with eating choices.  6. Take all your medications every day, as prescribed. Use medication box to help you remember to take your medications.  7. ADD the new medicine: Onglyza 5 mg each day.  8. Exercise. Your first goal is to walk 150 minutes each week.  9. Increase the lisinopril to 20 mg. I've sent a new prescription to the pharmacy. You can use up the 10 mg tablets you have by taking 2 together each day.  10. If you haven't heard from the Hand specialist in 1 week, please contact this office so we can look into it again.  11. I've given you a new prescription for the Shingles vaccine (Zostavax).

## 2013-08-07 NOTE — Progress Notes (Signed)
Subjective:    Patient ID: Rodney Walsh, male    DOB: [birth year], 63 y.o.   MRN: [id number]   PCP: Harrison Paulson, PA-C  Chief Complaint  Patient presents with  . Diabetes    Blood sugar 417 this morning    Medications, allergies, past medical history, surgical history, family history, social history and problem list reviewed and updated.   HPI  Rodney Walsh is here concerned about his glucose.  He reports that he's tried "everything" and nothing is helping. 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 He saw a nutritionist last summer, who gave excellent advice, but he didn't go back and appears to have forgotten or misunderstood some instructions. His wife also saw a nutritionist, with follow up visits, and was successful with weight loss during that time.  Review of his glucometer reveals most readings 350-450, though multiple readings are 450-500, and only 2 in the last 1 month <140.  24-hour recall: Breakfast this morning: 2 fried eggs, 3 strips bacon, 1/2 slice bread, 4 oz. POM Wonderful, coffee + Splenda Snack last night: 1/2 pint ice cream Supper yesterday: 1/2 BLT sandwich 2 pm snack: can of sardines on 8-10 crackers 10:30 am BLT sandwich and 1 Quart Gatorade  He notes that breakfasts are varied, and may be cereal and coffee or a bagel + butter and coffee. Lunch is often a [place] sandwich. Supper is varied and other evenings may be a chef salad or a piece of fried chicken and potato salad. Gatorade is not typical, "I've tried HiC punch and Ginger Ale," usually 32 ounces/day, as an alternative to cola. For the past 1-2 weeks, he's reduced his evening dessert from BOTH cake and ice cream to cake OR ice cream.  He's having urinary urgency and frequency, weight loss, and loss of appetite. He's noted reduced concentration and short term memory ("Sometimes I can't remember if I've taken  my medicine"). He has a daily pill box that he isn't using, but plans to.  Review of Systems Denies chest pain, shortness of breath, HA, dizziness, vision change, nausea, vomiting, diarrhea, constipation, melena, hematochezia, dysuria, unexplained myalgias or arthralgias, rash.  He has not heard back from Hand-I referred him at his last visit, and called when he hadn't heard from them. He was advised that someone would call him back, but he reports no call.     Objective:   Physical Exam  Vitals reviewed. Constitutional: He is oriented to person, place, and time. He appears well-developed and well-nourished. No distress (but tearful discussing this uncontrolled glucose).  BP 164/90  Pulse 72  Temp(Src) 97.9 F (36.6 C) (Oral)  Resp 16  Ht 5\' 3"  (1.6 m)  Wt 283 lb (128.368 kg)  BMI 50.14 kg/m2  SpO2 95%   Eyes: Conjunctivae are normal. No scleral icterus.  Neck: No thyromegaly present.  Cardiovascular: Normal rate, regular rhythm and normal heart sounds.   Pulmonary/Chest: Effort normal and breath sounds normal.  Lymphadenopathy:    He has no cervical adenopathy.  Neurological: He is alert and oriented to person, place, and time.  Skin: Skin is warm and dry.  Psychiatric: His speech is normal and behavior is normal. His mood appears not anxious. His affect is not angry, not blunt, not labile and not inappropriate. He exhibits a depressed mood. He expresses no homicidal and no suicidal ideation.       Assessment & Plan:  1. Type II or unspecified type diabetes mellitus without mention of complication, uncontrolled Continue metformin 1000 mg BID.  Add Onglyza. Counseled extensively about his eating choices and the need for regular physical activity. - Ambulatory referral to diabetic education  2. HTN (hypertension) Above goal. Increase lisinopril from 10 to 20 mg. - lisinopril (PRINIVIL,ZESTRIL) 20 MG tablet; Take 1 tablet (20 mg total) by mouth daily.  Dispense: 90 tablet;  Refill: 1  3. Need for shingles vaccine He lost the Rx I gave him, so re-ordered. - zoster vaccine live, PF, (ZOSTAVAX) 30865 UNT/0.65ML injection; Inject 19,400 Units into the skin once.  Dispense: 0.65 mL; Refill: 0  Patient Instructions  1. Check your blood sugar two times each day. Check it When you wake up, before you have anything to eat or drink and again 2-3 hours after your largest meal of the day. Your goal in the mornings is 80-140, and after your largest meal of the day, 140-180.  2. Eliminate sugary drinks (including juices). You may drink sugar-free drinks, water, coffee, unsweetened tea. You may use "zero-calorie" sugar substitute sweeteners. Make sure you are drinking at least 64 ounces each day.  3. Measure your food portions.   4. Try to cut back on your desserts. You've done a good job reducing from 2 to 1 sweet treat each evening, but since your sugar is still too high, we need to reduce it further.  5. I've referred you back to Ms. Reavis for more help with eating choices.  6. Take all your medications every day, as prescribed. Use medication box to help you remember to take your medications.  7. ADD the new medicine: Onglyza 5 mg each day.  8. Exercise. Your first goal is to walk 150 minutes each week.  9. Increase the lisinopril to 20 mg. I've sent a new prescription to the pharmacy. You can use up the 10 mg tablets you have by taking 2 together each day.  10. If you haven't heard from the Hand specialist in 1 week, please contact this office so we can look into it again.  11. I've given you a new prescription for the Shingles vaccine (Zostavax).    Fara Chute, PA-C Physician Assistant-Certified [facility]

## 2013-08-07 NOTE — Telephone Encounter (Signed)
Patient was seen today by Rodney Walsh. States that 2 scripts we to be sent to [facility] on [address] (Lisinopril and Zostavax). He states that only one made it to the pharmacy however he was unsure which one is there and which one is not. Patient also dropped off disability paperwork and I placed it Chelle's box for signature.   [phone]

## 2013-08-08 MED ORDER — SAXAGLIPTIN HCL 5 MG PO TABS: 5.0000 mg | ORAL_TABLET | Freq: Every day | ORAL | Status: DC

## 2013-08-08 NOTE — Progress Notes (Signed)
Appointment made for 09/05/13 @ 1:45pm with Daphane Shepherd.

## 2013-08-08 NOTE — Telephone Encounter (Signed)
Spoke to pt- paperwork is ready to be picked up Reminded him to come in around 4/28 for a follow up Onglyza sent to the pharm per last OV with Chelle.  Pt has found the script for his shingles vaccination.  He states the lisinopril is working very well for him

## 2013-08-20 ENCOUNTER — Encounter: Payer: BC Managed Care – PPO | Attending: Physician Assistant | Admitting: *Deleted

## 2013-08-20 DIAGNOSIS — IMO0001 Reserved for inherently not codable concepts without codable children: Secondary | ICD-10-CM

## 2013-08-20 DIAGNOSIS — Z713 Dietary counseling and surveillance: Secondary | ICD-10-CM | POA: Insufficient documentation

## 2013-08-20 DIAGNOSIS — E119 Type 2 diabetes mellitus without complications: Secondary | ICD-10-CM | POA: Insufficient documentation

## 2013-08-20 DIAGNOSIS — E1165 Type 2 diabetes mellitus with hyperglycemia: Secondary | ICD-10-CM

## 2013-08-20 NOTE — Progress Notes (Signed)
Appt start time: 0900 end time:  0930.  Assessment:  Patient was seen on  08/20/13 for individual diabetes management follow up. Mr. Olivero states that his glucose was in the 400s recently, which prompted a visit to his provider   MEDICATIONS: see list  DIETARY INTAKE:  Quit eating bread and when he eats he eats pumpernickle, which he says has less carbohydrates than other breads.  He was drinking a fair amount of juice, bur recently stopped.  He is eating more salads and apples with peanut butter.  He uses less salad dressing and uses a lower-fat dressing.  He is trying to measure out his peanut portions and limit desserts.  However, he's still having one sweet treat most nights.  He states he's trying to drink more water  B: frosted flakes with 2% milk L: fish or chicken.  Maybe bowl of soup with crackers sometimes.  Sometimes vienna sausages with crackers.  Sometimes 2 packs ramen noodles.  Sometimes sandwich on pumpernickle bread D: salad with [place] breast meat.  Sometimes soup.  Sometimes pancakes (3 thin) with kayro syrup    Usual physical activity: Works a part time job working on Engineer, site.  joined the [employer] and plans to start going tomorrow with the [employer] program  Intervention:  Nutrition counseling provided.  Trasean says he is so sleepy after eating.  He doesn't have the energy to exercise.  According to Jenny Reichmann, his provider recommended 5-hr energy shots.  I feel this is too much caffeine and he should fix his eating habits so he won't feel so sleepy after meals  Goals: 1. Get out an get walking every day!!! 2. Aim for vegetables every day: green beans, salad, broccoli, greens, squash, cucumbers, tomatoes) 3. Try non-sugary cereal in the mornings like cheerios with fruit and Splenda 4. When you're craving something sweet, try to get some fruit or sugar-free pudding  5. If still not sleeping well, Consider asking Chelle about referral to  urologist  Barriers to learning/adherence to lifestyle change: Motivation  Diabetes self-care support plan:  Wife  Monitoring/Evaluation:  Dietary intake, exercise, glucose monitor, and body weight prn.Folllow up with primary care physician as directed.

## 2013-08-20 NOTE — Patient Instructions (Signed)
1. Get out an get walking every day!!! 2. Aim for vegetables every day: green beans, salad, broccoli, greens, squash, cucumbers, tomatoes) 3. Try non-sugary cereal in the mornings like cheerios with fruit and Splenda 4. When you're craving something sweet, try to get some fruit or sugar-free pudding  5. If still not sleeping well, Consider asking Chelle about referral to urologist

## 2013-09-05 ENCOUNTER — Encounter: Payer: Self-pay | Admitting: Physician Assistant

## 2013-09-05 ENCOUNTER — Ambulatory Visit (INDEPENDENT_AMBULATORY_CARE_PROVIDER_SITE_OTHER): Payer: BC Managed Care – PPO | Admitting: Physician Assistant

## 2013-09-05 VITALS — BP 129/57 | HR 67 | Temp 98.0°F | Resp 16 | Ht 63.0 in | Wt 284.0 lb

## 2013-09-05 DIAGNOSIS — E785 Hyperlipidemia, unspecified: Secondary | ICD-10-CM

## 2013-09-05 DIAGNOSIS — IMO0001 Reserved for inherently not codable concepts without codable children: Secondary | ICD-10-CM

## 2013-09-05 DIAGNOSIS — E1165 Type 2 diabetes mellitus with hyperglycemia: Principal | ICD-10-CM

## 2013-09-05 DIAGNOSIS — I10 Essential (primary) hypertension: Secondary | ICD-10-CM

## 2013-09-05 LAB — COMPLETE METABOLIC PANEL WITH GFR
ALT: 23 U/L (ref 0–53)
AST: 20 U/L (ref 0–37)
Albumin: 4.2 g/dL (ref 3.5–5.2)
Alkaline Phosphatase: 62 U/L (ref 39–117)
BILIRUBIN TOTAL: 0.4 mg/dL (ref 0.2–1.2)
BUN: 12 mg/dL (ref 6–23)
CHLORIDE: 101 meq/L (ref 96–112)
CO2: 29 meq/L (ref 19–32)
Calcium: 9.6 mg/dL (ref 8.4–10.5)
Creat: 1.01 mg/dL (ref 0.50–1.35)
GFR, Est Non African American: 79 mL/min
Glucose, Bld: 97 mg/dL (ref 70–99)
Potassium: 4.6 mEq/L (ref 3.5–5.3)
SODIUM: 138 meq/L (ref 135–145)
TOTAL PROTEIN: 6.8 g/dL (ref 6.0–8.3)

## 2013-09-05 LAB — LIPID PANEL
CHOLESTEROL: 155 mg/dL (ref 0–200)
HDL: 43 mg/dL (ref >39)
LDL CALC: 86 mg/dL (ref 0–99)
Total CHOL/HDL Ratio: 3.6 Ratio
Triglycerides: 130 mg/dL (ref <150)
VLDL: 26 mg/dL (ref 0–40)

## 2013-09-05 LAB — GLUCOSE, POCT (MANUAL RESULT ENTRY): POC GLUCOSE: 102 mg/dL — AB (ref 70–99)

## 2013-09-05 LAB — POCT GLYCOSYLATED HEMOGLOBIN (HGB A1C): Hemoglobin A1C: 10

## 2013-09-05 NOTE — Progress Notes (Signed)
I have examined this patient along with the student and agree.  He's finished his visits with diabetes/nutrition education. Advised against regular use of highly caffeinated beverages. If A1C not improved at next visit, plan the addition of Insulin.

## 2013-09-05 NOTE — Patient Instructions (Addendum)
Keep up the good work with healthier eating.  It's really, REALLY important. If we don't see continued improvement in your glucose, we'll need to start insulin.  Watch out the caffeine. I agree with the nutritionist about limiting the caffeine you drink.  Let me know if your wrist starts to be more of a problem, and I'll re-refer you to the hand specialist.

## 2013-09-05 NOTE — Progress Notes (Signed)
Subjective:    Patient ID: Rodney Walsh, male    DOB: 1951-03-20, 63 y.o.   MRN: [id number]  HPI   63y.o male with PMH of DM, hyperlipidemia, HTN, and prostate cancer presents for 4 week med check.  Last seen on 4/1 with daily sugars 350-450.  Pt counseled on lifestyle changes and started on Onglyza.  Pt has since returned to nutritionist on 4/14 who encouraged daily walking healthier eating ideas with alternatives to pt's usual snacks.  Pt states that he and his wife have been eating more fruit and vegetables.  He says he has not been very active however he works with [employer] and other outdoor equipment which will keep him active through the summer.  Pt has been checking sugar daily and review of glucometer shows readings around 112-130 with 2 of 150 in past month.  No longer is getting up to urinate throughout the night.  Denies feelings of thirst, abd pain, changes in vision.  Pt has also received shingles and pneumovax vaccines since last visit.  Pt had previously been referred to hand specialist for L wrist pain.  Pt says he still has never been called.  When asked if he would still like to see a hand specialist he states his pain is all but resolved and he only feels pain with occasional awkward movements.  He no longer wishes to see a specialist.   Review of Systems  Constitutional: Negative for fever, chills, diaphoresis and unexpected weight change.  HENT: Negative for congestion, postnasal drip, rhinorrhea, sinus pressure and sore throat.   Eyes: Negative.   Respiratory: Negative for cough, chest tightness, shortness of breath and wheezing.   Cardiovascular: Negative for chest pain and leg swelling.  Gastrointestinal: Negative.   Endocrine: Negative.   Genitourinary: Negative.   Musculoskeletal: Negative for arthralgias, gait problem and myalgias.  Skin: Negative for rash.  Allergic/Immunologic: Negative.   Neurological: Negative.   Hematological: Negative.          Objective:   Physical Exam  Constitutional: He is oriented to person, place, and time. He appears well-developed and well-nourished. No distress.  BP 129/57  Pulse 67  Temp(Src) 98 F (36.7 C) (Oral)  Resp 16  Ht 5\' 3"  (1.6 m)  Wt 284 lb (128.822 kg)  BMI 50.32 kg/m2  SpO2 95%   HENT:  Head: Normocephalic.  Mouth/Throat: Oropharynx is clear and moist. No oropharyngeal exudate.  Eyes: Conjunctivae are normal. Pupils are equal, round, and reactive to light.  Neck: Normal range of motion.  Cardiovascular: Normal rate, regular rhythm, normal heart sounds and intact distal pulses.   Pulmonary/Chest: Effort normal and breath sounds normal. No respiratory distress. He has no wheezes. He exhibits no tenderness.  Lymphadenopathy:    He has no cervical adenopathy.  Neurological: He is alert and oriented to person, place, and time.  Skin: Skin is warm and dry. No rash noted.  Psychiatric: He has a normal mood and affect. His behavior is normal.    Results for orders placed in visit on 09/05/13  GLUCOSE, POCT (MANUAL RESULT ENTRY)      Result Value Ref Range   POC Glucose 102 (*) 70 - 99 mg/dl  POCT GLYCOSYLATED HEMOGLOBIN (HGB A1C)      Result Value Ref Range   Hemoglobin A1C 10.0          Assessment & Plan:   1. Type II or unspecified type diabetes mellitus without mention of complication, uncontrolled A1C increased from 8.9  on 2/24 to 10.0 in office today.  POC glucose of 102.   - POCT glucose (manual entry) - POCT glycosylated hemoglobin (Hb A1C) - COMPLETE METABOLIC PANEL WITH GFR - Microalbumin, urine - HM Diabetes Foot Exam - HM Diabetes Eye Exam  2. HTN (hypertension) Continue with current plan - COMPLETE METABOLIC PANEL WITH GFR  3. Hyperlipidemia LDL goal <70 Continue with current plan - Lipid panel

## 2013-09-06 LAB — MICROALBUMIN, URINE: Microalb, Ur: 3.01 mg/dL — ABNORMAL HIGH (ref 0.00–1.89)

## 2013-10-03 ENCOUNTER — Encounter: Payer: BC Managed Care – PPO | Admitting: Physician Assistant

## 2013-10-03 NOTE — Progress Notes (Signed)
This encounter was created in error - please disregard.

## 2013-10-15 ENCOUNTER — Telehealth: Payer: Self-pay

## 2013-10-15 NOTE — Telephone Encounter (Signed)
Form is partially completed and signed. Please get the patients date of diabetes diagnosis, write this on number 2 page 1, and print out his medication list to include in the form.   I have left the form in Chelle's box so it will not get misplaced.

## 2013-10-15 NOTE — Telephone Encounter (Signed)
PATIENT SPOUSE CAME TO DROP OFF DISABILITY ELIGIBILITY REVIEW FOR CHELLE TO FILL OUT . [facility] AVAILABLE ALL WEEK AND PATIENT NEEDS FORM DONE BY TOMORROW June 10.  YOU CAN REACH PATIENT SPOUSE AT: [phone]

## 2013-10-15 NOTE — Telephone Encounter (Signed)
Pt says Chelle fills these out for him every month. The form is in Chelle's box. Can someone else look at this please. Thanks

## 2013-10-16 NOTE — Telephone Encounter (Signed)
Filled in date requested. Copy sent to scan Pt notified ready to be picked up.

## 2013-11-07 ENCOUNTER — Other Ambulatory Visit: Payer: Self-pay | Admitting: Physician Assistant

## 2013-11-08 ENCOUNTER — Telehealth: Payer: Self-pay

## 2013-11-08 NOTE — Telephone Encounter (Signed)
Patients wife came into [address] to drop off some forms to be completed for short term disability. Called her back on phone to pay $15 fee. She says she needs completed by Monday I told her theres no guarantee.   Please call wife when ready for pick up:   Best: [phone]   Patient came back before we closed to pay fee.

## 2013-11-09 DIAGNOSIS — Z0271 Encounter for disability determination: Secondary | ICD-10-CM

## 2013-11-11 ENCOUNTER — Other Ambulatory Visit: Payer: Self-pay

## 2013-11-11 DIAGNOSIS — E1165 Type 2 diabetes mellitus with hyperglycemia: Secondary | ICD-10-CM

## 2013-11-11 DIAGNOSIS — E785 Hyperlipidemia, unspecified: Secondary | ICD-10-CM

## 2013-11-11 DIAGNOSIS — IMO0001 Reserved for inherently not codable concepts without codable children: Secondary | ICD-10-CM

## 2013-11-11 MED ORDER — OXYBUTYNIN CHLORIDE 5 MG PO TABS: 5.0000 mg | ORAL_TABLET | Freq: Two times a day (BID) | ORAL | Status: DC

## 2013-11-11 MED ORDER — METFORMIN HCL 1000 MG PO TABS
1000.0000 mg | ORAL_TABLET | Freq: Two times a day (BID) | ORAL | Status: DC
Start: 2013-11-11 — End: 2014-10-07

## 2013-11-11 MED ORDER — LISINOPRIL 20 MG PO TABS: 20.0000 mg | ORAL_TABLET | Freq: Every day | ORAL | Status: DC

## 2013-11-15 ENCOUNTER — Other Ambulatory Visit: Payer: Self-pay | Admitting: *Deleted

## 2013-11-15 MED ORDER — LISINOPRIL 20 MG PO TABS: ORAL_TABLET | ORAL | Status: DC

## 2013-11-22 ENCOUNTER — Ambulatory Visit (INDEPENDENT_AMBULATORY_CARE_PROVIDER_SITE_OTHER): Payer: BC Managed Care – PPO | Admitting: Physician Assistant

## 2013-11-22 ENCOUNTER — Ambulatory Visit (INDEPENDENT_AMBULATORY_CARE_PROVIDER_SITE_OTHER): Payer: BC Managed Care – PPO

## 2013-11-22 VITALS — BP 132/60 | HR 69 | Temp 98.4°F | Resp 18 | Ht 63.0 in | Wt 283.0 lb

## 2013-11-22 DIAGNOSIS — E559 Vitamin D deficiency, unspecified: Secondary | ICD-10-CM

## 2013-11-22 DIAGNOSIS — M25539 Pain in unspecified wrist: Secondary | ICD-10-CM

## 2013-11-22 DIAGNOSIS — E785 Hyperlipidemia, unspecified: Secondary | ICD-10-CM

## 2013-11-22 DIAGNOSIS — M25532 Pain in left wrist: Secondary | ICD-10-CM

## 2013-11-22 DIAGNOSIS — IMO0001 Reserved for inherently not codable concepts without codable children: Secondary | ICD-10-CM

## 2013-11-22 DIAGNOSIS — I10 Essential (primary) hypertension: Secondary | ICD-10-CM

## 2013-11-22 DIAGNOSIS — M25531 Pain in right wrist: Secondary | ICD-10-CM

## 2013-11-22 DIAGNOSIS — E1165 Type 2 diabetes mellitus with hyperglycemia: Principal | ICD-10-CM

## 2013-11-22 LAB — COMPREHENSIVE METABOLIC PANEL
ALT: 33 U/L (ref 0–53)
AST: 24 U/L (ref 0–37)
Albumin: 4.2 g/dL (ref 3.5–5.2)
Alkaline Phosphatase: 54 U/L (ref 39–117)
BUN: 15 mg/dL (ref 6–23)
CHLORIDE: 102 meq/L (ref 96–112)
CO2: 25 meq/L (ref 19–32)
CREATININE: 1.02 mg/dL (ref 0.50–1.35)
Calcium: 9.4 mg/dL (ref 8.4–10.5)
GLUCOSE: 79 mg/dL (ref 70–99)
Potassium: 4.9 mEq/L (ref 3.5–5.3)
Sodium: 134 mEq/L — ABNORMAL LOW (ref 135–145)
Total Bilirubin: 0.6 mg/dL (ref 0.2–1.2)
Total Protein: 7.1 g/dL (ref 6.0–8.3)

## 2013-11-22 LAB — LIPID PANEL
CHOL/HDL RATIO: 3.8 ratio
Cholesterol: 150 mg/dL (ref 0–200)
HDL: 40 mg/dL (ref >39)
LDL Cholesterol: 81 mg/dL (ref 0–99)
TRIGLYCERIDES: 146 mg/dL (ref <150)
VLDL: 29 mg/dL (ref 0–40)

## 2013-11-22 LAB — GLUCOSE, POCT (MANUAL RESULT ENTRY): POC GLUCOSE: 78 mg/dL (ref 70–99)

## 2013-11-22 LAB — POCT GLYCOSYLATED HEMOGLOBIN (HGB A1C): Hemoglobin A1C: 6.3

## 2013-11-22 MED ORDER — IBUPROFEN 800 MG PO TABS: 800.0000 mg | ORAL_TABLET | Freq: Three times a day (TID) | ORAL | Status: DC

## 2013-11-22 NOTE — Progress Notes (Signed)
Subjective:    Patient ID: Rodney Walsh, male    DOB: March 14, 1951, 63 y.o.   MRN: [id number]   PCP: Abisai Coble, PA-C  Chief Complaint  Patient presents with  . Follow-up    diatbetes    Medications, allergies, past medical history, surgical history, family history, social history and problem list reviewed and updated.  Patient Active Problem List   Diagnosis Date Noted  . Cataract 09/25/2012  . OSA (obstructive sleep apnea) 08/10/2012  . Eczema 08/10/2012  . Vitamin D deficiency 08/10/2012  . Erectile dysfunction 08/10/2012  . Special screening for malignant neoplasms, colon 07/17/2012  . Personal history of adenomatous colonic polyps 04/08/2012  . Prostate cancer 01/04/2012  . Type II or unspecified type diabetes mellitus without mention of complication, uncontrolled 10/25/2011  . HTN (hypertension) 10/25/2011  . Hyperlipidemia LDL goal <70 10/25/2011  . Morbid obesity 10/25/2011    Prior to Admission medications   Medication Sig Start Date End Date Taking? Authorizing Provider  aspirin 81 MG tablet Take 81 mg by mouth daily.   Yes Historical Provider, MD  clobetasol cream (TEMOVATE) 6.29 % Apply 1 application topically 2 (two) times daily as needed. 07/02/13  Yes Ainara Eldridge S Kima Malenfant, PA-C  furosemide (LASIX) 40 MG tablet Take 1 tablet (40 mg total) by mouth daily as needed for edema. 11/06/12  Yes Caretha Rumbaugh S Tal Neer, PA-C  lisinopril (PRINIVIL,ZESTRIL) 20 MG tablet Take one tablet daily PT NEEDS OFFICE VISIT FOR FURTHER REFILLS 11/15/13  Yes Eleanore Kurtis Bushman, PA-C  metFORMIN (GLUCOPHAGE) 1000 MG tablet Take 1 tablet (1,000 mg total) by mouth 2 (two) times daily with a meal. 11/11/13  Yes Stanislav Gervase S Anaaya Fuster, PA-C  oxybutynin (DITROPAN) 5 MG tablet Take 1 tablet (5 mg total) by mouth 2 (two) times daily. 11/11/13  Yes Vilda Zollner S Reshard Guillet, PA-C  potassium chloride SA (KLOR-CON M20) 20 MEQ tablet Take 1 tablet (20 mEq total) by mouth daily as needed (if take furosemide (Lasix)). 11/06/12  Yes  Lizzy Hamre S Tyreek Clabo, PA-C  saxagliptin HCl (ONGLYZA) 5 MG TABS tablet Take 1 tablet (5 mg total) by mouth daily. PATIENT NEEDS OFFICE VISIT FOR ADDITIONAL REFILLS   Yes Kamariya Blevens S Tarez Bowns, PA-C  traZODone (DESYREL) 100 MG tablet Take 1 tablet (100 mg total) by mouth at bedtime as needed for sleep. 07/08/13  Yes Greta Yung S Tianna Baus, PA-C  meloxicam (MOBIC) 15 MG tablet Take 15 mg by mouth daily.    Historical Provider, MD    HPI  Needs to follow up on DM, but also complains of continued pain in both hands. Initially seen for pain in the LEFT wrist/thumb in 03/2013.  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 Reports "cracking" sounds when he moves his hands, and sharp pains preventing full ROM of the hands. We've tried to get him in with a hand specialist x 2, but the first time he never got a call to schedule an appointment, and the second time, he decided that he couldn't afford the $81 specialist co-pay.  Thumb spica splint was initially beneficial, so much so that he bought one for the RIGHT wrist as well, but then became ineffective.  He now finds the best relief with ace wraps.  He's had no benefit from Aleve, Ibuprofen, meloxicam, and multiple OTC creams/"salves."  Frequency of home glucose monitoring: inconsistent though readings over the past two weeks range from 98-149. Sees a dentist infrequently (has been multiple years), eye specialist annually (05/2013).  Checks feet daily.  Is current with influenza vaccine.  Is current with pneumovax-23 vaccine.   Review of Systems As above. Denies chest pain, shortness of breath, HA, dizziness, vision change, nausea, vomiting, diarrhea, constipation, melena, hematochezia, dysuria, increased urinary urgency or frequency, increased hunger or thirst, unintentional weight change.     Objective:   Physical Exam  Vitals reviewed. Constitutional: He is oriented to person, place, and time. Vital signs are normal. He appears  well-developed and well-nourished. He is active and cooperative. No distress.  BP 132/60  Pulse 69  Temp(Src) 98.4 F (36.9 C) (Oral)  Resp 18  Ht 5\' 3"  (1.6 m)  Wt 283 lb (128.368 kg)  BMI 50.14 kg/m2  SpO2 96%  HENT:  Head: Normocephalic and atraumatic.  Right Ear: Hearing normal.  Left Ear: Hearing normal.  Eyes: Conjunctivae are normal. No scleral icterus.  Neck: Normal range of motion. Neck supple. No thyromegaly present.  Cardiovascular: Normal rate, regular rhythm and normal heart sounds.   Pulses:      Radial pulses are 2+ on the right side, and 2+ on the left side.  Pulmonary/Chest: Effort normal and breath sounds normal.  Musculoskeletal:       Right elbow: Normal.      Left elbow: Normal.       Right wrist: Normal.       Left wrist: He exhibits normal range of motion, no tenderness, no bony tenderness and no swelling.       Right forearm: Normal.       Left forearm: Normal.       Arms:      Right hand: Normal. Normal sensation noted. Normal strength noted.       Left hand: He exhibits tenderness (along the extensor tendon of the thumb). He exhibits normal range of motion, no bony tenderness, normal two-point discrimination, normal capillary refill, no deformity, no laceration and no swelling. Normal sensation noted. Normal strength noted.  Lymphadenopathy:       Head (right side): No tonsillar, no preauricular, no posterior auricular and no occipital adenopathy present.       Head (left side): No tonsillar, no preauricular, no posterior auricular and no occipital adenopathy present.    He has no cervical adenopathy.       Right: No supraclavicular adenopathy present.       Left: No supraclavicular adenopathy present.  Neurological: He is alert and oriented to person, place, and time. No sensory deficit.  Skin: Skin is warm, dry and intact. No rash noted. No cyanosis or erythema. Nails show no clubbing.  Psychiatric: He has a normal mood and affect.   See diabetic  foot exam.   Results for orders placed in visit on 11/22/13  GLUCOSE, POCT (MANUAL RESULT ENTRY)      Result Value Ref Range   POC Glucose 78  70 - 99 mg/dl  POCT GLYCOSYLATED HEMOGLOBIN (HGB A1C)      Result Value Ref Range   Hemoglobin A1C 6.3     BILATERAL WRISTS: [facility] reading (PRIMARY) by  Dr. Linna Darner.  Evidence of old LEFT wrist fracture, no acute findings.      Assessment & Plan:  1. Type II or unspecified type diabetes mellitus without mention of complication, controlled This is the most well controlled his glucose has been in quite some time.  Congratulated on his efforts, encouraged to continue. - POCT glucose (manual entry) - POCT glycosylated hemoglobin (Hb A1C) - Comprehensive metabolic panel - Microalbumin, urine  2. Essential hypertension Controlled.  Continue current treatment.  3. Hyperlipidemia LDL goal <70 He remains OFF a statin due to perceived myalgias associated with statin use.  Await lab results. - Lipid panel  4. Morbid obesity Continue efforts for healthy eating and regular exercise.  5. Pain in both wrists He is not interested in seeing a specialist at this point.  Believes that he'll improve with 800 mg ibuprofen and ace wraps (he's using an old pair of ace wraps, securing them with rubber bands), so I've provided him with new ones with velcro closure. - DG Wrist Complete Left; Future - DG Wrist Complete Right; Future - ibuprofen (ADVIL,MOTRIN) 800 MG tablet; Take 1 tablet (800 mg total) by mouth 3 (three) times daily. Take with food.  Dispense: 90 tablet; Refill: 1  6. Vitamin D deficiency Previously on prescription supplement, but not currently taking any thing. - Vit D  25 hydroxy (rtn osteoporosis monitoring)   Fara Chute, PA-C Physician Assistant-Certified [facility]

## 2013-11-22 NOTE — Patient Instructions (Signed)
Keep up the GREAT work! 

## 2013-11-23 LAB — MICROALBUMIN, URINE: Microalb, Ur: 3.5 mg/dL — ABNORMAL HIGH (ref 0.00–1.89)

## 2013-11-23 LAB — VITAMIN D 25 HYDROXY (VIT D DEFICIENCY, FRACTURES): Vit D, 25-Hydroxy: 36 ng/mL (ref 30–89)

## 2013-11-26 ENCOUNTER — Ambulatory Visit: Payer: BC Managed Care – PPO | Admitting: Physician Assistant

## 2013-12-06 ENCOUNTER — Other Ambulatory Visit: Payer: Self-pay | Admitting: Physician Assistant

## 2013-12-11 DIAGNOSIS — Z0271 Encounter for disability determination: Secondary | ICD-10-CM

## 2013-12-26 ENCOUNTER — Telehealth: Payer: Self-pay | Admitting: Physician Assistant

## 2013-12-26 NOTE — Telephone Encounter (Signed)
Letter from [place] requesting: 1. Hgb A1C, blood sugars for the past 2 months. 2. Letter from doctor on letterhead stating "how does his disease keep him from doing his job?"  I have written the letter, that includes his most recent glucose and A1C here.  He will need to provide his home glucose readings for the past 2 months.  He can access the letter in My Chart, or we can print it for him.

## 2013-12-26 NOTE — Telephone Encounter (Signed)
Advised pt letter available in My Chart. Pt states if he has any trouble accessing it he will give [place] a call back.

## 2014-01-27 ENCOUNTER — Telehealth: Payer: Self-pay

## 2014-01-27 NOTE — Telephone Encounter (Signed)
Please call this patient, who goes by "Rodney Walsh."  How often is he actually taking the Lasix (furosemide)?  His medication list has it PRN for lower extremity edema.  Does he really take it every day?  We need good people like him to serve on juries, and I'd like him to do it if he can.  Even though it's a hassle, he's a perfect candidate-he's not working.

## 2014-01-27 NOTE — Telephone Encounter (Signed)
Patient of [employer]. He needs a letter faxed to the [employer] stating the medication he takes causes him to go to the restroom every 30 minutes and it unable to sit for jury duty. Fax number is [phone]. Please call patient back at [phone].

## 2014-01-28 NOTE — Telephone Encounter (Signed)
Pt agrees to attend the jury selection portion of the duty.

## 2014-01-28 NOTE — Telephone Encounter (Signed)
Pt called in and said that he is taking the medication every morning, and states, " I would not be a good candidate because I have to use the restroom every 30 minutes."  Pt also would like Chelle to know he will be coming in to see her next month for an appt. Please advise pt.

## 2014-01-28 NOTE — Telephone Encounter (Signed)
Pt called back about his jury duty that its for the [facility], for a total time of six weeks and its located in [place].

## 2014-02-12 ENCOUNTER — Ambulatory Visit (INDEPENDENT_AMBULATORY_CARE_PROVIDER_SITE_OTHER): Payer: BC Managed Care – PPO | Admitting: Physician Assistant

## 2014-02-12 VITALS — BP 148/74 | HR 85 | Temp 100.1°F | Resp 16 | Ht 63.0 in | Wt 295.0 lb

## 2014-02-12 DIAGNOSIS — E785 Hyperlipidemia, unspecified: Secondary | ICD-10-CM

## 2014-02-12 DIAGNOSIS — J069 Acute upper respiratory infection, unspecified: Secondary | ICD-10-CM

## 2014-02-12 DIAGNOSIS — L309 Dermatitis, unspecified: Secondary | ICD-10-CM

## 2014-02-12 DIAGNOSIS — E118 Type 2 diabetes mellitus with unspecified complications: Secondary | ICD-10-CM

## 2014-02-12 DIAGNOSIS — J3489 Other specified disorders of nose and nasal sinuses: Secondary | ICD-10-CM

## 2014-02-12 LAB — POCT GLYCOSYLATED HEMOGLOBIN (HGB A1C): Hemoglobin A1C: 5.9

## 2014-02-12 LAB — GLUCOSE, POCT (MANUAL RESULT ENTRY): POC Glucose: 95 mg/dl (ref 70–99)

## 2014-02-12 MED ORDER — MUCINEX DM MAXIMUM STRENGTH 60-1200 MG PO TB12: 1.0000 | ORAL_TABLET | Freq: Two times a day (BID) | ORAL | Status: DC

## 2014-02-12 MED ORDER — CLOBETASOL PROPIONATE 0.05 % EX CREA: 1.0000 "application " | TOPICAL_CREAM | Freq: Two times a day (BID) | CUTANEOUS | Status: DC | PRN

## 2014-02-12 MED ORDER — IPRATROPIUM BROMIDE 0.03 % NA SOLN: 2.0000 | Freq: Two times a day (BID) | NASAL | Status: DC

## 2014-02-12 NOTE — Progress Notes (Signed)
I have examined this patient along with Ms. English, PA-C and agree.

## 2014-02-12 NOTE — Patient Instructions (Signed)
Great a1C.  Keep up the good work with observance to your diet and consider increasing your exercise. If your symptoms don't resolve within 8 additional days and/or your fever gets worse, please return to the clinic.

## 2014-02-12 NOTE — Progress Notes (Signed)
Subjective:    Patient ID: Rodney Walsh, male    DOB: 01-22-1951, 63 y.o.   MRN: [id number]  Illness Associated symptoms include congestion and rhinorrhea. Pertinent negatives include no ear pain, eye pain, headaches, chest pain, coughing, shortness of breath, abdominal pain, diarrhea or nausea. Sore throat: scratchy throat.   Rodney Walsh is a 63 year old male with a history of diabetes, hyperlipidemia, HTN, and prostate Cancer, here today for his 3 month follow up.  He has no concerns to report regarding his chronic illnesses.  Today, he reports a 2 day symptoms of sneezing, chills, rhinorrhea, and watery eyes.  He had a scratchy throat yesterday but it has since resolved.  He has hot tea with lemon and honey for relief, but has not taken any medications.    In regards to today's follow up... DM:  he has been maintaining a morning fasting blood glucose between 111-125, with one day of a 140 which he reports was due to bad eating the night before.  He reports staying away from foods with high cholesterol and increasing his fiber intake.  He is drinking more water.       His exercise is 1/week bicycling for 10 minutes.  He does not do anymore due to time constraints.  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   He denies feeling tremulous, nausea, visual changes (unless his glasses get smudgy), lower extremity numbness, or tingling.  He also denies chest pains or sob.    HTN: He denies headaches, palpitations, visual changes.  He had one day of swelling between the time of his last visit, which he used his diuretic.  This resolved the swelling.    OSA: Sleeps 2-3 hrs/day.  He does not use the c-pap machine because he feels dry mouth with use.  He denies any fatigue.     Requests a refill on exzema medication.    Review of Systems  Constitutional: Positive for chills.  HENT: Positive for congestion and rhinorrhea. Negative for ear pain. Sore throat:  scratchy throat.   Eyes: Negative for pain and visual disturbance.  Respiratory: Negative for cough and shortness of breath.   Cardiovascular: Negative for chest pain and palpitations.  Gastrointestinal: Negative for nausea, abdominal pain and diarrhea.  Genitourinary: Positive for frequency.  Neurological: Negative for dizziness, numbness (No lower extremity numbness.) and headaches.       Objective:   Physical Exam  Constitutional: He is oriented to person, place, and time. He appears well-developed and well-nourished.  HENT:  Head: Normocephalic and atraumatic.  Nose: Mucosal edema (mild right nasal mucosal edema ) and rhinorrhea present.  Mouth/Throat: Mucous membranes are normal. No posterior oropharyngeal erythema.  Eyes: Conjunctivae are normal. Pupils are equal, round, and reactive to light.  Cardiovascular: Normal rate, regular rhythm, normal heart sounds and intact distal pulses.  Exam reveals no gallop and no friction rub.   No murmur heard. Pulmonary/Chest: Effort normal and breath sounds normal. No respiratory distress. He has no wheezes.  Neurological: He is alert and oriented to person, place, and time.  Skin: Skin is warm and dry.  Diabetic Foot Exam: Normal sensation throughout  BP 148/74  Pulse 85  Temp(Src) 100.1 F (37.8 C)  Resp 16  Ht 5\' 3"  (1.6 m)  Wt 295 lb (133.811 kg)  BMI 52.27 kg/m2  SpO2 97%  Results for orders placed in visit on 02/12/14  POCT GLYCOSYLATED HEMOGLOBIN (HGB  A1C)      Result Value Ref Range   Hemoglobin A1C 5.9    GLUCOSE, POCT (MANUAL RESULT ENTRY)      Result Value Ref Range   POC Glucose 95  70 - 99 mg/dl        .this     Assessment & Plan:  Rodney Walsh is a 63 year old male with a history of diabetes, hyperlipidemia, HTN, and prostate cancer. Type 2 diabetes mellitus with complication - Plan: POCT glycosylated hemoglobin (Hb A1C), POCT glucose (manual entry), COMPLETE METABOLIC PANEL WITH GFR  Hyperlipidemia - Plan: Lipid  panel  Viral URI - Plan: ipratropium (ATROVENT) 0.03 % nasal spray, DISCONTINUED: ipratropium (ATROVENT) 0.03 % nasal spray  Rhinorrhea - Plan: Dextromethorphan-Guaifenesin (MUCINEX DM MAXIMUM STRENGTH) 60-1200 MG TB12, DISCONTINUED: Dextromethorphan-Guaifenesin (MUCINEX DM MAXIMUM STRENGTH) 60-1200 MG TB12  Eczema - Plan: clobetasol cream (TEMOVATE) 0.05 %  Ivar Drape, PA-C [facility] 10/7/20156:55 PM

## 2014-02-13 ENCOUNTER — Telehealth: Payer: Self-pay

## 2014-02-13 LAB — COMPLETE METABOLIC PANEL WITH GFR
ALT: 24 U/L (ref 0–53)
AST: 22 U/L (ref 0–37)
Albumin: 4.5 g/dL (ref 3.5–5.2)
Alkaline Phosphatase: 60 U/L (ref 39–117)
BUN: 15 mg/dL (ref 6–23)
CALCIUM: 9.7 mg/dL (ref 8.4–10.5)
CHLORIDE: 104 meq/L (ref 96–112)
CO2: 26 mEq/L (ref 19–32)
Creat: 1.41 mg/dL — ABNORMAL HIGH (ref 0.50–1.35)
GFR, EST NON AFRICAN AMERICAN: 53 mL/min — AB
GFR, Est African American: 61 mL/min
Glucose, Bld: 95 mg/dL (ref 70–99)
Potassium: 5 mEq/L (ref 3.5–5.3)
Sodium: 140 mEq/L (ref 135–145)
Total Bilirubin: 0.4 mg/dL (ref 0.2–1.2)
Total Protein: 7.5 g/dL (ref 6.0–8.3)

## 2014-02-13 LAB — LIPID PANEL
CHOL/HDL RATIO: 3.6 ratio
CHOLESTEROL: 153 mg/dL (ref 0–200)
HDL: 42 mg/dL (ref >39)
LDL Cholesterol: 81 mg/dL (ref 0–99)
TRIGLYCERIDES: 149 mg/dL (ref <150)
VLDL: 30 mg/dL (ref 0–40)

## 2014-02-13 NOTE — Telephone Encounter (Signed)
Patient is calling to provide fax number for the court system.  Please give to the PA who was working with [employer] yesterday.  Fax  [phone]  Patient would like a call back when the information has been faxed.  [phone]

## 2014-02-21 ENCOUNTER — Encounter: Payer: Self-pay | Admitting: Physician Assistant

## 2014-06-10 ENCOUNTER — Ambulatory Visit (INDEPENDENT_AMBULATORY_CARE_PROVIDER_SITE_OTHER): Payer: BC Managed Care – PPO | Admitting: Physician Assistant

## 2014-06-10 ENCOUNTER — Encounter: Payer: Self-pay | Admitting: Physician Assistant

## 2014-06-10 VITALS — BP 155/81 | HR 61 | Temp 98.7°F | Resp 18 | Ht 64.0 in | Wt 295.8 lb

## 2014-06-10 DIAGNOSIS — R351 Nocturia: Secondary | ICD-10-CM

## 2014-06-10 DIAGNOSIS — C61 Malignant neoplasm of prostate: Secondary | ICD-10-CM

## 2014-06-10 DIAGNOSIS — E559 Vitamin D deficiency, unspecified: Secondary | ICD-10-CM

## 2014-06-10 DIAGNOSIS — L219 Seborrheic dermatitis, unspecified: Secondary | ICD-10-CM

## 2014-06-10 DIAGNOSIS — L309 Dermatitis, unspecified: Secondary | ICD-10-CM

## 2014-06-10 DIAGNOSIS — Z23 Encounter for immunization: Secondary | ICD-10-CM

## 2014-06-10 DIAGNOSIS — E1165 Type 2 diabetes mellitus with hyperglycemia: Secondary | ICD-10-CM

## 2014-06-10 DIAGNOSIS — I10 Essential (primary) hypertension: Secondary | ICD-10-CM

## 2014-06-10 DIAGNOSIS — E785 Hyperlipidemia, unspecified: Secondary | ICD-10-CM

## 2014-06-10 DIAGNOSIS — G4733 Obstructive sleep apnea (adult) (pediatric): Secondary | ICD-10-CM

## 2014-06-10 DIAGNOSIS — E119 Type 2 diabetes mellitus without complications: Secondary | ICD-10-CM

## 2014-06-10 DIAGNOSIS — M25512 Pain in left shoulder: Secondary | ICD-10-CM

## 2014-06-10 LAB — CBC WITH DIFFERENTIAL/PLATELET
BASOS PCT: 1 % (ref 0–1)
Basophils Absolute: 0 10*3/uL (ref 0.0–0.1)
Eosinophils Absolute: 0.1 10*3/uL (ref 0.0–0.7)
Eosinophils Relative: 2 % (ref 0–5)
HEMATOCRIT: 43.7 % (ref 39.0–52.0)
Hemoglobin: 14.9 g/dL (ref 13.0–17.0)
LYMPHS PCT: 34 % (ref 12–46)
Lymphs Abs: 1.3 10*3/uL (ref 0.7–4.0)
MCH: 30.2 pg (ref 26.0–34.0)
MCHC: 34.1 g/dL (ref 30.0–36.0)
MCV: 88.5 fL (ref 78.0–100.0)
MONOS PCT: 11 % (ref 3–12)
MPV: 10.1 fL (ref 8.6–12.4)
Monocytes Absolute: 0.4 10*3/uL (ref 0.1–1.0)
NEUTROS PCT: 52 % (ref 43–77)
Neutro Abs: 2 10*3/uL (ref 1.7–7.7)
PLATELETS: 222 10*3/uL (ref 150–400)
RBC: 4.94 MIL/uL (ref 4.22–5.81)
RDW: 12.6 % (ref 11.5–15.5)
WBC: 3.9 10*3/uL — ABNORMAL LOW (ref 4.0–10.5)

## 2014-06-10 LAB — COMPREHENSIVE METABOLIC PANEL
ALBUMIN: 4 g/dL (ref 3.5–5.2)
ALK PHOS: 52 U/L (ref 39–117)
ALT: 25 U/L (ref 0–53)
AST: 21 U/L (ref 0–37)
BUN: 12 mg/dL (ref 6–23)
CALCIUM: 9.2 mg/dL (ref 8.4–10.5)
CO2: 26 meq/L (ref 19–32)
Chloride: 103 mEq/L (ref 96–112)
Creat: 1.02 mg/dL (ref 0.50–1.35)
GLUCOSE: 132 mg/dL — AB (ref 70–99)
POTASSIUM: 4.7 meq/L (ref 3.5–5.3)
Sodium: 138 mEq/L (ref 135–145)
Total Bilirubin: 0.4 mg/dL (ref 0.2–1.2)
Total Protein: 7 g/dL (ref 6.0–8.3)

## 2014-06-10 LAB — GLUCOSE, POCT (MANUAL RESULT ENTRY): POC GLUCOSE: 152 mg/dL — AB (ref 70–99)

## 2014-06-10 LAB — LIPID PANEL
CHOL/HDL RATIO: 3.6 ratio
Cholesterol: 148 mg/dL (ref 0–200)
HDL: 41 mg/dL (ref >39)
LDL CALC: 91 mg/dL (ref 0–99)
Triglycerides: 81 mg/dL (ref <150)
VLDL: 16 mg/dL (ref 0–40)

## 2014-06-10 LAB — POCT GLYCOSYLATED HEMOGLOBIN (HGB A1C): Hemoglobin A1C: 6.3

## 2014-06-10 LAB — TSH: TSH: 1.074 u[IU]/mL (ref 0.350–4.500)

## 2014-06-10 MED ORDER — OXYBUTYNIN CHLORIDE 5 MG PO TABS: 5.0000 mg | ORAL_TABLET | Freq: Two times a day (BID) | ORAL | Status: DC

## 2014-06-10 MED ORDER — KETOCONAZOLE 2 % EX CREA: 1.0000 "application " | TOPICAL_CREAM | Freq: Every day | CUTANEOUS | Status: DC

## 2014-06-10 NOTE — Progress Notes (Signed)
Subjective:    Patient ID: Rodney Walsh, male    DOB: 08-11-1950, 64 y.o.   MRN: [id number]   PCP: Johan Antonacci, PA-C  Chief Complaint  Patient presents with  . Diabetes  . Shoulder Pain    left shoulder pain  . Rash    both legs    No Known Allergies  Patient Active Problem List   Diagnosis Date Noted  . Nocturia 06/10/2014  . Cataract 09/25/2012  . OSA (obstructive sleep apnea) 08/10/2012  . Eczema 08/10/2012  . Vitamin D deficiency 08/10/2012  . Erectile dysfunction 08/10/2012  . Special screening for malignant neoplasms, colon 07/17/2012  . Personal history of adenomatous colonic polyps 04/08/2012  . Prostate cancer 01/04/2012  . Diabetes type 2, uncontrolled 10/25/2011  . HTN (hypertension) 10/25/2011  . Hyperlipidemia LDL goal <70 10/25/2011  . Morbid obesity 10/25/2011    Prior to Admission medications   Medication Sig Start Date End Date Taking? Authorizing Provider  aspirin 81 MG tablet Take 81 mg by mouth daily.   Yes Historical Provider, MD  oxybutynin (DITROPAN) 5 MG tablet Take 1 tablet (5 mg total) by mouth 2 (two) times daily. 06/10/14  Yes Serge Main S Sabrea Sankey, PA-C  clobetasol cream (TEMOVATE) 8.56 % Apply 1 application topically 2 (two) times daily as needed. Patient not taking: Reported on 06/10/2014 02/12/14   Dorian Heckle English, PA  furosemide (LASIX) 40 MG tablet Take 1 tablet (40 mg total) by mouth daily as needed for edema. Patient not taking: Reported on 06/10/2014 11/06/12   Macari Zalesky S Sherilee Smotherman, PA-C  ibuprofen (ADVIL,MOTRIN) 800 MG tablet Take 1 tablet (800 mg total) by mouth 3 (three) times daily. Take with food. Patient not taking: Reported on 06/10/2014 11/22/13   Kavi Almquist S Thomasene Dubow, PA-C  ipratropium (ATROVENT) 0.03 % nasal spray Place 2 sprays into both nostrils 2 (two) times daily. Patient not taking: Reported on 06/10/2014 02/12/14   Dorian Heckle English, PA  ketoconazole (NIZORAL) 2 % cream Apply 1 application topically daily. 06/10/14   Javyn Havlin Janalee Dane, PA-C  lisinopril (PRINIVIL,ZESTRIL) 20 MG tablet Take one tablet daily PT NEEDS OFFICE VISIT FOR FURTHER REFILLS Patient not taking: Reported on 06/10/2014 11/15/13   Theda Sers, PA-C  metFORMIN (GLUCOPHAGE) 1000 MG tablet Take 1 tablet (1,000 mg total) by mouth 2 (two) times daily with a meal. Patient not taking: Reported on 06/10/2014 11/11/13   Shaeleigh Graw S Audrick Lamoureaux, PA-C  potassium chloride SA (KLOR-CON M20) 20 MEQ tablet Take 1 tablet (20 mEq total) by mouth daily as needed (if take furosemide (Lasix)). Patient not taking: Reported on 06/10/2014 11/06/12   Damel Querry S Johnice Riebe, PA-C  traZODone (DESYREL) 100 MG tablet Take 1 tablet (100 mg total) by mouth at bedtime as needed for sleep. Patient not taking: Reported on 06/10/2014 07/08/13   Fara Chute, PA-C    Medical, Surgical, Family and Social History reviewed and updated.  HPI Rodney Walsh presents for routine follow-up on controlled diabetes.  In addition, he complains of LEFT shoulder pain and rash on both legs.  "I haven't been taking my medicine, but I've been checking my sugar every day." "We were on a fast at church, and I saw that commercial about Onglyza causing cancer." Glucose readings daily, most 110's-130's, occasionally 140's, 2 readings above 200. None in 4 weeks.  "Terrible" shoulder pain. No specific trauma or injury. Not interested in xrays today. Not preventing him from engine work or ADLs. Hands with some pain and "crunch," but don't swell. "Mostly  when I'm holding a steering wheel."  Rash on both legs. Itchy.  Also on the faceace, on either side of nose. Lotion helps both, but it recurs, and he's very inconsistent with using it.  Constipated. Using Miralax. Eliminating meat for fast helped a lot. "I'm eating a lot of fruits and vegetables."   Review of Systems As above. No CP, SOB, HA, dizziness, N/V, diarrhea.    Objective:   Physical Exam  Constitutional: He is oriented to person, place, and time. Vital signs are  normal. He appears well-developed and well-nourished. He is active and cooperative. No distress.  BP 155/81 mmHg  Pulse 61  Temp(Src) 98.7 F (37.1 C) (Oral)  Resp 18  Ht 5\' 4"  (1.626 m)  Wt 295 lb 12.8 oz (134.174 kg)  BMI 50.75 kg/m2  SpO2 97%  HENT:  Head: Normocephalic and atraumatic.  Right Ear: Hearing normal.  Left Ear: Hearing normal.  Eyes: Conjunctivae are normal. No scleral icterus.  Neck: Normal range of motion. Neck supple. No thyromegaly present.  Cardiovascular: Normal rate, regular rhythm and normal heart sounds.   Pulses:      Radial pulses are 2+ on the right side, and 2+ on the left side.  Pulmonary/Chest: Effort normal and breath sounds normal.  Musculoskeletal:       Right shoulder: Normal.       Left shoulder: He exhibits tenderness and pain. He exhibits no bony tenderness and no swelling.  Lymphadenopathy:       Head (right side): No tonsillar, no preauricular, no posterior auricular and no occipital adenopathy present.       Head (left side): No tonsillar, no preauricular, no posterior auricular and no occipital adenopathy present.    He has no cervical adenopathy.       Right: No supraclavicular adenopathy present.       Left: No supraclavicular adenopathy present.  Neurological: He is alert and oriented to person, place, and time. No sensory deficit.  Skin: Skin is warm, dry and intact. Rash (plaques on both lower legs of hyperpigmentation. ) noted. No cyanosis or erythema. Nails show no clubbing.  Very dry skin in general.  Psychiatric: He has a normal mood and affect.     Results for orders placed or performed in visit on 06/10/14  POCT glucose (manual entry)  Result Value Ref Range   POC Glucose 152 (A) 70 - 99 mg/dl  POCT glycosylated hemoglobin (Hb A1C)  Result Value Ref Range   Hemoglobin A1C 6.3         Assessment & Plan:  1. Diabetes type 2, controlled Controlled, off medications. Will need to use caution as he finishes the fast and  adds food back. Continue to monitor. Re-evaluate in 3 months. - HM Diabetes Foot Exam - POCT glucose (manual entry) - POCT glycosylated hemoglobin (Hb A1C)  2. Essential hypertension Above goal today. Not interested in restarting Lasix or lisinopril. Needs to lose weight. - CBC with Differential/Platelet - Comprehensive metabolic panel - TSH  3. Hyperlipidemia LDL goal <70 Await lab results. Expect that with recent fasting he'll be ok. - Lipid panel  4. OSA (obstructive sleep apnea) Continue use of CPAP.  5. Morbid obesity Counseled on the need for healthier eating and regular exercise for weight loss. He may be able to eliminate DM, HTN and OSA with weight loss.  6. Eczema Chronic. On the hands, well controlled with clobetasol cream. He may use this on the lesions on his legs, along with daily  moisturizer.  7. Nocturia Stable/controlled. Continue oxybutinin. - oxybutynin (DITROPAN) 5 MG tablet; Take 1 tablet (5 mg total) by mouth 2 (two) times daily.  Dispense: 180 tablet; Refill: 3  8. Vitamin D deficiency Await lab results. May need to resume supplementation. - Vitamin D 1,25 dihydroxy  9. Need for influenza vaccination - Flu Vaccine QUAD 36+ mos IM  10. Prostate cancer S/P treatment. Seed implantation. Hasn't followed up with urology, so update PSA today. - PSA  11. Seborrheic dermatitis Chronic, previously treated with moisturizer. Trial of ketoconazole cream. - ketoconazole (NIZORAL) 2 % cream; Apply 1 application topically daily.  Dispense: 30 g; Refill: 0  12. Shoulder Pain, LEFT He's not interested in evaluation for this today and declines referral to ortho. Advise OTC NSAIDS. Let me know if he decides he'd like to address it further.  Return in about 3 months (around 09/08/2014), or if symptoms worsen or fail to improve.   Fara Chute, PA-C Physician Assistant-Certified [facility]

## 2014-06-10 NOTE — Patient Instructions (Signed)
I will contact you with your lab results as soon as they are available.   If you have not heard from me in 2 weeks, please contact me.  The fastest way to get your results is to register for My Chart (see the instructions on the last page of this printout).  Exercise. Eat Healthy. Be a good partner.

## 2014-06-11 LAB — PSA: PSA: 0.05 ng/mL (ref <=4.00)

## 2014-06-12 LAB — VITAMIN D 1,25 DIHYDROXY
Vitamin D 1, 25 (OH)2 Total: 48 pg/mL (ref 18–72)
Vitamin D2 1, 25 (OH)2: <8 pg/mL
Vitamin D3 1, 25 (OH)2: 48 pg/mL

## 2014-06-24 ENCOUNTER — Encounter: Payer: Self-pay | Admitting: Physician Assistant

## 2014-07-11 ENCOUNTER — Telehealth: Payer: Self-pay | Admitting: Physician Assistant

## 2014-07-11 ENCOUNTER — Encounter: Payer: Self-pay | Admitting: Physician Assistant

## 2014-07-11 NOTE — Telephone Encounter (Signed)
Received forms from patient for completion:Medical Report for Disability Re-Examination and DMV.  Completed. Placed in the Nurses box. Need copies for scanning.

## 2014-09-16 ENCOUNTER — Ambulatory Visit: Payer: BC Managed Care – PPO | Admitting: Physician Assistant

## 2014-10-07 ENCOUNTER — Encounter: Payer: Self-pay | Admitting: Physician Assistant

## 2014-10-07 ENCOUNTER — Ambulatory Visit (INDEPENDENT_AMBULATORY_CARE_PROVIDER_SITE_OTHER): Payer: BC Managed Care – PPO | Admitting: Physician Assistant

## 2014-10-07 VITALS — BP 163/100 | HR 91 | Temp 98.6°F | Resp 16 | Ht 64.0 in | Wt 258.0 lb

## 2014-10-07 DIAGNOSIS — E785 Hyperlipidemia, unspecified: Secondary | ICD-10-CM | POA: Diagnosis not present

## 2014-10-07 DIAGNOSIS — R358 Other polyuria: Secondary | ICD-10-CM | POA: Diagnosis not present

## 2014-10-07 DIAGNOSIS — N492 Inflammatory disorders of scrotum: Secondary | ICD-10-CM | POA: Diagnosis not present

## 2014-10-07 DIAGNOSIS — R631 Polydipsia: Secondary | ICD-10-CM | POA: Diagnosis not present

## 2014-10-07 DIAGNOSIS — IMO0002 Reserved for concepts with insufficient information to code with codable children: Secondary | ICD-10-CM

## 2014-10-07 DIAGNOSIS — R351 Nocturia: Secondary | ICD-10-CM | POA: Diagnosis not present

## 2014-10-07 DIAGNOSIS — E1165 Type 2 diabetes mellitus with hyperglycemia: Secondary | ICD-10-CM | POA: Diagnosis not present

## 2014-10-07 DIAGNOSIS — R3589 Other polyuria: Secondary | ICD-10-CM

## 2014-10-07 LAB — GLUCOSE, POCT (MANUAL RESULT ENTRY): POC Glucose: 400 mg/dl — AB (ref 70–99)

## 2014-10-07 LAB — POCT GLYCOSYLATED HEMOGLOBIN (HGB A1C): Hemoglobin A1C: 14

## 2014-10-07 MED ORDER — DOXYCYCLINE HYCLATE 100 MG PO CAPS: 100.0000 mg | ORAL_CAPSULE | Freq: Two times a day (BID) | ORAL | Status: AC

## 2014-10-07 MED ORDER — METFORMIN HCL 1000 MG PO TABS: 1000.0000 mg | ORAL_TABLET | Freq: Two times a day (BID) | ORAL | Status: DC

## 2014-10-07 NOTE — Progress Notes (Signed)
Patient ID: Rodney Walsh, male    DOB: November 08, 1950, 64 y.o.   MRN: [id number]  PCP: Wynne Dust  Subjective:   Chief Complaint  Patient presents with  . Cyst    Left testicle x 1 wk    HPI Presents for evaluation of a cyst on the LEFT scrotum x 1 week.  Initially it was swollen and red, then developed a "head" and he tried to squeeze it.  It ultimately drained spontaneously, and is no longer tender, though he feels a "lump" there still.  No fever, chills. No nausea or vomiting. No swollen lymph nodes.  He notes nocturia (every couple of hours), daytime polyuria and polydipsia and increased thirst.  He's very pleased with significant weight loss.  Recall that last year he stopped all his medications and stopped using CPAP. He maintained good glucose control, and in 06/2014 his A1C was 6.3%.   Review of Systems As above. Otherwise negative.    Patient Active Problem List   Diagnosis Date Noted  . Nocturia 06/10/2014  . Cataract 09/25/2012  . OSA (obstructive sleep apnea) 08/10/2012  . Eczema 08/10/2012  . Vitamin D deficiency 08/10/2012  . Erectile dysfunction 08/10/2012  . Special screening for malignant neoplasms, colon 07/17/2012  . Personal history of adenomatous colonic polyps 04/08/2012  . Prostate cancer 01/04/2012  . Diabetes type 2, controlled 10/25/2011  . HTN (hypertension) 10/25/2011  . Hyperlipidemia LDL goal <70 10/25/2011  . Morbid obesity 10/25/2011     Prior to Admission medications   Medication Sig Start Date End Date Taking? Authorizing Provider  aspirin 81 MG tablet Take 81 mg by mouth daily.   Yes Historical Provider, MD  naproxen sodium (ANAPROX) 220 MG tablet Take 220 mg by mouth as needed.   Yes Historical Provider, MD  oxybutynin (DITROPAN) 5 MG tablet Take 5 mg by mouth 2 (two) times daily.   Yes Historical Provider, MD     No Known Allergies     Objective:  Physical Exam  Constitutional: He is oriented to person, place,  and time. He appears well-developed and well-nourished. He is active and cooperative. No distress.  BP 163/100 mmHg  Pulse 91  Temp(Src) 98.6 F (37 C) (Oral)  Resp 16  Ht 5\' 4"  (1.626 m)  Wt 258 lb (117.028 kg)  BMI 44.26 kg/m2  SpO2 99%   Eyes: Conjunctivae are normal. No scleral icterus.  Neck: Neck supple. No thyromegaly present.  Cardiovascular: Normal rate, regular rhythm and normal heart sounds.   Pulmonary/Chest: Effort normal and breath sounds normal.  Abdominal: Hernia confirmed negative in the right inguinal area and confirmed negative in the left inguinal area.  Genitourinary: Testes normal and penis normal.    Circumcised.  Lymphadenopathy:    He has no cervical adenopathy.       Right: No inguinal adenopathy present.       Left: No inguinal adenopathy present.  Neurological: He is alert and oriented to person, place, and time.  Psychiatric: He has a normal mood and affect. His speech is normal and behavior is normal.   Results for orders placed or performed in visit on 10/07/14  POCT glucose (manual entry)  Result Value Ref Range   POC Glucose 400 (A) 70 - 99 mg/dl  POCT glycosylated hemoglobin (Hb A1C)  Result Value Ref Range   Hemoglobin A1C 14.0            Assessment & Plan:  1. Cellulitis, scrotum Spontaneously drained. Continue warm compresses.  Complete course of doxycycline. - doxycycline (VIBRAMYCIN) 100 MG capsule; Take 1 capsule (100 mg total) by mouth 2 (two) times daily.  Dispense: 20 capsule; Refill: 0  2. Polydipsia 3. Polyuria 4. Nocturia Due to loss of control of Diabetes. - POCT glucose (manual entry) - POCT glycosylated hemoglobin (Hb A1C)  5. Diabetes type 2, uncontrolled Loss of control since 06/2014. Resume Metformin at 500 mg BID x 1 week than increase to 100 mg BID. Reminded him of the importance of healthy eating and regular exercise! - metFORMIN (GLUCOPHAGE) 1000 MG tablet; Take 1 tablet (1,000 mg total) by mouth 2 (two)  times daily with a meal.  Dispense: 180 tablet; Refill: 3  6. Hyperlipidemia LDL goal <70 Await lab results. Anticipate elevated, especially TG. Will likely resume statin therapy.   Fara Chute, PA-C Physician Assistant-Certified [facility]

## 2014-10-07 NOTE — Patient Instructions (Signed)
Your blood sugar is totally out of control. You need to resume the metformin. Start by taking 1/2 tablet twice each day, and in one week, increase to a whole tablet twice each day. Get daily exercise. Make healthy eating choices.

## 2014-10-08 ENCOUNTER — Telehealth: Payer: Self-pay

## 2014-10-08 NOTE — Telephone Encounter (Signed)
Rodney Walsh states her husband in need of the diabetic machine and also the scripts. Please call [facility]

## 2014-10-09 ENCOUNTER — Encounter: Payer: Self-pay | Admitting: Physician Assistant

## 2014-10-09 MED ORDER — GLUCOSE BLOOD VI STRP: ORAL_STRIP | Status: DC

## 2014-10-09 MED ORDER — UNABLE TO FIND: Status: DC

## 2014-10-09 NOTE — Telephone Encounter (Signed)
Spoke with pt's

## 2014-10-09 NOTE — Telephone Encounter (Signed)
Spoke with pt's wife and she needs Accu-check meter and strips. Sent to pharmacy.

## 2014-10-23 ENCOUNTER — Ambulatory Visit: Payer: Self-pay | Admitting: Physician Assistant

## 2014-12-30 ENCOUNTER — Telehealth: Payer: Self-pay | Admitting: Physician Assistant

## 2014-12-30 NOTE — Telephone Encounter (Signed)
Patient request a refill on his blood pressure medication. [facility] on [address].

## 2014-12-31 NOTE — Telephone Encounter (Signed)
Need to clarify BP meds

## 2014-12-31 NOTE — Telephone Encounter (Signed)
Please call and verify. I do not see where pt is on a BP med

## 2015-01-06 ENCOUNTER — Telehealth: Payer: Self-pay | Admitting: *Deleted

## 2015-01-06 ENCOUNTER — Ambulatory Visit (INDEPENDENT_AMBULATORY_CARE_PROVIDER_SITE_OTHER): Payer: BC Managed Care – PPO | Admitting: Physician Assistant

## 2015-01-06 ENCOUNTER — Encounter: Payer: Self-pay | Admitting: Physician Assistant

## 2015-01-06 VITALS — BP 153/77 | HR 54 | Temp 98.3°F | Resp 16 | Ht 63.25 in | Wt 269.0 lb

## 2015-01-06 DIAGNOSIS — E1165 Type 2 diabetes mellitus with hyperglycemia: Secondary | ICD-10-CM | POA: Diagnosis not present

## 2015-01-06 DIAGNOSIS — E785 Hyperlipidemia, unspecified: Secondary | ICD-10-CM | POA: Diagnosis not present

## 2015-01-06 DIAGNOSIS — I10 Essential (primary) hypertension: Secondary | ICD-10-CM | POA: Diagnosis not present

## 2015-01-06 DIAGNOSIS — L309 Dermatitis, unspecified: Secondary | ICD-10-CM

## 2015-01-06 DIAGNOSIS — Z23 Encounter for immunization: Secondary | ICD-10-CM | POA: Diagnosis not present

## 2015-01-06 DIAGNOSIS — Z114 Encounter for screening for human immunodeficiency virus [HIV]: Secondary | ICD-10-CM | POA: Diagnosis not present

## 2015-01-06 DIAGNOSIS — IMO0002 Reserved for concepts with insufficient information to code with codable children: Secondary | ICD-10-CM

## 2015-01-06 DIAGNOSIS — R351 Nocturia: Secondary | ICD-10-CM | POA: Diagnosis not present

## 2015-01-06 DIAGNOSIS — L219 Seborrheic dermatitis, unspecified: Secondary | ICD-10-CM | POA: Diagnosis not present

## 2015-01-06 LAB — COMPREHENSIVE METABOLIC PANEL
ALBUMIN: 4.1 g/dL (ref 3.6–5.1)
ALT: 15 U/L (ref 9–46)
AST: 15 U/L (ref 10–35)
Alkaline Phosphatase: 51 U/L (ref 40–115)
BUN: 12 mg/dL (ref 7–25)
CALCIUM: 9.3 mg/dL (ref 8.6–10.3)
CHLORIDE: 103 mmol/L (ref 98–110)
CO2: 26 mmol/L (ref 20–31)
CREATININE: 0.91 mg/dL (ref 0.70–1.25)
Glucose, Bld: 97 mg/dL (ref 65–99)
POTASSIUM: 4.6 mmol/L (ref 3.5–5.3)
SODIUM: 138 mmol/L (ref 135–146)
TOTAL PROTEIN: 6.7 g/dL (ref 6.1–8.1)
Total Bilirubin: 0.5 mg/dL (ref 0.2–1.2)

## 2015-01-06 LAB — GLUCOSE, POCT (MANUAL RESULT ENTRY): POC GLUCOSE: 106 mg/dL — AB (ref 70–99)

## 2015-01-06 LAB — MICROALBUMIN, URINE: Microalb, Ur: 1.2 mg/dL (ref <2.0)

## 2015-01-06 LAB — POCT GLYCOSYLATED HEMOGLOBIN (HGB A1C): Hemoglobin A1C: 7.7

## 2015-01-06 LAB — HIV ANTIBODY (ROUTINE TESTING W REFLEX): HIV: NONREACTIVE

## 2015-01-06 MED ORDER — OXYBUTYNIN CHLORIDE 5 MG PO TABS: 5.0000 mg | ORAL_TABLET | Freq: Two times a day (BID) | ORAL | Status: DC

## 2015-01-06 MED ORDER — CLOBETASOL PROPIONATE 0.05 % EX OINT: 1.0000 "application " | TOPICAL_OINTMENT | Freq: Two times a day (BID) | CUTANEOUS | Status: DC

## 2015-01-06 MED ORDER — KETOCONAZOLE 2 % EX CREA: 1.0000 "application " | TOPICAL_CREAM | Freq: Two times a day (BID) | CUTANEOUS | Status: DC | PRN

## 2015-01-06 MED ORDER — METFORMIN HCL 1000 MG PO TABS: 1000.0000 mg | ORAL_TABLET | Freq: Two times a day (BID) | ORAL | Status: DC

## 2015-01-06 MED ORDER — LISINOPRIL 10 MG PO TABS: 10.0000 mg | ORAL_TABLET | Freq: Every day | ORAL | Status: DC

## 2015-01-06 NOTE — Telephone Encounter (Signed)
Pt wife called wanting to verify the medications he received and where they had went to.  She was also concerned that they received an $10 charge on her debit card from somewhere called [employer].  I explained to the pt that it was likely from [facility] charging them for the 2 Rx that they had received electronically.  Wife understood.

## 2015-01-06 NOTE — Patient Instructions (Addendum)
I will contact you with your lab results as soon as they are available.   If you have not heard from me in 2 weeks, please contact me.  The fastest way to get your results is to register for My Chart (see the instructions on the last page of this printout).   Influenza Vaccine (Flu Vaccine, Inactivated or Recombinant) 2014-2015: What You Need to Know 1. Why get vaccinated? Influenza ("flu") is a contagious disease that spreads around the [place] every winter, usually between October and May. Flu is caused by influenza viruses, and is spread mainly by coughing, sneezing, and close contact. Anyone can get flu, but the risk of getting flu is highest among children. Symptoms come on suddenly and may last several days. They can include:  fever/chills  sore throat  muscle aches  fatigue  cough  headache  runny or stuffy nose Flu can make some people much sicker than others. These people include young children, people 80 and older, pregnant women, and people with certain health conditions-such as heart, lung or kidney disease, nervous system disorders, or a weakened immune system. Flu vaccination is especially important for these people, and anyone in close contact with them. Flu can also lead to pneumonia, and make existing medical conditions worse. It can cause diarrhea and seizures in children. Each year thousands of people in the [place] die from flu, and many more are hospitalized. Flu vaccine is the best protection against flu and its complications. Flu vaccine also helps prevent spreading flu from person to person. 2. Inactivated and recombinant flu vaccines You are getting an injectable flu vaccine, which is either an "inactivated" or "recombinant" vaccine. These vaccines do not contain any live influenza virus. They are given by injection with a needle, and often called the "flu shot."  A different live, attenuated (weakened) influenza vaccine is sprayed into the nostrils.  This vaccine is described in a separate Vaccine Information Statement. Flu vaccination is recommended every year. Some children 6 months through 82 years of age might need two doses during one year. Flu viruses are always changing. Each year's flu vaccine is made to protect against 3 or 4 viruses that are likely to cause disease that year. Flu vaccine cannot prevent all cases of flu, but it is the best defense against the disease.  It takes about 2 weeks for protection to develop after the vaccination, and protection lasts several months to a year. Some illnesses that are not caused by influenza virus are often mistaken for flu. Flu vaccine will not prevent these illnesses. It can only prevent influenza. Some inactivated flu vaccine contains a very small amount of a mercury-based preservative called thimerosal. Studies have shown that thimerosal in vaccines is not harmful, but flu vaccines that do not contain a preservative are available. 3. Some people should not get this vaccine Tell the person who gives you the vaccine:  If you have any severe, life-threatening allergies. If you ever had a life-threatening allergic reaction after a dose of flu vaccine, or have a severe allergy to any part of this vaccine, including (for example) an allergy to gelatin, antibiotics, or eggs, you may be advised not to get vaccinated. Most, but not all, types of flu vaccine contain a small amount of egg protein.  If you ever had Guillain-Barr Syndrome (a severe paralyzing illness, also called GBS). Some people with a history of GBS should not get this vaccine. This should be discussed with your doctor.  If you  are not feeling well. It is usually okay to get flu vaccine when you have a mild illness, but you might be advised to wait until you feel better. You should come back when you are better. 4. Risks of a vaccine reaction With a vaccine, like any medicine, there is a chance of side effects. These are usually mild  and go away on their own. Problems that could happen after any vaccine:  Brief fainting spells can happen after any medical procedure, including vaccination. Sitting or lying down for about 15 minutes can help prevent fainting, and injuries caused by a fall. Tell your doctor if you feel dizzy, or have vision changes or ringing in the ears.  Severe shoulder pain and reduced range of motion in the arm where a shot was given can happen, very rarely, after a vaccination.  Severe allergic reactions from a vaccine are very rare, estimated at less than 1 in a million doses. If one were to occur, it would usually be within a few minutes to a few hours after the vaccination. Mild problems following inactivated flu vaccine:  soreness, redness, or swelling where the shot was given  hoarseness  sore, red or itchy eyes  cough  fever  aches  headache  itching  fatigue If these problems occur, they usually begin soon after the shot and last 1 or 2 days. Moderate problems following inactivated flu vaccine:  Young children who get inactivated flu vaccine and pneumococcal vaccine (PCV13) at the same time may be at increased risk for seizures caused by fever. Ask your doctor for more information. Tell your doctor if a child who is getting flu vaccine has ever had a seizure. Inactivated flu vaccine does not contain live flu virus, so you cannot get the flu from this vaccine. As with any medicine, there is a very remote chance of a vaccine causing a serious injury or death. The safety of vaccines is always being monitored. For more information, visit: http://www.aguilar.org/ 5. What if there is a serious reaction? What should I look for?  Look for anything that concerns you, such as signs of a severe allergic reaction, very high fever, or behavior changes. Signs of a severe allergic reaction can include hives, swelling of the face and throat, difficulty breathing, a fast heartbeat, dizziness, and  weakness. These would start a few minutes to a few hours after the vaccination. What should I do?  If you think it is a severe allergic reaction or other emergency that can't wait, call 9-1-1 and get the person to the nearest hospital. Otherwise, call your doctor.  Afterward, the reaction should be reported to the Vaccine Adverse Event Reporting System (VAERS). Your doctor should file this report, or you can do it yourself through the VAERS web site at www.vaers.SamedayNews.es, or by calling (408)878-7323. VAERS does not give medical advice. 6. The National Vaccine Injury Compensation Program The [employer] Vaccine Injury Compensation Program (VICP) is a federal program that was created to compensate people who may have been injured by certain vaccines. Persons who believe they may have been injured by a vaccine can learn about the program and about filing a claim by calling 918-274-1381 or visiting the [place] website at GoldCloset.com.ee. There is a time limit to file a claim for compensation. 7. How can I learn more?  Ask your health care provider.  Call your local or state health department.  Contact the Centers for Disease Control and Prevention (CDC):  Call 928 592 8472 (1-800-CDC-INFO) or  Visit  CDC's website at https://gibson.com/ [place] (Interim) Inactivated Influenza Vaccine (12/25/2012) Document Released: 02/17/2006 Document Revised: 09/09/2013 Document Reviewed: 04/12/2013 [facility] Patient Information 2015 [place]. This information is not intended to replace advice given to you by your health care provider. Make sure you discuss any questions you have with your health care provider.

## 2015-01-06 NOTE — Progress Notes (Signed)
Patient ID: Rodney Walsh, male    DOB: 18-Mar-1951, 64 y.o.   MRN: [id number]  PCP: Wynne Dust  Subjective:   Chief Complaint  Patient presents with  . Follow-up    3 mth up  - Diabetes    HPI Presents for evaluation of diabetes, HTN.  Home glucose readings 121-140 (fasting) Home BP readings 158/80, generally 676'H systolic. He stopped lisinopril some time ago, so when he called last week requesting a refill, he was told that he didn't have one to refill.  Has an itchy place on the RIGHT lower leg. He's been applying Silvadene cream which helps reduce the itching.  Chronic crusting on the forehead and behind the ears. Less bothersome if he keeps the area well-lotioned.  "I'm not sleeping like I should."  Wakes every couple of hours, able to fall back asleep. Falls asleep watching TV in the evening. Used to take Trazodone, which helped, but he doesn't like taking it, primarily due to cost. Using Tylenol PM now, the effect of which lasts only about 3 hours, but he falls back asleep easily, and wakes feeling rested.  Review of Systems  Constitutional: Negative for activity change, appetite change, fatigue and unexpected weight change.  HENT: Negative for congestion, dental problem, ear pain, hearing loss, mouth sores, postnasal drip, rhinorrhea, sneezing, sore throat, tinnitus and trouble swallowing.   Eyes: Negative for photophobia, pain, redness and visual disturbance.  Respiratory: Negative for cough, chest tightness and shortness of breath.   Cardiovascular: Negative for chest pain, palpitations and leg swelling.  Gastrointestinal: Negative for nausea, vomiting, abdominal pain, diarrhea, constipation and blood in stool.  Genitourinary: Negative for dysuria, urgency, frequency and hematuria.  Musculoskeletal: Negative for myalgias, arthralgias, gait problem and neck stiffness.  Skin: Negative for rash.  Neurological: Negative for dizziness, speech difficulty,  weakness, light-headedness, numbness and headaches.  Hematological: Negative for adenopathy.  Psychiatric/Behavioral: Negative for confusion and sleep disturbance. The patient is not nervous/anxious.        Patient Active Problem List   Diagnosis Date Noted  . Nocturia 06/10/2014  . Cataract 09/25/2012  . OSA (obstructive sleep apnea) 08/10/2012  . Eczema 08/10/2012  . Vitamin D deficiency 08/10/2012  . Erectile dysfunction 08/10/2012  . Special screening for malignant neoplasms, colon 07/17/2012  . Personal history of adenomatous colonic polyps 04/08/2012  . Prostate cancer 01/04/2012  . Diabetes type 2, uncontrolled 10/25/2011  . HTN (hypertension) 10/25/2011  . Hyperlipidemia LDL goal <70 10/25/2011  . Morbid obesity 10/25/2011     Prior to Admission medications   Medication Sig Start Date End Date Taking? Authorizing Provider  aspirin 81 MG tablet Take 81 mg by mouth daily.   Yes Historical Provider, MD  glucose blood test strip Use as instructed 10/09/14  Yes Barrie Wale, PA-C  metFORMIN (GLUCOPHAGE) 1000 MG tablet Take 1 tablet (1,000 mg total) by mouth 2 (two) times daily with a meal. 10/07/14  Yes Antonisha Waskey, PA-C  naproxen sodium (ANAPROX) 220 MG tablet Take 220 mg by mouth as needed.   Yes Historical Provider, MD  oxybutynin (DITROPAN) 5 MG tablet Take 5 mg by mouth 2 (two) times daily.   Yes Historical Provider, MD  UNABLE TO FIND Glucose meter Accu-check which ever one insurance pays for.  DX: E11.9 10/09/14  Yes Julaine Zimny, PA-C     No Known Allergies     Objective:  Physical Exam  Constitutional: He is oriented to person, place, and time. Vital signs are normal. He  appears well-developed and well-nourished. He is active and cooperative. No distress.  BP 153/77 mmHg  Pulse 54  Temp(Src) 98.3 F (36.8 C) (Oral)  Resp 16  Ht 5' 3.25" (1.607 m)  Wt 269 lb (122.018 kg)  BMI 47.25 kg/m2  SpO2 98%  HENT:  Head: Normocephalic and atraumatic.  Right  Ear: Hearing normal.  Left Ear: Hearing normal.  Eyes: Conjunctivae are normal. No scleral icterus.  Neck: Normal range of motion. Neck supple. No thyromegaly present.  Cardiovascular: Normal rate, regular rhythm and normal heart sounds.   Pulses:      Radial pulses are 2+ on the right side, and 2+ on the left side.  Pulmonary/Chest: Effort normal and breath sounds normal.  Lymphadenopathy:       Head (right side): No tonsillar, no preauricular, no posterior auricular and no occipital adenopathy present.       Head (left side): No tonsillar, no preauricular, no posterior auricular and no occipital adenopathy present.    He has no cervical adenopathy.       Right: No supraclavicular adenopathy present.       Left: No supraclavicular adenopathy present.  Neurological: He is alert and oriented to person, place, and time. No sensory deficit.  Skin: Skin is warm, dry and intact. Lesion (4 cm x 12 cm eczematous lesion on the lateral RIGHT lower leg) noted. No rash noted. No cyanosis or erythema. Nails show no clubbing.  Psychiatric: He has a normal mood and affect.   Diabetic Foot Exam - Simple   Simple Foot Form  Diabetic Foot exam was performed with the following findings:  Yes 01/06/2015  1:11 PM  Visual Inspection  No deformities, no ulcerations, no other skin breakdown bilaterally:  Yes  Sensation Testing  Intact to touch and monofilament testing bilaterally:  Yes  Pulse Check  Posterior Tibialis and Dorsalis pulse intact bilaterally:  Yes  Comments     Results for orders placed or performed in visit on 01/06/15  POCT glucose (manual entry)  Result Value Ref Range   POC Glucose 106 (A) 70 - 99 mg/dl  POCT glycosylated hemoglobin (Hb A1C)  Result Value Ref Range   Hemoglobin A1C 7.7             Assessment & Plan:  1. Diabetes type 2, controlled Adequate control. Significantly improved from February, when A1C was 14. Continue current treatment and lifestyle changes. - POCT  glucose (manual entry) - POCT glycosylated hemoglobin (Hb A1C) - Microalbumin, urine - metFORMIN (GLUCOPHAGE) 1000 MG tablet; Take 1 tablet (1,000 mg total) by mouth 2 (two) times daily with a meal.  Dispense: 180 tablet; Refill: 3  2. Essential hypertension Uncontrolled. Resume lisinopril. - Comprehensive metabolic panel  3. Hyperlipidemia LDL goal <70 Await labs.  4. Morbid obesity Lifestyle changes for weight loss.  5. Seborrheic dermatitis Ketoconazole 2% cream BID prn.  6. Screening for HIV (human immunodeficiency virus) - HIV antibody  7. Need for influenza vaccination - Flu Vaccine QUAD 36+ mos IM  8. Nocturia Stable. Continue current treatment. - oxybutynin (DITROPAN) 5 MG tablet; Take 1 tablet (5 mg total) by mouth 2 (two) times daily.  Dispense: 180 tablet; Refill: 3  9. Eczema Topical steroid cream. - clobetasol ointment (TEMOVATE) 0.05 %; Apply 1 application topically 2 (two) times daily. To the lesion on the LEG.  Dispense: 60 g; Refill: 0  Return in about 3 months (around 04/08/2015) for re-evaluation of diabetes.  Fara Chute, PA-C Physician Assistant-Certified [facility]

## 2015-01-15 ENCOUNTER — Encounter: Payer: Self-pay | Admitting: Physician Assistant

## 2015-02-16 ENCOUNTER — Other Ambulatory Visit: Payer: Self-pay | Admitting: Physician Assistant

## 2015-02-20 ENCOUNTER — Ambulatory Visit (INDEPENDENT_AMBULATORY_CARE_PROVIDER_SITE_OTHER): Payer: BC Managed Care – PPO | Admitting: Physician Assistant

## 2015-02-20 VITALS — BP 140/84 | HR 79 | Temp 98.3°F | Resp 18 | Ht 63.0 in | Wt 271.0 lb

## 2015-02-20 DIAGNOSIS — I10 Essential (primary) hypertension: Secondary | ICD-10-CM | POA: Diagnosis not present

## 2015-02-20 MED ORDER — LISINOPRIL 20 MG PO TABS: 20.0000 mg | ORAL_TABLET | Freq: Every day | ORAL | Status: DC

## 2015-02-20 NOTE — Patient Instructions (Signed)
Continue checking your blood pressure several times each week. Record the results and bring the readings to your next visit with me for review. Increase the dietary fiber to help with constipation.

## 2015-02-20 NOTE — Progress Notes (Signed)
Patient ID: Rodney Walsh, male    DOB: [birth year], 64 y.o.   MRN: [id number]  PCP: Wynne Dust  Subjective:   Chief Complaint  Patient presents with  . Follow-up  . Hypertension    HPI Presents for evaluation of blood pressure.  Home blood pressure has been "going crazy." 123/92 this morning. 150/90 another time. Usually checks in the LEFT arm and notes different readings depending where on his arm he puts the cuff.  Has increased his BP medication to BID because his BP wasn't well controlled in the mornings. He noticed no improvement. When he ran out of them, he noticed that the constipation he had resolved. Now eating 2-4 prunes daily. Doesn't like Miralax, because he wants to be able to go on his own.  Eats a small honey bun and a cup of coffee each morning, and then a pack of small nabs. Eats again about 12 or 1 pm, and then again in the evening. Reports that glucose is good, 110's-135.  He's gained some weight, about 5 lbs. Trying to exercise more.  No CP, SOB, HA, dizziness.    Review of Systems  Constitutional: Negative for fever, activity change, appetite change and fatigue.  Respiratory: Negative for cough, chest tightness, shortness of breath and wheezing.   Cardiovascular: Negative for chest pain, palpitations and leg swelling.  Gastrointestinal: Positive for constipation. Negative for nausea, vomiting and diarrhea.  Genitourinary: Negative for dysuria, urgency, frequency and hematuria.       Patient Active Problem List   Diagnosis Date Noted  . Nocturia 06/10/2014  . Cataract 09/25/2012  . OSA (obstructive sleep apnea) 08/10/2012  . Eczema 08/10/2012  . Vitamin D deficiency 08/10/2012  . Erectile dysfunction 08/10/2012  . Special screening for malignant neoplasms, colon 07/17/2012  . Personal history of adenomatous colonic polyps 04/08/2012  . Prostate cancer ([place]) 01/04/2012  . Diabetes type 2, controlled ([place]) 10/25/2011  . HTN  (hypertension) 10/25/2011  . Hyperlipidemia LDL goal <70 10/25/2011  . Morbid obesity ([place]) 10/25/2011     Prior to Admission medications   Medication Sig Start Date End Date Taking? Authorizing Provider  aspirin 81 MG tablet Take 81 mg by mouth daily.   Yes Historical Provider, MD  Blood Glucose Monitoring Suppl (ACCU-CHEK AVIVA PLUS) W/DEVICE KIT  10/14/14  Yes Historical Provider, MD  clobetasol ointment (TEMOVATE) 8.84 % Apply 1 application topically 2 (two) times daily. To the lesion on the LEG. 01/06/15  Yes  , PA-C  glucose blood test strip Use as instructed 10/09/14  Yes  , PA-C  ketoconazole (NIZORAL) 2 % cream Apply 1 application topically 2 (two) times daily as needed for irritation. 01/06/15  Yes  , PA-C  lisinopril (PRINIVIL,ZESTRIL) 10 MG tablet Take 1 tablet (10 mg total) by mouth daily. 01/06/15  Yes  , PA-C  metFORMIN (GLUCOPHAGE) 1000 MG tablet Take 1 tablet (1,000 mg total) by mouth 2 (two) times daily with a meal. 01/06/15  Yes  , PA-C  naproxen sodium (ANAPROX) 220 MG tablet Take 220 mg by mouth as needed.    Historical Provider, MD  oxybutynin (DITROPAN) 5 MG tablet Take 1 tablet (5 mg total) by mouth 2 (two) times daily. Patient not taking: Reported on 02/20/2015 01/06/15    , PA-C     No Known Allergies     Objective:  Physical Exam  Constitutional: He is oriented to person, place, and time. Vital signs are normal. He appears well-developed and well-nourished. He is active  and cooperative. No distress.  BP 140/84 mmHg  Pulse 79  Temp(Src) 98.3 F (36.8 C) (Oral)  Resp 18  Ht 5' 3" (1.6 m)  Wt 271 lb (122.925 kg)  BMI 48.02 kg/m2  SpO2 96%  HENT:  Head: Normocephalic and atraumatic.  Right Ear: Hearing normal.  Left Ear: Hearing normal.  Eyes: Conjunctivae are normal. No scleral icterus.  Neck: Normal range of motion. Neck supple. No thyromegaly present.  Cardiovascular: Normal rate,  regular rhythm and normal heart sounds.   Pulses:      Radial pulses are 2+ on the right side, and 2+ on the left side.  Pulmonary/Chest: Effort normal and breath sounds normal.  Lymphadenopathy:       Head (right side): No tonsillar, no preauricular, no posterior auricular and no occipital adenopathy present.       Head (left side): No tonsillar, no preauricular, no posterior auricular and no occipital adenopathy present.    He has no cervical adenopathy.       Right: No supraclavicular adenopathy present.       Left: No supraclavicular adenopathy present.  Neurological: He is alert and oriented to person, place, and time. No sensory deficit.  Skin: Skin is warm, dry and intact. No rash noted. No cyanosis or erythema. Nails show no clubbing.  Psychiatric: He has a normal mood and affect.           Assessment & Plan:   1. Essential hypertension Above goal most checks at home. Increase to lisinopril 20 mg. Increase dietary fiber to address constipation, which may actually be aggravated by the oxybutinin. We could consider switching from immediate to extended release of that product in the future. Check BP at home several times each week and bring a record for my review to his next appointment next month. - lisinopril (PRINIVIL,ZESTRIL) 20 MG tablet; Take 1 tablet (20 mg total) by mouth daily.  Dispense: 30 tablet; Refill: 3   Fara Chute, PA-C Physician Assistant-Certified [facility]

## 2015-02-23 ENCOUNTER — Encounter: Payer: Self-pay | Admitting: Physician Assistant

## 2015-02-24 ENCOUNTER — Encounter: Payer: Self-pay | Admitting: Physician Assistant

## 2015-03-04 ENCOUNTER — Encounter: Payer: Self-pay | Admitting: Physician Assistant

## 2015-03-09 ENCOUNTER — Encounter: Payer: Self-pay | Admitting: Physician Assistant

## 2015-03-25 ENCOUNTER — Encounter: Payer: Self-pay | Admitting: Physician Assistant

## 2015-03-28 ENCOUNTER — Encounter: Payer: Self-pay | Admitting: Physician Assistant

## 2015-04-07 ENCOUNTER — Ambulatory Visit: Payer: BC Managed Care – PPO | Admitting: Physician Assistant

## 2015-04-14 ENCOUNTER — Encounter: Payer: Self-pay | Admitting: Physician Assistant

## 2015-04-14 ENCOUNTER — Ambulatory Visit (INDEPENDENT_AMBULATORY_CARE_PROVIDER_SITE_OTHER): Payer: Medicare Other | Admitting: Physician Assistant

## 2015-04-14 VITALS — BP 150/96 | HR 75 | Temp 98.8°F | Resp 16 | Ht 63.5 in | Wt 278.4 lb

## 2015-04-14 DIAGNOSIS — M25512 Pain in left shoulder: Secondary | ICD-10-CM

## 2015-04-14 DIAGNOSIS — E785 Hyperlipidemia, unspecified: Secondary | ICD-10-CM | POA: Diagnosis not present

## 2015-04-14 DIAGNOSIS — I10 Essential (primary) hypertension: Secondary | ICD-10-CM | POA: Diagnosis not present

## 2015-04-14 DIAGNOSIS — R29898 Other symptoms and signs involving the musculoskeletal system: Secondary | ICD-10-CM

## 2015-04-14 DIAGNOSIS — M2669 Other specified disorders of temporomandibular joint: Secondary | ICD-10-CM | POA: Diagnosis not present

## 2015-04-14 DIAGNOSIS — E119 Type 2 diabetes mellitus without complications: Secondary | ICD-10-CM

## 2015-04-14 DIAGNOSIS — Z23 Encounter for immunization: Secondary | ICD-10-CM

## 2015-04-14 LAB — COMPREHENSIVE METABOLIC PANEL
ALBUMIN: 4.1 g/dL (ref 3.6–5.1)
ALT: 16 U/L (ref 9–46)
AST: 14 U/L (ref 10–35)
Alkaline Phosphatase: 58 U/L (ref 40–115)
BUN: 12 mg/dL (ref 7–25)
CALCIUM: 8.9 mg/dL (ref 8.6–10.3)
CHLORIDE: 104 mmol/L (ref 98–110)
CO2: 27 mmol/L (ref 20–31)
Creat: 0.85 mg/dL (ref 0.70–1.25)
GLUCOSE: 73 mg/dL (ref 65–99)
Potassium: 4.5 mmol/L (ref 3.5–5.3)
Sodium: 137 mmol/L (ref 135–146)
Total Bilirubin: 0.4 mg/dL (ref 0.2–1.2)
Total Protein: 6.8 g/dL (ref 6.1–8.1)

## 2015-04-14 LAB — LIPID PANEL
CHOL/HDL RATIO: 4 ratio (ref <=5.0)
Cholesterol: 164 mg/dL (ref 125–200)
HDL: 41 mg/dL (ref >=40)
LDL CALC: 101 mg/dL (ref <130)
TRIGLYCERIDES: 109 mg/dL (ref <150)
VLDL: 22 mg/dL (ref <30)

## 2015-04-14 LAB — GLUCOSE, POCT (MANUAL RESULT ENTRY): POC GLUCOSE: 91 mg/dL (ref 70–99)

## 2015-04-14 LAB — POCT GLYCOSYLATED HEMOGLOBIN (HGB A1C): Hemoglobin A1C: 6.1

## 2015-04-14 MED ORDER — NAPROXEN SODIUM 220 MG PO TABS: 220.0000 mg | ORAL_TABLET | Freq: Two times a day (BID) | ORAL | Status: DC

## 2015-04-14 MED ORDER — LISINOPRIL 20 MG PO TABS: 20.0000 mg | ORAL_TABLET | Freq: Every day | ORAL | Status: DC

## 2015-04-14 NOTE — Patient Instructions (Addendum)
You are due for a tetanus booster. Please check with your secondary insurance to see if they cover it (Td or Tdap), since Medicare does NOT cover it unless you have a wound (then they cover a Td). Please schedule an eye exam.

## 2015-04-14 NOTE — Progress Notes (Signed)
Patient ID: Rodney Walsh, male    DOB: 17-Feb-1951, 64 y.o.   MRN: [id number]  PCP: Wynne Dust  Subjective:   Chief Complaint  Patient presents with  . Follow-up  . Hypertension  . Medication Refill    Lisinopril 20 mg, Anaprox 220 mg    HPI Presents for evaluation of HTN, diabetes.  He has been working on regaining control of glucose and BP. Had started riding his bike, but overdid it a bit and has slacked off since then. Notes that he and his wife cleaned out the exercise room so that they can use the equipment for exercise now, but haven't.  Ran out of bp medication about 8 days ago.  LEFT shoulder pain. Used to take naprosyn. Increased pain at night when he's lying down. His wife notes that he has trouble putting his coat on.  Not interested in seeing a specialist or considering an injection.  Has a popping in the RIGHT when he opens his mouth wide. Has been doing more chewing lately, as he doesn't have all his teeth. It's not hurting, just pops.  Review of Systems  Constitutional: Negative for fever, chills, activity change, appetite change and fatigue.  Eyes: Negative for visual disturbance.  Musculoskeletal: Positive for arthralgias (LEFT shoulder).  Neurological: Negative for dizziness, weakness, light-headedness, numbness and headaches.  Hematological: Negative for adenopathy.  Psychiatric/Behavioral: Positive for sleep disturbance (from shoulder pain; not interested in a sleeping pill).       Patient Active Problem List   Diagnosis Date Noted  . Nocturia 06/10/2014  . Cataract 09/25/2012  . OSA (obstructive sleep apnea) 08/10/2012  . Eczema 08/10/2012  . Vitamin D deficiency 08/10/2012  . Erectile dysfunction 08/10/2012  . Special screening for malignant neoplasms, colon 07/17/2012  . Personal history of adenomatous colonic polyps 04/08/2012  . Prostate cancer ([place]) 01/04/2012  . Diabetes type 2, controlled ([place]) 10/25/2011  . HTN  (hypertension) 10/25/2011  . Hyperlipidemia LDL goal <70 10/25/2011  . Morbid obesity ([place]) 10/25/2011     Prior to Admission medications   Medication Sig Start Date End Date Taking? Authorizing Provider  aspirin 81 MG tablet Take 81 mg by mouth daily.   Yes Historical Provider, MD  Blood Glucose Monitoring Suppl (ACCU-CHEK AVIVA PLUS) W/DEVICE KIT  10/14/14  Yes Historical Provider, MD  clobetasol ointment (TEMOVATE) 1.19 % Apply 1 application topically 2 (two) times daily. To the lesion on the LEG. 01/06/15  Yes Renae Mottley, PA-C  glucose blood test strip Use as instructed 10/09/14  Yes Shardea Cwynar, PA-C  ketoconazole (NIZORAL) 2 % cream Apply 1 application topically 2 (two) times daily as needed for irritation. 01/06/15  Yes Demosthenes Virnig, PA-C  lisinopril (PRINIVIL,ZESTRIL) 20 MG tablet Take 1 tablet (20 mg total) by mouth daily. 02/20/15  Yes Brayley Mackowiak, PA-C  metFORMIN (GLUCOPHAGE) 1000 MG tablet Take 1 tablet (1,000 mg total) by mouth 2 (two) times daily with a meal. 01/06/15  Yes Mollye Guinta, PA-C  oxybutynin (DITROPAN) 5 MG tablet Take 1 tablet (5 mg total) by mouth 2 (two) times daily. 01/06/15  Yes Alaia Lordi, PA-C  naproxen sodium (ANAPROX) 220 MG tablet Take 220 mg by mouth as needed.    Historical Provider, MD     No Known Allergies     Objective:  Physical Exam  Constitutional: He is oriented to person, place, and time. Vital signs are normal. He appears well-developed and well-nourished. He is active and cooperative. No distress.  BP 150/96 mmHg  Pulse 75  Temp(Src) 98.8 F (37.1 C) (Oral)  Resp 16  Ht 5' 3.5" (1.613 m)  Wt 278 lb 6.4 oz (126.281 kg)  BMI 48.54 kg/m2  SpO2 97%  HENT:  Head: Normocephalic and atraumatic.  Right Ear: Hearing normal.  Left Ear: Hearing normal.  Eyes: Conjunctivae are normal. No scleral icterus.  Neck: Normal range of motion. Neck supple. No thyromegaly present.  Cardiovascular: Normal rate, regular rhythm and normal  heart sounds.   Pulses:      Radial pulses are 2+ on the right side, and 2+ on the left side.  Pulmonary/Chest: Effort normal and breath sounds normal.  Lymphadenopathy:       Head (right side): No tonsillar, no preauricular, no posterior auricular and no occipital adenopathy present.       Head (left side): No tonsillar, no preauricular, no posterior auricular and no occipital adenopathy present.    He has no cervical adenopathy.       Right: No supraclavicular adenopathy present.       Left: No supraclavicular adenopathy present.  Neurological: He is alert and oriented to person, place, and time. No sensory deficit.  Skin: Skin is warm, dry and intact. No rash noted. No cyanosis or erythema. Nails show no clubbing.  Psychiatric: He has a normal mood and affect. His speech is normal and behavior is normal.           Assessment & Plan:   1. Controlled type 2 diabetes mellitus without complication, without long-term current use of insulin ([place]) Await lab results and adjust medications if needed. - POCT glucose (manual entry) - POCT glycosylated hemoglobin (Hb A1C) - Comprehensive metabolic panel  2. Essential hypertension Uncontrolled, out of medications. Lifestyle changes reviewed. Resume regular medications.  - Comprehensive metabolic panel - lisinopril (PRINIVIL,ZESTRIL) 20 MG tablet; Take 1 tablet (20 mg total) by mouth daily.  Dispense: 90 tablet; Refill: 3  3. Hyperlipidemia LDL goal <70 Await lab results. - Lipid panel  4. Morbid obesity, unspecified obesity type ([place]) Weight is rising again. He'd lost quite a lot, but due to uncontrolled glucose. Encouraged increased exercise and healthier eating choices.  5. Need for pneumococcal vaccination [id number] today. Booster dose of Pneumovax due in 12 months. - Pneumococcal conjugate vaccine 13-valent IM  6. Need for Tdap vaccination He'll check with his secondary insurance to see what coverage there is for this  vaccine.  7. Pain in joint of left shoulder He's not interested in additional evaluation today. He will try restarting the Naprosyn and see if that helps again. If not, we'll proceed with additional evaluation.  8. TMJ click Counseled on chewing and foods that can exacerbate this. Trial of naprosyn. - naproxen sodium (ANAPROX) 220 MG tablet; Take 1 tablet (220 mg total) by mouth 2 (two) times daily with a meal.  Dispense: 90 tablet; Refill: 1   Return in about 3 months (around 07/13/2015).   Fara Chute, PA-C Physician Assistant-Certified [facility]

## 2015-04-16 ENCOUNTER — Encounter: Payer: Self-pay | Admitting: Physician Assistant

## 2015-04-20 ENCOUNTER — Encounter: Payer: Self-pay | Admitting: Physician Assistant

## 2015-04-22 ENCOUNTER — Encounter: Payer: Self-pay | Admitting: Physician Assistant

## 2015-04-22 ENCOUNTER — Other Ambulatory Visit: Payer: Self-pay

## 2015-04-22 DIAGNOSIS — R29898 Other symptoms and signs involving the musculoskeletal system: Secondary | ICD-10-CM

## 2015-04-22 MED ORDER — NAPROXEN SODIUM 220 MG PO TABS: 220.0000 mg | ORAL_TABLET | Freq: Two times a day (BID) | ORAL | Status: DC

## 2015-04-23 ENCOUNTER — Encounter: Payer: Self-pay | Admitting: Physician Assistant

## 2015-04-27 ENCOUNTER — Other Ambulatory Visit: Payer: Self-pay

## 2015-04-27 DIAGNOSIS — I10 Essential (primary) hypertension: Secondary | ICD-10-CM

## 2015-04-27 DIAGNOSIS — R351 Nocturia: Secondary | ICD-10-CM

## 2015-04-27 MED ORDER — OXYBUTYNIN CHLORIDE 5 MG PO TABS: 5.0000 mg | ORAL_TABLET | Freq: Two times a day (BID) | ORAL | Status: DC

## 2015-04-27 MED ORDER — METFORMIN HCL 1000 MG PO TABS: 1000.0000 mg | ORAL_TABLET | Freq: Two times a day (BID) | ORAL | Status: DC

## 2015-04-27 MED ORDER — LISINOPRIL 20 MG PO TABS: 20.0000 mg | ORAL_TABLET | Freq: Every day | ORAL | Status: DC

## 2015-05-21 ENCOUNTER — Encounter: Payer: Self-pay | Admitting: Physician Assistant

## 2015-05-23 ENCOUNTER — Telehealth: Payer: Self-pay | Admitting: Physician Assistant

## 2015-05-23 NOTE — Telephone Encounter (Signed)
He can come in anytime for tetanus booster. We can also do it at his next visit with me. Also, I have responded to every one of his My Chart messages that I have received. I am sorry that he has not had responses. I am glad to hear that he is finding benefit with the B12. I am concerned about his dizziness, however, and recommend that he come in for evaluation.

## 2015-05-23 NOTE — Telephone Encounter (Signed)
Patient stated his insurance will cover the Tetanus shot. Patient want to know when is a good time to come in an get the injection. Patient stated he has been taking B12. Patient stated he sent out several messages through [facility] and no response. The B12 is calming the dizzness down some. [phone].

## 2015-05-26 ENCOUNTER — Encounter: Payer: Self-pay | Admitting: Physician Assistant

## 2015-05-26 NOTE — Telephone Encounter (Signed)
Spoke with pt, advised message. Pt understood. 

## 2015-05-27 ENCOUNTER — Ambulatory Visit (INDEPENDENT_AMBULATORY_CARE_PROVIDER_SITE_OTHER): Payer: Medicare Other | Admitting: Physician Assistant

## 2015-05-27 VITALS — BP 138/86 | HR 81 | Temp 98.4°F | Resp 18 | Ht 64.0 in | Wt 280.0 lb

## 2015-05-27 DIAGNOSIS — Z23 Encounter for immunization: Secondary | ICD-10-CM | POA: Diagnosis not present

## 2015-05-27 DIAGNOSIS — H8111 Benign paroxysmal vertigo, right ear: Secondary | ICD-10-CM | POA: Diagnosis not present

## 2015-05-27 NOTE — Progress Notes (Signed)
Subjective:    Patient ID: Rodney Walsh, male    DOB: 03/09/1951, 65 y.o.   MRN: [id number]  Chief Complaint  Patient presents with  . Dizziness    x 1 week  . Immunizations    TDAP   HPI Patient presents today for 1 hx of dizziness. He describes the dizziness as the room spinning very fast and feeling like he is going to lose his balance. The dizziness occurs for less than 1 minute, usually around 4 seconds, and occurs with positional changes and head movements. He has taken B12 to try and relieve the symptoms, noting it has helped some but denies full relief. He denies, visual disturbances, tinnitus, syncope, weakness, or numbness.  On 05/20/15 he notes associated nausea, shaking and dry heaving with a dizzy spell but is unsure if this is related. His wife thought it was his sugar as it was relieved after he ate an orange. When he checked his sugar and BP when they returned home they were 171bpm and 131/36mHg respectivley.   [facility].   Review of Systems  HENT: Negative for ear discharge, ear pain and hearing loss.   Eyes: Negative for pain and visual disturbance.  Respiratory: Negative for shortness of breath.   Gastrointestinal: Positive for nausea. Vomiting: dry-heaves.  Neurological: Positive for dizziness and light-headedness. Negative for tremors, syncope, facial asymmetry, speech difficulty, weakness, numbness and headaches.   No Known Allergies  Prior to Admission medications   Medication Sig Start Date End Date Taking? Authorizing Provider  aspirin 81 MG tablet Take 81 mg by mouth daily.   Yes Historical Provider, MD  Blood Glucose Monitoring Suppl (ACCU-CHEK AVIVA PLUS) W/DEVICE KIT  10/14/14  Yes Historical Provider, MD  glucose blood test strip Use as instructed 10/09/14  Yes Chelle Jeffery, PA-C  lisinopril (PRINIVIL,ZESTRIL) 20 MG tablet Take 1 tablet (20 mg total) by mouth daily.  04/27/15  Yes RLeandrew Koyanagi MD  metFORMIN (GLUCOPHAGE) 1000 MG tablet Take 1 tablet (1,000 mg total) by mouth 2 (two) times daily with a meal. 04/27/15  Yes RLeandrew Koyanagi MD  oxybutynin (DITROPAN) 5 MG tablet Take 1 tablet (5 mg total) by mouth 2 (two) times daily. 04/27/15  Yes RLeandrew Koyanagi MD  clobetasol ointment (TEMOVATE) 06.75% Apply 1 application topically 2 (two) times daily. To the lesion on the LEG. Patient not taking: Reported on 05/27/2015 01/06/15   Chelle Jeffery, PA-C  ketoconazole (NIZORAL) 2 % cream Apply 1 application topically 2 (two) times daily as needed for irritation. Patient not taking: Reported on 05/27/2015 01/06/15   Chelle Jeffery, PA-C  naproxen sodium (ANAPROX) 220 MG tablet Take 1 tablet (220 mg total) by mouth 2 (two) times daily with a meal. Patient not taking: Reported on 05/27/2015 04/22/15   CHarrison Mons PA-C   Patient Active Problem List   Diagnosis Date Noted  . Nocturia 06/10/2014  . Cataract 09/25/2012  . OSA (obstructive sleep apnea) 08/10/2012  . Eczema 08/10/2012  . Vitamin D deficiency 08/10/2012  . Erectile dysfunction 08/10/2012  . Special screening for malignant neoplasms, colon 07/17/2012  . Personal history of adenomatous colonic polyps 04/08/2012  . [place]state cancer ([place] 01/04/2012  . Diabetes type 2, controlled ([place] 10/25/2011  . HTN (hypertension) 10/25/2011  . Hyperlipidemia LDL goal <70 10/25/2011  . Morbid obesity ([place] 10/25/2011      Objective:  Physical Exam  Constitutional: He appears well-developed and well-nourished. No distress.  BP 138/86 mmHg  Pulse 81  Temp(Src) 98.4 F (36.9 C)  Resp 18  Ht _0  (1.626 m)  Wt 280 lb (127.007 kg)  BMI 48.04 kg/m2  SpO2 95%  HENT:  Head: Normocephalic and atraumatic.  Right Ear: Hearing, tympanic membrane, external ear and ear canal normal.  Left Ear: Hearing, tympanic membrane, external ear and ear canal normal.  Nose: Nose normal.  Mouth/Throat: Uvula is  midline, oropharynx is clear and moist and mucous membranes are normal.  Eyes: Conjunctivae, EOM and lids are normal. Pupils are equal, round, and reactive to light. Left eye exhibits no discharge.  Neck: Neck supple.  Cardiovascular: Normal rate, regular rhythm, S1 normal, S2 normal and normal heart sounds.  Exam reveals no gallop and no friction rub.   No murmur heard. Pulmonary/Chest: Effort normal and breath sounds normal.  Lymphadenopathy:       Head (right side): No submental, no submandibular, no tonsillar, no preauricular, no posterior auricular and no occipital adenopathy present.       Head (left side): No submental, no submandibular, no tonsillar, no preauricular, no posterior auricular and no occipital adenopathy present.    He has no cervical adenopathy.  Neurological: He is alert. He has normal strength and normal reflexes. He displays normal reflexes. No cranial nerve deficit or sensory deficit. He displays a negative Romberg sign. Coordination and gait normal.  Reflex Scores:      Bicep reflexes are 2+ on the right side and 2+ on the left side.      Brachioradialis reflexes are 2+ on the right side and 2+ on the left side.      Patellar reflexes are 2+ on the right side and 2+ on the left side.      Achilles reflexes are 2+ on the right side and 2+ on the left side. Slight nystagmus with EOM.  Able to perform "finger to nose test", "heel to shin test" wnl.  No evidence of pronator drift. Distinguished Hervey Ard and dull b/l Positive Dix-Hallpike Maneuver.  Skin: Skin is warm and dry.  Psychiatric: He has a normal mood and affect. His behavior is normal.  Vitals reviewed.     Assessment & Plan:  1. Need for Tdap vaccination - Tdap vaccine greater than or equal to 7yo IM  2. BPPV (benign paroxysmal positional vertigo), right -Provided patient with dx information and home exercises.   Return if symptoms worsen or fail to improve.

## 2015-05-27 NOTE — Progress Notes (Signed)
Patient ID: Rodney Walsh, male    DOB: 05/17/1950, 65 y.o.   MRN: [id number]  PCP: Wynne Dust  Subjective:   Chief Complaint  Patient presents with  . Dizziness    x 1 week  . Immunizations    TDAP    HPI Presents for evaluation of dizziness x 1 week. He is accompanied by his wife.  He describes the dizziness as the room spinning very fast and feeling like he is going to lose his balance. The dizziness occurs for less than 1 minute, usually around 4 seconds, and occurs with positional changes and head movements. He has taken B12 to try and relieve the symptoms, noting it has helped some but denies full relief. He denies, visual disturbances, tinnitus, syncope, weakness, or numbness.  On 05/20/15 he notes associated nausea, shaking and dry heaving with a dizzy spell but is unsure if this is related. His wife thought it was his sugar as it was relieved after he ate an orange. When he checked his sugar and BP when they returned home they were 171bpm and 131/65mHg respectivley.   [facility]. .  In addition, his daughter EJunie Panningis pregnant with her first child. She has advised him that he's not allowed to be near the baby unless he gets a Tdap, which he requests today..    Review of Systems HENT: Negative for ear discharge, ear pain and hearing loss.  Eyes: Negative for pain and visual disturbance.  Respiratory: Negative for shortness of breath.  Gastrointestinal: Positive for nausea. Vomiting: dry-heaves.  Neurological: Positive for dizziness and light-headedness. Negative for tremors, syncope, facial asymmetry, speech difficulty, weakness, numbness and headaches.     Patient Active Problem List   Diagnosis Date Noted  . Nocturia 06/10/2014  . Cataract 09/25/2012  . OSA (obstructive sleep apnea) 08/10/2012  . Eczema 08/10/2012  . Vitamin D deficiency 08/10/2012  . Erectile  dysfunction 08/10/2012  . Special screening for malignant neoplasms, colon 07/17/2012  . Personal history of adenomatous colonic polyps 04/08/2012  . [place]state cancer ([facility] 01/04/2012  . Diabetes [place] 2, controlled ([facility] 10/25/2011  . HTN (hypertension) 10/25/2011  . Hyperlipidemia LDL goal <70 10/25/2011  . Morbid obesity ([facility] [place] 10/25/2011     Prior to Admission medications   Medication Sig Start Date End Date Taking? Authorizing Provider  aspirin 81 MG tablet Take 81 mg by mouth daily.   Yes Historical Provider, MD  Blood Glucose Monitoring Suppl (ACCU-CHEK AVIVA PLUS) W/DEVICE KIT  10/14/14  Yes Historical Provider, MD  glucose blood test strip Use as instructed 10/09/14  Yes Valdis Bevill, PA-C  lisinopril (PRINIVIL,ZESTRIL) 20 MG tablet Take 1 tablet (20 mg total) by mouth daily. 04/27/15  Yes RLeandrew Koyanagi MD  metFORMIN (GLUCOPHAGE) 1000 MG tablet Take 1 tablet (1,000 mg total) by mouth 2 (two) times daily with a meal. 04/27/15  Yes RLeandrew Koyanagi MD  oxybutynin (DITROPAN) 5 MG tablet Take 1 tablet (5 mg total) by mouth 2 (two) times daily. 04/27/15  Yes RLeandrew Koyanagi MD  clobetasol ointment (TEMOVATE) 07.61% Apply 1 application topically 2 (two) times daily. To the lesion on the LEG. Patient not taking: Reported on 05/27/2015 01/06/15   Emilliano Dilworth, PA-C  ketoconazole (NIZORAL) 2 % cream Apply 1 application topically 2 (two) times daily as needed for irritation. Patient not taking: Reported on 05/27/2015 01/06/15  Analeigh Aries, PA-C  naproxen sodium (ANAPROX) 220 MG tablet Take 1 tablet (220 mg total) by mouth 2 (two) times daily with a meal. Patient not taking: Reported on 05/27/2015 04/22/15   Vertis Scheib, PA-C     No Known Allergies     Objective:  Physical Exam  Constitutional: He is oriented to person, place, and time. Vital signs are normal. He appears well-developed and well-nourished. He is active and cooperative. No distress.  BP 138/86 mmHg  Pulse  81  Temp(Src) 98.4 F (36.9 C)  Resp 18  Ht 5' 4" (1.626 m)  Wt 280 lb (127.007 kg)  BMI 48.04 kg/m2  SpO2 95%   HENT:  Head: Normocephalic and atraumatic.  Right Ear: Hearing, tympanic membrane, external ear and ear canal normal.  Left Ear: Hearing, tympanic membrane, external ear and ear canal normal.  Nose: Nose normal.  Mouth/Throat: Uvula is midline, oropharynx is clear and moist and mucous membranes are normal. No oral lesions.  Eyes: Conjunctivae and lids are normal. Pupils are equal, round, and reactive to light. Right eye exhibits no discharge and no exudate. Left eye exhibits no discharge and no exudate. No scleral icterus. Right eye exhibits nystagmus (2-3 beats with RIGHTward gaze).  Fundoscopic exam:      The right eye shows no hemorrhage and no papilledema. The right eye shows red reflex.       The left eye shows no hemorrhage and no papilledema. The left eye shows red reflex.  Neck: Normal range of motion, full passive range of motion without pain and phonation normal. Neck supple. No thyromegaly present.  Cardiovascular: Normal rate, regular rhythm and normal heart sounds.   Pulses:      Radial pulses are 2+ on the right side, and 2+ on the left side.  Pulmonary/Chest: Effort normal and breath sounds normal.  Abdominal: Normal appearance and bowel sounds are normal. There is no hepatosplenomegaly. There is no tenderness.  Lymphadenopathy:       Head (right side): No tonsillar, no preauricular, no posterior auricular and no occipital adenopathy present.       Head (left side): No tonsillar, no preauricular, no posterior auricular and no occipital adenopathy present.    He has no cervical adenopathy.       Right: No supraclavicular adenopathy present.       Left: No supraclavicular adenopathy present.  Neurological: He is alert and oriented to person, place, and time. He has normal strength and normal reflexes. No cranial nerve deficit or sensory deficit. He displays a  negative Romberg sign. Coordination and gait normal.  Positive Dix-Hallpike.  Modified-Epley maneuver performed bilaterally, 4 cycles, with diminishing and then resolution of nystagmus and symptoms.  Skin: Skin is warm, dry and intact. No rash noted. No cyanosis or erythema. Nails show no clubbing.  Psychiatric: He has a normal mood and affect. His speech is normal and behavior is normal.           Assessment & Plan:   1. Need for Tdap vaccination - Tdap vaccine greater than or equal to 7yo IM  2. BPPV (benign paroxysmal positional vertigo), right Home with instructions on performing modified-Epley daily until resolution of his symptoms. Encouraged healthy eating and adequate oral hydration. RTC if symptoms worsen/persist.   Fara Chute, PA-C Physician Assistant-Certified [facility]

## 2015-05-27 NOTE — Patient Instructions (Signed)
Benign Positional Vertigo Vertigo is the feeling that you or your surroundings are moving when they are not. Benign positional vertigo is the most common form of vertigo. The cause of this condition is not serious (is benign). This condition is triggered by certain movements and positions (is positional). This condition can be dangerous if it occurs while you are doing something that could endanger you or others, such as driving.  CAUSES In many cases, the cause of this condition is not known. It may be caused by a disturbance in an area of the inner ear that helps your brain to sense movement and balance. This disturbance can be caused by a viral infection (labyrinthitis), head injury, or repetitive motion. RISK FACTORS This condition is more likely to develop in:  Women.  People who are 50 years of age or older. SYMPTOMS Symptoms of this condition usually happen when you move your head or your eyes in different directions. Symptoms may start suddenly, and they usually last for less than a minute. Symptoms may include:  Loss of balance and falling.  Feeling like you are spinning or moving.  Feeling like your surroundings are spinning or moving.  Nausea and vomiting.  Blurred vision.  Dizziness.  Involuntary eye movement (nystagmus). Symptoms can be mild and cause only slight annoyance, or they can be severe and interfere with daily life. Episodes of benign positional vertigo may return (recur) over time, and they may be triggered by certain movements. Symptoms may improve over time. DIAGNOSIS This condition is usually diagnosed by medical history and a physical exam of the head, neck, and ears. You may be referred to a health care provider who specializes in ear, nose, and throat (ENT) problems (otolaryngologist) or a provider who specializes in disorders of the nervous system (neurologist). You may have additional testing, including:  MRI.  A CT scan.  Eye movement tests. Your  health care provider may ask you to change positions quickly while he or she watches you for symptoms of benign positional vertigo, such as nystagmus. Eye movement may be tested with an electronystagmogram (ENG), caloric stimulation, the Dix-Hallpike test, or the roll test.  An electroencephalogram (EEG). This records electrical activity in your brain.  Hearing tests. TREATMENT Usually, your health care provider will treat this by moving your head in specific positions to adjust your inner ear back to normal. Surgery may be needed in severe cases, but this is rare. In some cases, benign positional vertigo may resolve on its own in 2-4 weeks. HOME CARE INSTRUCTIONS Safety  Move slowly.Avoid sudden body or head movements.  Avoid driving.  Avoid operating heavy machinery.  Avoid doing any tasks that would be dangerous to you or others if a vertigo episode would occur.  If you have trouble walking or keeping your balance, try using a cane for stability. If you feel dizzy or unstable, sit down right away.  Return to your normal activities as told by your health care provider. Ask your health care provider what activities are safe for you. General Instructions  Take over-the-counter and prescription medicines only as told by your health care provider.  Avoid certain positions or movements as told by your health care provider.  Drink enough fluid to keep your urine clear or pale yellow.  Keep all follow-up visits as told by your health care provider. This is important. SEEK MEDICAL CARE IF:  You have a fever.  Your condition gets worse or you develop new symptoms.  Your family or friends   notice any behavioral changes.  Your nausea or vomiting gets worse.  You have numbness or a "pins and needles" sensation. SEEK IMMEDIATE MEDICAL CARE IF:  You have difficulty speaking or moving.  You are always dizzy.  You faint.  You develop severe headaches.  You have weakness in your  legs or arms.  You have changes in your hearing or vision.  You develop a stiff neck.  You develop sensitivity to light.   This information is not intended to replace advice given to you by your health care provider. Make sure you discuss any questions you have with your health care provider.   Document Released: 01/31/2006 Document Revised: 01/14/2015 Document Reviewed: 08/18/2014 Elsevier Interactive Patient Education 2016 [employer]  

## 2015-05-30 NOTE — Progress Notes (Signed)
  Medical screening examination/treatment/procedure(s) were performed by non-physician practitioner and as supervising physician I was immediately available for consultation/collaboration.     

## 2015-06-18 ENCOUNTER — Encounter: Payer: Self-pay | Admitting: Physician Assistant

## 2015-07-06 ENCOUNTER — Encounter: Payer: Self-pay | Admitting: Physician Assistant

## 2015-07-14 ENCOUNTER — Encounter: Payer: Self-pay | Admitting: Physician Assistant

## 2015-07-14 ENCOUNTER — Ambulatory Visit (INDEPENDENT_AMBULATORY_CARE_PROVIDER_SITE_OTHER): Payer: Medicare Other | Admitting: Physician Assistant

## 2015-07-14 VITALS — BP 135/85 | HR 108 | Temp 98.5°F | Resp 16 | Ht 63.5 in | Wt 276.8 lb

## 2015-07-14 DIAGNOSIS — E559 Vitamin D deficiency, unspecified: Secondary | ICD-10-CM

## 2015-07-14 DIAGNOSIS — I10 Essential (primary) hypertension: Secondary | ICD-10-CM | POA: Diagnosis not present

## 2015-07-14 DIAGNOSIS — E785 Hyperlipidemia, unspecified: Secondary | ICD-10-CM

## 2015-07-14 DIAGNOSIS — R059 Cough, unspecified: Secondary | ICD-10-CM

## 2015-07-14 DIAGNOSIS — E119 Type 2 diabetes mellitus without complications: Secondary | ICD-10-CM | POA: Diagnosis not present

## 2015-07-14 DIAGNOSIS — G4733 Obstructive sleep apnea (adult) (pediatric): Secondary | ICD-10-CM

## 2015-07-14 DIAGNOSIS — R05 Cough: Secondary | ICD-10-CM

## 2015-07-14 LAB — COMPREHENSIVE METABOLIC PANEL
ALT: 22 U/L (ref 9–46)
AST: 17 U/L (ref 10–35)
Albumin: 4.3 g/dL (ref 3.6–5.1)
Alkaline Phosphatase: 60 U/L (ref 40–115)
BUN: 13 mg/dL (ref 7–25)
CHLORIDE: 98 mmol/L (ref 98–110)
CO2: 25 mmol/L (ref 20–31)
CREATININE: 1.07 mg/dL (ref 0.70–1.25)
Calcium: 9.6 mg/dL (ref 8.6–10.3)
GLUCOSE: 155 mg/dL — AB (ref 65–99)
Potassium: 4.8 mmol/L (ref 3.5–5.3)
Sodium: 134 mmol/L — ABNORMAL LOW (ref 135–146)
Total Bilirubin: 0.4 mg/dL (ref 0.2–1.2)
Total Protein: 7.2 g/dL (ref 6.1–8.1)

## 2015-07-14 LAB — HEMOGLOBIN A1C
HEMOGLOBIN A1C: 6.5 % — AB (ref <5.7)
MEAN PLASMA GLUCOSE: 140 mg/dL — AB (ref <117)

## 2015-07-14 MED ORDER — BENZONATATE 100 MG PO CAPS: 100.0000 mg | ORAL_CAPSULE | Freq: Two times a day (BID) | ORAL | Status: DC | PRN

## 2015-07-14 NOTE — Progress Notes (Signed)
Patient ID: Rodney Walsh, male    DOB: 1951-04-23, 65 y.o.   MRN: [id number]  PCP: Wynne Dust  Subjective:   Chief Complaint  Patient presents with  . Follow-up    Diabetes    HPI Presents for evaluation of diabetes and complaint of "cold symptoms" x6 days.   Was diagnosed with DM about 8 years ago. Is currently taking 1060m of metformin daily. Was taking 10065mBID but was having diarrhea so cut back the dose. Checks his blood sugars twice a week, has been running 120-145. Was getting up every 2 hours to urinate but hasn't needed to lately.   Is working to improve his diet. Currently eats a bagel for breakfast, sandwich for lunch, and a usually a baked protein for dinner. Likes to eat 3-4 snacks a day of crackers, "a lot of oranges", and some times potato chips. Has been to diabetes nutrition classes. "I know what to do, it's just doing it."  States he gets an hour of exercise a week from walking. States he only gets an hour because he is "lazy." Used to have a health partner(through his insurance plan) that would call and set goals with him. Says he will increase his exercise when it gets warmer.   His last eye exam was in 2015. Is planning to get his eyes checked this month, has been difficult due to financial restraints. States he checks his feet regularly. Denies any numbness, pins and needles sensation, or abnormal sensations.   Was diagnosed with HTN many years ago. Is currently taking lisinopril 2056maily. Usually in 120-130/80 and never getting over 150/80. Watches his salt intake. Denies headaches, vision changes.    Has OSA with cpap but states "it's been a long time since I used that." Complains that it is old and that it makes it hard to swallow. States he does have energy in the morning if he gets up right away. Doesn't want to use the machine.   His cold started Thursday 07/09/15 with watery eyes, runny nose, congestion, and some sinus pressure.  States that his symptoms but the cough have mostly resolved. Also complains a non productive cough. Denies SOB, CP, or ear pain. Has taken Nyquil, vitamin C, and OTC congestion pills with some relief. Would like to get some tessalon perles to help with the cough.    Review of Systems Constitutional: Positive for fever, chills and fatigue.  HENT: Positive for congestion, rhinorrhea and sinus pressure. Negative for hearing loss.  Eyes: Positive for discharge (watery). Negative for visual disturbance.  Respiratory: Positive for choking. Negative for shortness of breath.  Cardiovascular: Negative for chest pain.  Gastrointestinal: Negative for nausea, vomiting, abdominal pain and diarrhea.  Genitourinary: Negative for dysuria and frequency.  Neurological: Negative for dizziness, light-headedness, numbness and headaches.     Patient Active Problem List   Diagnosis Date Noted  . Nocturia 06/10/2014  . Cataract 09/25/2012  . OSA (obstructive sleep apnea) 08/10/2012  . Eczema 08/10/2012  . Vitamin D deficiency 08/10/2012  . Erectile dysfunction 08/10/2012  . Special screening for malignant neoplasms, colon 07/17/2012  . Personal history of adenomatous colonic polyps 04/08/2012  . [place] ([place] [place]/[place]2013  . Diabetes type 2, controlled ([facility]6/18/2013  . HTN (hypertension) 10/25/2011  . Hyperlipidemia LDL goal <70 10/25/2011  . Morbid obesity ([facility]/18/2013     Prior to Admission medications   Medication Sig Start Date End Date Taking? Authorizing Provider  aspirin 81 MG tablet Take 81  mg by mouth daily.   Yes Historical Provider, MD  clobetasol ointment (TEMOVATE) 3.97 % Apply 1 application topically 2 (two) times daily. To the lesion on the LEG. 01/06/15  Yes Jolean Madariaga, PA-C  lisinopril (PRINIVIL,ZESTRIL) 20 MG tablet Take 1 tablet (20 mg total) by mouth daily. 04/27/15  Yes Leandrew Koyanagi, MD  metFORMIN (GLUCOPHAGE) 1000 MG tablet Take 1 tablet (1,000 mg total)  by mouth 2 (two) times daily with a meal. 04/27/15  Yes Leandrew Koyanagi, MD  Blood Glucose Monitoring Suppl (ACCU-CHEK AVIVA PLUS) W/DEVICE KIT  10/14/14   Historical Provider, MD  glucose blood test strip Use as instructed 10/09/14   Donnae Michels, PA-C  ketoconazole (NIZORAL) 2 % cream Apply 1 application topically 2 (two) times daily as needed for irritation. Patient not taking: Reported on 05/27/2015 01/06/15   Fremon Zacharia, PA-C  naproxen sodium (ANAPROX) 220 MG tablet Take 1 tablet (220 mg total) by mouth 2 (two) times daily with a meal. Patient not taking: Reported on 05/27/2015 04/22/15   Akashdeep Chuba, PA-C  oxybutynin (DITROPAN) 5 MG tablet Take 1 tablet (5 mg total) by mouth 2 (two) times daily. Patient not taking: Reported on 07/14/2015 04/27/15   Leandrew Koyanagi, MD     No Known Allergies     Objective:  Physical Exam  Constitutional: He is oriented to person, place, and time. He appears well-developed and well-nourished. He is active and cooperative. No distress.  BP 135/85 mmHg  Pulse 108  Temp(Src) 98.5 F (36.9 C) (Oral)  Resp 16  Ht 5' 3.5" (1.613 m)  Wt 276 lb 12.8 oz (125.556 kg)  BMI 48.26 kg/m2  SpO2 94%  HENT:  Head: Normocephalic and atraumatic.  Right Ear: Hearing normal.  Left Ear: Hearing normal.  Eyes: Conjunctivae are normal. No scleral icterus.  Neck: Normal range of motion. Neck supple. No thyromegaly present.  Cardiovascular: Normal rate, regular rhythm and normal heart sounds.   Pulses:      Radial pulses are 2+ on the right side, and 2+ on the left side.  Pulmonary/Chest: Effort normal and breath sounds normal.  Lymphadenopathy:       Head (right side): No tonsillar, no preauricular, no posterior auricular and no occipital adenopathy present.       Head (left side): No tonsillar, no preauricular, no posterior auricular and no occipital adenopathy present.    He has no cervical adenopathy.       Right: No supraclavicular adenopathy present.         Left: No supraclavicular adenopathy present.  Neurological: He is alert and oriented to person, place, and time. No sensory deficit.  Skin: Skin is warm, dry and intact. No rash noted. No cyanosis or erythema. Nails show no clubbing.  Psychiatric: He has a normal mood and affect. His speech is normal and behavior is normal.           Assessment & Plan:   1. Controlled type 2 diabetes mellitus without complication, without long-term current use of insulin ([place]) Await A1C results. Encouraged healthier eating choices and regular exercise. Reminded him that not doing these things increases his risk of a cardiovascular event. - Hemoglobin A1c - Comprehensive metabolic panel  2. Essential hypertension Controlled. Continue current treatment.  - Comprehensive metabolic panel  3. OSA (obstructive sleep apnea) Encouraged him to contact the supplier of his CPAP set up. There are a variety of options for mask/nasal pillows, hydrated air, etc that may make it more comfortable.  Reminded him that uncontrolled OSA increases his risk for a cardiovascular event, raises BP, etc.  4. Hyperlipidemia LDL goal <70 Normal last check. Not updated today.  5. Vitamin D deficiency Update level. May need supplementation. - VITAMIN D 25 Hydroxy (Vit-D Deficiency, Fractures)  6. Cough Viral URI, resolving, with persistent cough. Supportive care. - benzonatate (TESSALON) 100 MG capsule; Take 1 capsule (100 mg total) by mouth 2 (two) times daily as needed for cough.  Dispense: 20 capsule; Refill: 0   Return in about 3 months (around 10/14/2015).   Fara Chute, PA-C Physician Assistant-Certified [facility]

## 2015-07-14 NOTE — Progress Notes (Signed)
Subjective:    Patient ID: Rodney Walsh, male    DOB: 1951/03/01, 65 y.o.   MRN: [id number] Chief Complaint  Patient presents with  . Follow-up    Diabetes    HPI  Presents today for a diabetes follow up and a complaint of "cold symptoms" x6 days.   Was diagnosed with DM about 8  years ago. Is currently taking 1070m of metformin daily. Was taking 10077mBID but was having diarrhea so cut back the dose. Checks his blood sugars twice a week, has been running 120-145. Was getting up every 2 hours to urinate but hasn't needed to lately.   Is working to improve his diet. Currently eats a bagel for breakfast, sandwich for lunch, and a usually a baked protein for dinner. Likes to eat 3-4 snacks a day of crackers, "a lot of oranges", and some times potato chips. Has been to diabetes nutrition classes. "I know what to do, it's just doing it."  States he gets an hour of exercise a week from walking. States he only gets an hour because he is "lazy." Used to have a health partner that would call and set goals with him. Says he will increase his exercise when it gets warmer.   His last eye exam was in 2015. Is planning to get his eyes checked this month, has been difficult due to financial restraints. States he checks his feet regularly. Denies any numbness, pins and needles sensation, or abnormal sensations.   Was diagnosed with HTN many years ago. Is currently taking lisinopril 2031maily. Usually in 120-130/80 and never getting over 150/80. Watches his salt intake. Denies headaches, vision changes.      Has OSA with cpap but states "it's been a long time since I used that." Complains that it is old and that it makes it hard to swallow. States he does have energy in the morning if he gets up right away. Doesn't want to use the machine.   His cold started Thursday 07/09/15 with watery eyes, runny nose, congestion, and some sinus pressure. States that his symptoms but the cough have mostly resolved.   Also complains a non productive cough. Denies SOB, CP, or ear pain. Has taken Nyquil, vitamin C, and OTC congestion pills with some relief.  Would like to get some tessalon perles to help with the cough.   PMH, FH, and SH were all reviewed with patient and updated as needed.  No Known Allergies   Prior to Admission medications   Medication Sig Start Date End Date Taking? Authorizing Provider  aspirin 81 MG tablet Take 81 mg by mouth daily.   Yes Historical Provider, MD  clobetasol ointment (TEMOVATE) 0.06.19Apply 1 application topically 2 (two) times daily. To the lesion on the LEG. 01/06/15  Yes Chelle Jeffery, PA-C  lisinopril (PRINIVIL,ZESTRIL) 20 MG tablet Take 1 tablet (20 mg total) by mouth daily. 04/27/15  Yes RobLeandrew KoyanagiD  metFORMIN (GLUCOPHAGE) 1000 MG tablet Take 1 tablet (1,000 mg total) by mouth 2 (two) times daily with a meal. 04/27/15  Yes RobLeandrew KoyanagiD  Blood Glucose Monitoring Suppl (ACCU-CHEK AVIVA PLUS) W/DEVICE KIT  10/14/14   Historical Provider, MD  glucose blood test strip Use as instructed 10/09/14   Chelle Jeffery, PA-C  ketoconazole (NIZORAL) 2 % cream Apply 1 application topically 2 (two) times daily as needed for irritation. Patient not taking: Reported on 05/27/2015 01/06/15   Chelle Jeffery, PA-C  naproxen sodium (ANAPROX) 220  MG tablet Take 1 tablet (220 mg total) by mouth 2 (two) times daily with a meal. Patient not taking: Reported on 05/27/2015 04/22/15   Chelle Jeffery, PA-C  oxybutynin (DITROPAN) 5 MG tablet Take 1 tablet (5 mg total) by mouth 2 (two) times daily. Patient not taking: Reported on 07/14/2015 04/27/15   Leandrew Koyanagi, MD    Review of Systems  Constitutional: Positive for fever, chills and fatigue.  HENT: Positive for congestion, rhinorrhea and sinus pressure. Negative for hearing loss.   Eyes: Positive for discharge (watery). Negative for visual disturbance.  Respiratory: Positive for choking. Negative for shortness of breath.     Cardiovascular: Negative for chest pain.  Gastrointestinal: Negative for nausea, vomiting, abdominal pain and diarrhea.  Genitourinary: Negative for dysuria and frequency.  Neurological: Negative for dizziness, light-headedness, numbness and headaches.       Objective:   Physical Exam  Constitutional: He is oriented to person, place, and time. He appears well-developed and well-nourished. No distress.  Blood pressure 135/85, pulse 108, temperature 98.5 F (36.9 C), temperature source Oral, resp. rate 16, height 5' 3.5" (1.613 m), weight 276 lb 12.8 oz (125.556 kg), SpO2 94 %.   HENT:  Head: Normocephalic and atraumatic.  Right Ear: Hearing, tympanic membrane, external ear and ear canal normal.  Left Ear: Hearing, tympanic membrane, external ear and ear canal normal.  Nose: Nose normal.  Mouth/Throat: Oropharynx is clear and moist. No oropharyngeal exudate.  Eyes: Conjunctivae and EOM are normal. Pupils are equal, round, and reactive to light. Right eye exhibits discharge (watery). Left eye exhibits discharge (watery).  Neck: Normal range of motion. Neck supple.  Cardiovascular: Normal rate, regular rhythm, S1 normal, S2 normal, normal heart sounds and intact distal pulses.  Exam reveals no gallop and no friction rub.   No murmur heard. Pulses:      Radial pulses are 2+ on the right side, and 2+ on the left side.       Dorsalis pedis pulses are 2+ on the right side, and 2+ on the left side.       Posterior tibial pulses are 2+ on the right side, and 2+ on the left side.  Pulmonary/Chest: Effort normal and breath sounds normal. No respiratory distress. He has no wheezes. He has no rales.  Lymphadenopathy:       Head (right side): No submental, no submandibular, no tonsillar, no preauricular, no posterior auricular and no occipital adenopathy present.       Head (left side): No submental, no submandibular, no tonsillar, no preauricular, no posterior auricular and no occipital adenopathy  present.    He has no cervical adenopathy.  Neurological: He is alert and oriented to person, place, and time. He has normal strength. No sensory deficit.  Reflex Scores:      Bicep reflexes are 2+ on the right side and 2+ on the left side.      Brachioradialis reflexes are 2+ on the right side.      Patellar reflexes are 2+ on the right side and 2+ on the left side.      Achilles reflexes are 2+ on the right side and 2+ on the left side. Skin: Skin is warm and dry.  Psychiatric: He has a normal mood and affect. His speech is normal and behavior is normal. Judgment and thought content normal.   Results for orders placed or performed in visit on 07/14/15  Hemoglobin A1c  Result Value Ref Range   Hgb A1c MFr  Bld 6.5 (H) <5.7 %   Mean Plasma Glucose 140 (H) <117 mg/dL  Comprehensive metabolic panel  Result Value Ref Range   Sodium 134 (L) 135 - 146 mmol/L   Potassium 4.8 3.5 - 5.3 mmol/L   Chloride 98 98 - 110 mmol/L   CO2 25 20 - 31 mmol/L   Glucose, Bld 155 (H) 65 - 99 mg/dL   BUN 13 7 - 25 mg/dL   Creat 1.07 0.70 - 1.25 mg/dL   Total Bilirubin 0.4 0.2 - 1.2 mg/dL   Alkaline Phosphatase 60 40 - 115 U/L   AST 17 10 - 35 U/L   ALT 22 9 - 46 U/L   Total Protein 7.2 6.1 - 8.1 g/dL   Albumin 4.3 3.6 - 5.1 g/dL   Calcium 9.6 8.6 - 10.3 mg/dL  VITAMIN D 25 Hydroxy (Vit-D Deficiency, Fractures)  Result Value Ref Range   Vit D, 25-Hydroxy  30 - 100 ng/mL       Assessment & Plan:  1. Controlled type 2 diabetes mellitus without complication, without long-term current use of insulin ([place]) Will get labs.  - Hemoglobin A1c - Comprehensive metabolic panel  2. Essential hypertension Continue lisinopril 45m.  - Comprehensive metabolic panel  3. OSA (obstructive sleep apnea) Encourage to use cpap.   4. Hyperlipidemia LDL goal <70  5. Vitamin D deficiency Will check Vit D level.  - VITAMIN D 25 Hydroxy (Vit-D Deficiency, Fractures)  6. Cough Will prescribe tesalon perles to  help with cough.  - benzonatate (TESSALON) 100 MG capsule; Take 1 capsule (100 mg total) by mouth 2 (two) times daily as needed for cough.  Dispense: 20 capsule; Refill: 0  Encouraged patient to continue working on eating healthy and try to increase exercise to 153m/week. Encouraged patient get an eye exam.   Discussed assessment and plan with patient. They expressed their understanding and acceptance.   S.Maryjean Ka[employer]\

## 2015-07-14 NOTE — Patient Instructions (Signed)
Because you received labwork today, you will receive an invoice from [employer]. Please contact [facility] at [phone] with questions or concerns regarding your invoice. Our billing staff will not be able to assist you with those questions.  You will be contacted with the lab results as soon as they are available. The fastest way to get your results is to activate your My Chart account. Instructions are located on the last page of this paperwork. If you have not heard from [place] regarding the results in 2 weeks, please contact this office.  Please call the service number on the CPAP about getting it serviced. Using the CPAP will help your blood pressure, your weight and your diabetes control.  Please schedule an eye exam.

## 2015-07-15 LAB — VITAMIN D 25 HYDROXY (VIT D DEFICIENCY, FRACTURES): Vit D, 25-Hydroxy: 11 ng/mL — ABNORMAL LOW (ref 30–100)

## 2015-07-18 MED ORDER — VITAMIN D (ERGOCALCIFEROL) 1.25 MG (50000 UNIT) PO CAPS: 50000.0000 [IU] | ORAL_CAPSULE | ORAL | Status: DC

## 2015-07-18 NOTE — Addendum Note (Signed)
Addended by: Fara Chute on: 07/18/2015 08:31 AM   Modules accepted: Orders

## 2015-07-23 ENCOUNTER — Encounter: Payer: Self-pay | Admitting: Physician Assistant

## 2015-07-24 ENCOUNTER — Encounter: Payer: Self-pay | Admitting: Physician Assistant

## 2015-08-22 ENCOUNTER — Other Ambulatory Visit: Payer: Self-pay | Admitting: Physician Assistant

## 2015-08-25 ENCOUNTER — Encounter: Payer: Self-pay | Admitting: Physician Assistant

## 2015-08-25 DIAGNOSIS — R351 Nocturia: Secondary | ICD-10-CM

## 2015-08-26 MED ORDER — OXYBUTYNIN CHLORIDE 5 MG PO TABS: 5.0000 mg | ORAL_TABLET | Freq: Two times a day (BID) | ORAL | Status: DC

## 2015-09-11 ENCOUNTER — Encounter: Payer: Self-pay | Admitting: Physician Assistant

## 2015-09-14 MED ORDER — CYCLOBENZAPRINE HCL 10 MG PO TABS: 10.0000 mg | ORAL_TABLET | Freq: Three times a day (TID) | ORAL | Status: DC | PRN

## 2015-09-28 DIAGNOSIS — E11319 Type 2 diabetes mellitus with unspecified diabetic retinopathy without macular edema: Secondary | ICD-10-CM | POA: Diagnosis not present

## 2015-09-28 LAB — HM DIABETES EYE EXAM

## 2015-10-04 ENCOUNTER — Encounter: Payer: Self-pay | Admitting: Physician Assistant

## 2015-10-04 DIAGNOSIS — E113219 Type 2 diabetes mellitus with mild nonproliferative diabetic retinopathy with macular edema, unspecified eye: Secondary | ICD-10-CM

## 2015-10-04 DIAGNOSIS — E11319 Type 2 diabetes mellitus with unspecified diabetic retinopathy without macular edema: Secondary | ICD-10-CM | POA: Insufficient documentation

## 2015-10-04 DIAGNOSIS — E13311 Other specified diabetes mellitus with unspecified diabetic retinopathy with macular edema: Secondary | ICD-10-CM

## 2015-10-13 ENCOUNTER — Telehealth: Payer: Self-pay

## 2015-10-13 ENCOUNTER — Ambulatory Visit (INDEPENDENT_AMBULATORY_CARE_PROVIDER_SITE_OTHER): Payer: Medicare Other | Admitting: Physician Assistant

## 2015-10-13 ENCOUNTER — Ambulatory Visit (INDEPENDENT_AMBULATORY_CARE_PROVIDER_SITE_OTHER): Payer: Medicare Other

## 2015-10-13 ENCOUNTER — Encounter: Payer: Self-pay | Admitting: Physician Assistant

## 2015-10-13 ENCOUNTER — Telehealth: Payer: Self-pay | Admitting: *Deleted

## 2015-10-13 VITALS — BP 128/90 | HR 76 | Temp 98.4°F | Resp 16 | Ht 63.25 in | Wt 278.2 lb

## 2015-10-13 DIAGNOSIS — R1031 Right lower quadrant pain: Secondary | ICD-10-CM

## 2015-10-13 DIAGNOSIS — R351 Nocturia: Secondary | ICD-10-CM | POA: Diagnosis not present

## 2015-10-13 DIAGNOSIS — L309 Dermatitis, unspecified: Secondary | ICD-10-CM

## 2015-10-13 DIAGNOSIS — E785 Hyperlipidemia, unspecified: Secondary | ICD-10-CM | POA: Diagnosis not present

## 2015-10-13 DIAGNOSIS — I10 Essential (primary) hypertension: Secondary | ICD-10-CM

## 2015-10-13 DIAGNOSIS — E113219 Type 2 diabetes mellitus with mild nonproliferative diabetic retinopathy with macular edema, unspecified eye: Secondary | ICD-10-CM

## 2015-10-13 DIAGNOSIS — E119 Type 2 diabetes mellitus without complications: Secondary | ICD-10-CM | POA: Diagnosis not present

## 2015-10-13 DIAGNOSIS — G4733 Obstructive sleep apnea (adult) (pediatric): Secondary | ICD-10-CM | POA: Diagnosis not present

## 2015-10-13 DIAGNOSIS — M1611 Unilateral primary osteoarthritis, right hip: Secondary | ICD-10-CM | POA: Diagnosis not present

## 2015-10-13 DIAGNOSIS — E559 Vitamin D deficiency, unspecified: Secondary | ICD-10-CM

## 2015-10-13 DIAGNOSIS — M5136 Other intervertebral disc degeneration, lumbar region: Secondary | ICD-10-CM | POA: Diagnosis not present

## 2015-10-13 DIAGNOSIS — M47816 Spondylosis without myelopathy or radiculopathy, lumbar region: Secondary | ICD-10-CM | POA: Diagnosis not present

## 2015-10-13 LAB — COMPREHENSIVE METABOLIC PANEL
ALT: 15 U/L (ref 9–46)
AST: 15 U/L (ref 10–35)
Albumin: 4.3 g/dL (ref 3.6–5.1)
Alkaline Phosphatase: 61 U/L (ref 40–115)
BUN: 11 mg/dL (ref 7–25)
CALCIUM: 9.2 mg/dL (ref 8.6–10.3)
CHLORIDE: 103 mmol/L (ref 98–110)
CO2: 20 mmol/L (ref 20–31)
Creat: 0.92 mg/dL (ref 0.70–1.25)
GLUCOSE: 107 mg/dL — AB (ref 65–99)
POTASSIUM: 4.6 mmol/L (ref 3.5–5.3)
Sodium: 135 mmol/L (ref 135–146)
Total Bilirubin: 0.4 mg/dL (ref 0.2–1.2)
Total Protein: 7.1 g/dL (ref 6.1–8.1)

## 2015-10-13 LAB — HEMOGLOBIN A1C
Hgb A1c MFr Bld: 7.3 % — ABNORMAL HIGH (ref <5.7)
Mean Plasma Glucose: 163 mg/dL

## 2015-10-13 MED ORDER — NAPROXEN SODIUM 220 MG PO TABS: 220.0000 mg | ORAL_TABLET | Freq: Two times a day (BID) | ORAL | Status: DC

## 2015-10-13 NOTE — Patient Instructions (Signed)
     IF you received an x-ray today, you will receive an invoice from [facility]. Please contact [facility] at [phone] with questions or concerns regarding your invoice.   IF you received labwork today, you will receive an invoice from [facility]. Please contact [facility] at [phone] with questions or concerns regarding your invoice.   Our billing staff will not be able to assist you with questions regarding bills from these companies.  You will be contacted with the lab results as soon as they are available. The fastest way to get your results is to activate your My Chart account. Instructions are located on the last page of this paperwork. If you have not heard from us regarding the results in 2 weeks, please contact this office.      

## 2015-10-13 NOTE — Telephone Encounter (Signed)
Heather from [employer] on [address], called to verify for the patient that he should not take Advil while taking Naproxen sodium(Anaprox) 220 mg tablet, 1 tablet by mouth 2 times daily with a meal, Qty 180. Per Chelle that is correct.

## 2015-10-13 NOTE — Progress Notes (Signed)
Patient ID: Rodney Walsh, male    DOB: May 12, 1950, 65 y.o.   MRN: [id number]  PCP: [facility]  Subjective:   Chief Complaint  Patient presents with  . Follow-up  . Diabetes  . right leg pain    up into the right side of groin x 4 wks    HPI Presents for evaluation of diabetes, HTN and hyperlipidemia, and also reporting RIGHT groin pain radiating into the RIGHT leg x 4 weeks.  Tolerating medications without difficulty. Continues off most of the previously prescribed medications. Little exercise. Has added some fruit and veggies to his diet, but not cut back on sweets, carbs, etc. Home glucose monitoring about 1/week, last check was 6/04 and was 152.  RIGHT groin pain x4 weeks, "like a knife in there turned the wrong way that goes down the back of my leg to my knee, and sometimes after sitting for a while it goes down to my toes."  He first noticed in one morning when getting out of bed, exacerbated by sitting on hard surface, and when transitioning to standing, has to "ease myself up to prevent pain."  Flexeril relieved pain for a short period of time but is no longer working. Less painful after BM, but does not get increased pain with urge to defecate. No LBP, though he has a history of RIGHT sided LBP. No numbness/tingling. No weakness in the leg. No saddle anesthesia. No loss of B/B control, and no urinary changes.  Tooth ache - x 1 week - no dentist, and wonders if the tooth problem is related to the pain in the groin and leg. Constipation x1 wk, BM yesterday, just been more difficult for he last week, requiring straining. No abdominal pain, nausea, vomiting, melena, hematochezia.  He is very much enjoying his newest grandchild, Oswaldo Milian, who is almost 66 months old.   Review of Systems  Constitutional: Negative.   HENT: Negative.   Eyes: Negative.   Respiratory: Negative for apnea, cough, choking, chest tightness, shortness of breath, wheezing and stridor.          Not using CPAP due to dry mouth  Cardiovascular: Negative.   Gastrointestinal: Positive for constipation. Negative for nausea, vomiting, abdominal pain, diarrhea, blood in stool, abdominal distention, anal bleeding and rectal pain.  Endocrine: Negative.   Genitourinary: Negative.  Negative for dysuria, urgency, frequency, hematuria, penile swelling, scrotal swelling, difficulty urinating, penile pain and testicular pain.       Nocturia, Q2 hours  Musculoskeletal: Negative for back pain, joint swelling, gait problem, neck pain and neck stiffness.       RIGHT leg pain extends from groin to foot  Skin: Negative.   Allergic/Immunologic: Negative.   Neurological: Negative.   Hematological: Negative.   Psychiatric/Behavioral: Negative.        Patient Active Problem List   Diagnosis Date Noted  . Macular edema due to secondary diabetes ([place]) 10/04/2015  . Diabetic retinopathy ([place]) 10/04/2015  . Nocturia 06/10/2014  . Cataract 09/25/2012  . OSA (obstructive sleep apnea) 08/10/2012  . Eczema 08/10/2012  . Vitamin D deficiency 08/10/2012  . Erectile dysfunction 08/10/2012  . Special screening for malignant neoplasms, colon 07/17/2012  . Personal history of adenomatous colonic polyps 04/08/2012  . Prostate cancer ([place]) 01/04/2012  . Diabetes type 2, controlled ([place]) 10/25/2011  . HTN (hypertension) 10/25/2011  . Hyperlipidemia LDL goal <70 10/25/2011  . Morbid obesity ([place]) 10/25/2011     Prior to Admission medications   Medication Sig Start  Date End Date Taking? Authorizing Provider  aspirin 81 MG tablet Take 81 mg by mouth daily.   Yes Historical Provider, MD  Blood Glucose Monitoring Suppl (ACCU-CHEK AVIVA PLUS) W/DEVICE KIT  10/14/14  Yes Historical Provider, MD  glucose blood test strip Use as instructed 10/09/14  Yes Laurel Smeltz, PA-C  metFORMIN (GLUCOPHAGE) 1000 MG tablet Take 1 tablet (1,000 mg total) by mouth 2 (two) times daily with a meal. 04/27/15  Yes Leandrew Koyanagi, MD  clobetasol ointment (TEMOVATE) 9.56 % Apply 1 application topically 2 (two) times daily. To the lesion on the LEG. Patient not taking: Reported on 10/13/2015 01/06/15   Sonya Gunnoe, PA-C  cyclobenzaprine (FLEXERIL) 10 MG tablet Take 1 tablet (10 mg total) by mouth 3 (three) times daily as needed for muscle spasms. Patient not taking: Reported on 10/13/2015 09/14/15   [name], PA-C  ketoconazole (NIZORAL) 2 % cream Apply 1 application topically 2 (two) times daily as needed for irritation. Patient not taking: Reported on 05/27/2015 01/06/15   Punam Broussard, PA-C  lisinopril (PRINIVIL,ZESTRIL) 20 MG tablet Take 1 tablet (20 mg total) by mouth daily. Patient not taking: Reported on 10/13/2015 04/27/15   Leandrew Koyanagi, MD  naproxen sodium (ANAPROX) 220 MG tablet Take 1 tablet (220 mg total) by mouth 2 (two) times daily with a meal. Patient not taking: Reported on 05/27/2015 04/22/15   [name], PA-C  oxybutynin (DITROPAN) 5 MG tablet Take 1 tablet (5 mg total) by mouth 2 (two) times daily. Patient not taking: Reported on 10/13/2015 08/26/15   [name], PA-C  Vitamin D, Ergocalciferol, (DRISDOL) 50000 units CAPS capsule Take 1 capsule (50,000 Units total) by mouth every 7 (seven) days. Patient not taking: Reported on 10/13/2015 07/18/15   [name], PA-C     No Known Allergies     Objective:  Physical Exam  Constitutional: He is oriented to person, place, and time. He appears well-developed and well-nourished. He is active and cooperative. No distress.  BP 128/90 mmHg  Pulse 76  Temp(Src) 98.4 F (36.9 C) (Oral)  Resp 16  Ht 5' 3.25" (1.607 m)  Wt 278 lb 3.2 oz (126.191 kg)  BMI 48.86 kg/m2  SpO2 98%  HENT:  Head: Normocephalic and atraumatic.  Right Ear: Hearing normal.  Left Ear: Hearing normal.  Eyes: Conjunctivae are normal. No scleral icterus.  Neck: Normal range of motion. Neck supple. No thyromegaly present.  Cardiovascular: Normal rate, regular  rhythm and normal heart sounds.   Pulses:      Radial pulses are 2+ on the right side, and 2+ on the left side.  Pulmonary/Chest: Effort normal and breath sounds normal.  Abdominal: Hernia confirmed negative in the right inguinal area and confirmed negative in the left inguinal area.  Genitourinary: Testes normal and penis normal. Right testis shows no mass, no swelling and no tenderness. Right testis is descended. Cremasteric reflex is not absent on the right side. Left testis shows no mass, no swelling and no tenderness. Left testis is descended. Cremasteric reflex is not absent on the left side. Circumcised.     Musculoskeletal:       Right hip: He exhibits decreased range of motion (flexion and internal rotation cause pain at the origin in the perineum). He exhibits normal strength, no tenderness, no bony tenderness, no swelling, no crepitus, no deformity and no laceration.       Cervical back: Normal.       Thoracic back: Normal.  Lumbar back: Normal.  Pain with extending the hip from flexion.  Lymphadenopathy:       Head (right side): No tonsillar, no preauricular, no posterior auricular and no occipital adenopathy present.       Head (left side): No tonsillar, no preauricular, no posterior auricular and no occipital adenopathy present.    He has no cervical adenopathy.       Right: No inguinal and no supraclavicular adenopathy present.       Left: No inguinal and no supraclavicular adenopathy present.  Neurological: He is alert and oriented to person, place, and time. No sensory deficit.  Skin: Skin is warm, dry and intact. No rash noted. No cyanosis or erythema. Nails show no clubbing.  Psychiatric: He has a normal mood and affect. His speech is normal and behavior is normal.      Dg Lumbar Spine Complete  10/13/2015  CLINICAL DATA:  Low back pain for 1 month, no known injury EXAM: LUMBAR SPINE - COMPLETE 4+ VIEW COMPARISON:  None. FINDINGS: Five views of lumbar spine  submitted. No acute fracture or subluxation. Mild dextroscoliosis. Mild disc space flattening with moderate anterior spurring at T11-T12 level. There is disc space flattening with endplate sclerotic changes vacuum disc phenomenon mild anterior and mild posterior spurring at L2-L3 level. Moderate disc space flattening at L3-L4 level. Moderate disc space flattening with vacuum disc phenomenon and mild anterior spurring at L4-L5 level. Facet degenerative changes L4 and L5 level. IMPRESSION: No acute fracture or subluxation. Mild dextroscoliosis. Multilevel degenerative changes as described above. Electronically Signed   By: Lahoma Crocker M.D.   On: 10/13/2015 11:43   Dg Hip Unilat W Or W/o Pelvis 2-3 Views Right  10/13/2015  CLINICAL DATA:  Right hip pain, no known injury EXAM: DG HIP (WITH OR WITHOUT PELVIS) 2-3V RIGHT COMPARISON:  None. FINDINGS: Two views of the right hip submitted. No acute fracture or subluxation. There are mild degenerative changes with mild narrowing superior hip joint space. Mild superior acetabular spurring. Radiation seeds are noted in prostate gland region. IMPRESSION: No acute fracture or subluxation. Mild degenerative changes right hip joint. Electronically Signed   By: Lahoma Crocker M.D.   On: 10/13/2015 11:44        Assessment & Plan:   1. Controlled type 2 diabetes mellitus without complication, without long-term current use of insulin ([place]) Await lab results. Encouraged efforts for healthier eating choices and regular exercise. - HM Diabetes Foot Exam - Comprehensive metabolic panel - Hemoglobin A1c  2. Essential hypertension Controlled. Stable. Continue current treatment.  3. OSA (obstructive sleep apnea) Encouraged use of CPAP.  4. Morbid obesity, unspecified obesity type ([place]) Healthy lifestyle modifications, as above.  5. Nocturia Stable.  6. Hyperlipidemia LDL goal <70 Has been stable.  7. Vitamin D deficiency Await lab results. May need to resume 50,000  IU weekly dosing. - VITAMIN D 25 Hydroxy (Vit-D Deficiency, Fractures)  8. Groin pain, right Radiographs reveal mild degenerative changes in the hips. Anaprox. If pain worsens/persists, plan referral to orthopedics. - DG Lumbar Spine Complete; Future - DG HIP UNILAT W OR W/O PELVIS 2-3 VIEWS RIGHT; Future  9. Degenerative disc disease, lumbar Anaprox. Refer if worsens/persists. - naproxen sodium (ANAPROX) 220 MG tablet; Take 1 tablet (220 mg total) by mouth 2 (two) times daily with a meal.  Dispense: 180 tablet; Refill: 1    Return in about 3 months (around 01/13/2016).     Fara Chute, PA-C Physician Assistant-Certified [facility]

## 2015-10-13 NOTE — Progress Notes (Signed)
Subjective:    Patient ID: Rodney Walsh, male    DOB: [birth year], 65 y.o.   MRN: [id number]  Chief Complaint  Patient presents with  . Follow-up  . Diabetes  . right leg pain    up into the right side of groin x 4 wks   HPI  Patient presents today for diabetes and hypertension management follow up and complains of right leg pain.  Diabetes well controlled with 1075m metformin daily.  Patient has 8 year history of diabetes.  Admits to adding for fruits and vegetables into his diet recently but continues to eat the foods he enjoys; exercises very little.  He checks is blood sugar weekly, stating he is running low on test strips and is trying to conserve them.  Last reading Sunday - 152.    States hypertension is well controlled with lisinopril 216mdaily.  Does not monitor it at home.    Denies change in vision, chest pain, SOB, headache, abdominal pain, alcohol or tobacco use.  Complains of groin pain x4 weeks, "like a knife in there turned the wrong way that goes down the back of my leg to my knee, and sometimes after sitting for a while it goes down to my toes."  He first noticed in one morning when getting out of bed, exacerbated by sitting on hard surface, and when transitioning to standing, has to ease myself up to prevent pain.  Pain is reduced following bowel movement.  Flexeril relieved pain for a short period of time but is no longer working.  Energy level low because of this pain.   Admits to constipation x1wk, "I went yesterday if just has been more difficult for the last week", and nocturia waking him 2x a night, states this is baseline.  Does not use CPAP because he did not like the dry mouth associated with it.  Tooth ache - x 1 week - does not have regular dentist or dental insurance.  "I called one and they said they wanted $200 to pull it, and I'm not going to pay that."   Review of Systems All others negative except those listed in HPI.    Patient Active Problem  List   Diagnosis Date Noted  . 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 Diabetic retinopathy ([facility]/28/2017  . Nocturia 06/10/2014  . Cataract 09/25/2012  . OSA (obstructive sleep apnea) 08/10/2012  . Eczema 08/10/2012  . Vitamin D deficiency 08/10/2012  . Erectile dysfunction 08/10/2012  . Special screening for malignant neoplasms, colon 07/17/2012  . Personal history of adenomatous colonic polyps 04/08/2012  . Prostate cancer ([facility]/28/2013  . Diabetes type 2, controlled ([facility]06/18/2013  . HTN (hypertension) 10/25/2011  . Hyperlipidemia LDL goal <70 10/25/2011  . Morbid obesity ([facility]06/18/2013   Current Outpatient Prescriptions on File Prior to Visit  Medication Sig Dispense Refill  . aspirin 81 MG tablet Take 81 mg by mouth daily.    . Blood Glucose Monitoring Suppl (ACCU-CHEK AVIVA PLUS) W/DEVICE KIT     . glucose blood test strip Use as instructed 100 each 12  . metFORMIN (GLUCOPHAGE) 1000 MG tablet Take 1 tablet (1,000 mg total) by mouth 2 (two) times daily with a meal. 180 tablet 2  . clobetasol ointment (TEMOVATE) 0.5.53 Apply 1 application topically 2 (two) times daily. To the lesion on the LEG. (Patient not taking: Reported on 10/13/2015) 60 g 0  . cyclobenzaprine (FLEXERIL) 10 MG tablet Take 1 tablet (  10 mg total) by mouth 3 (three) times daily as needed for muscle spasms. (Patient not taking: Reported on 10/13/2015) 30 tablet 0  . ketoconazole (NIZORAL) 2 % cream Apply 1 application topically 2 (two) times daily as needed for irritation. (Patient not taking: Reported on 05/27/2015) 60 g 1  . lisinopril (PRINIVIL,ZESTRIL) 20 MG tablet Take 1 tablet (20 mg total) by mouth daily. (Patient not taking: Reported on 10/13/2015) 90 tablet 3  . oxybutynin (DITROPAN) 5 MG tablet Take 1 tablet (5 mg total) by mouth 2 (two) times daily. (Patient not taking: Reported on 10/13/2015) 180 tablet 3  . Vitamin D, Ergocalciferol, (DRISDOL) 50000 units CAPS capsule Take 1 capsule  (50,000 Units total) by mouth every 7 (seven) days. (Patient not taking: Reported on 10/13/2015) 12 capsule 3  . [DISCONTINUED] oxybutynin (DITROPAN) 5 MG tablet Take 1 tablet (5 mg total) by mouth 2 (two) times daily. 60 tablet 5   No current facility-administered medications on file prior to visit.   No Known Allergies  Objective: BP 128/90 mmHg  Pulse 76  Temp(Src) 98.4 F (36.9 C) (Oral)  Resp 16  Ht 5' 3.25" (1.607 m)  Wt 278 lb 3.2 oz (126.191 kg)  BMI 48.86 kg/m2  SpO2 98%   Physical Exam  Constitutional: He is oriented to person, place, and time. He appears well-developed and well-nourished.  obese  Cardiovascular: Normal rate, regular rhythm and normal heart sounds.   Pulmonary/Chest: Effort normal and breath sounds normal.  Musculoskeletal: Normal range of motion.  Full ROM of right hip, pain with full hip flexion, and when returning to neurtral position with straight leg; Pain relieved by adduction and abduction, +joint stiffness.  Neurological: He is alert and oriented to person, place, and time.  Right great toe is weak in extension.  Skin: Rash noted.  10cm x 2cm hypopigmented area on lateral right lower extremity.  Pruritic.     Diabetic Foot Exam - detailed   Diabetic Foot exam was performed with the following findings: Yes 10/13/2015 9:44 AM  Visual Foot Exam completed.: Yes   Is there a history of foot ulcer?: No   Can the patient see the bottom of their feet?: Yes   Are the shoes appropriate in style and fit?: Yes   Is there swelling or and abnormal foot shape?: No   Are the toenails long?: No   Are the toenails thick?: No   Do you have pain in calf while walking?: No   Is there a claw toe deformity?: No   Is there elevated skin temparature?: No   Is there limited skin dorsiflexion?: No   Is there foot or ankle muscle weakness?: No   Are the toenails ingrown?: No   Normal Range of Motion: Yes      Semmes-Weinstein Monofilament Test   R  Foot Test Control: Pos L Foot Test Control: Pos   R Site 1-Great Toe: Pos L Site 1-Great Toe: Pos   R Site 4: Pos L Site 4: Pos   R Site 5: Pos L Site 5: Pos     Comments: Pedal pulses felt on foot exam   Dg Lumbar Spine Complete  10/13/2015  CLINICAL DATA:  Low back pain for 1 month, no known injury EXAM: LUMBAR SPINE - COMPLETE 4+ VIEW COMPARISON:  None. FINDINGS: Five views of lumbar spine submitted. No acute fracture or subluxation. Mild dextroscoliosis. Mild disc space flattening with moderate anterior spurring at T11-T12 level. There is disc space flattening with endplate  sclerotic changes vacuum disc phenomenon mild anterior and mild posterior spurring at L2-L3 level. Moderate disc space flattening at L3-L4 level. Moderate disc space flattening with vacuum disc phenomenon and mild anterior spurring at L4-L5 level. Facet degenerative changes L4 and L5 level. IMPRESSION: No acute fracture or subluxation. Mild dextroscoliosis. Multilevel degenerative changes as described above. Electronically Signed   By: Lahoma Crocker M.D.   On: 10/13/2015 11:43   Dg Hip Unilat W Or W/o Pelvis 2-3 Views Right  10/13/2015  CLINICAL DATA:  Right hip pain, no known injury EXAM: DG HIP (WITH OR WITHOUT PELVIS) 2-3V RIGHT COMPARISON:  None. FINDINGS: Two views of the right hip submitted. No acute fracture or subluxation. There are mild degenerative changes with mild narrowing superior hip joint space. Mild superior acetabular spurring. Radiation seeds are noted in prostate gland region. IMPRESSION: No acute fracture or subluxation. Mild degenerative changes right hip joint. Electronically Signed   By: Lahoma Crocker M.D.   On: 10/13/2015 11:44      Assessment & Plan:  1. Controlled type 2 diabetes mellitus without complication, without long-term current use of insulin ([place]) Labs results pending, if abnormal values found will adjust management accordingly. Encouraged patient to check blood sugars daily and perform daily  foot exams. - HM Diabetes Foot Exam - Comprehensive metabolic panel - Hemoglobin A1c  2. Essential hypertension Stable, continue to take mediation as prescribed. See above  3. OSA (obstructive sleep apnea) Encouraged patient to use CPAP to improve sleep and overall health.    4. Morbid obesity, unspecified obesity type ([place]) Recommended to patient to exercise daily and maintain a low carb, low sodium diet, with lots of vegetables.  5. Nocturia Stable.  Continue current treatment.    6. Hyperlipidemia LDL goal <70 Stable, last lab results 04/14/16, within normal limits.    7. Vitamin D deficiency Levels drawn, results pending, will contact patient with results and manage as indicated. - VITAMIN D 25 Hydroxy (Vit-D Deficiency, Fractures)  8. Groin pain, right Imaging indicated mild arthritic changes to bilateral hip.  Prescribed Anaprox for pain relief, instructed not to take aleve along with this medication and to take with food to avoid possible adverse effects.  Instructed patient to call office if pain continues or worsens and will refer to ortho. - DG Lumbar Spine Complete; Future - DG HIP UNILAT W OR W/O PELVIS 2-3 VIEWS RIGHT; Future  9. Degenerative disc disease, lumbar Indicated on imaging.  Recommended patient take Anaprox for pain relief.  IF pain continues or worsens will refer to ortho.   - naproxen sodium (ANAPROX) 220 MG tablet; Take 1 tablet (220 mg total) by mouth 2 (two) times daily with a meal.  Dispense: 180 tablet; Refill: 1  Patient instructed to follow up in 3 months for management and contact office before if a question or problem occurs.  Amana Bouska P. Amethyst Gainer, PA-S

## 2015-10-13 NOTE — Telephone Encounter (Signed)
Pt is at the pharmacy now and was just seen about an hour ago and all that was called in for patient is over the counter [place] and he feels it was something else  Best number [phone]

## 2015-10-14 LAB — VITAMIN D 25 HYDROXY (VIT D DEFICIENCY, FRACTURES): VIT D 25 HYDROXY: 27 ng/mL — AB (ref 30–100)

## 2015-10-14 NOTE — Telephone Encounter (Signed)
Called patient and advised him that a blood glucose monitoring kit and strips were also called into pharmacy.  He will check back with them today and if they are not there he will call us back. 

## 2015-10-16 ENCOUNTER — Encounter: Payer: Self-pay | Admitting: Physician Assistant

## 2015-10-21 MED ORDER — GLUCOSE BLOOD VI STRP: ORAL_STRIP | Status: DC

## 2015-10-21 MED ORDER — ONETOUCH ULTRA SYSTEM W/DEVICE KIT: PACK | Status: AC

## 2015-10-21 NOTE — Telephone Encounter (Signed)
Check ins formulary and it looks like One Touch brand is preferred. Sent in Rxs for both the meter and strips. Notified pt on MyChart. Also, advised that pharm should have RFs on file from 3/11 RX of Vit D.

## 2015-11-05 ENCOUNTER — Telehealth: Payer: Self-pay

## 2015-11-05 NOTE — Telephone Encounter (Signed)
info for testing supplies completed and faxed back

## 2016-01-07 ENCOUNTER — Other Ambulatory Visit: Payer: Self-pay | Admitting: Physician Assistant

## 2016-01-07 DIAGNOSIS — E785 Hyperlipidemia, unspecified: Secondary | ICD-10-CM

## 2016-01-07 DIAGNOSIS — E1165 Type 2 diabetes mellitus with hyperglycemia: Secondary | ICD-10-CM

## 2016-01-07 DIAGNOSIS — IMO0002 Reserved for concepts with insufficient information to code with codable children: Secondary | ICD-10-CM

## 2016-01-08 ENCOUNTER — Encounter: Payer: Self-pay | Admitting: Physician Assistant

## 2016-02-09 ENCOUNTER — Ambulatory Visit (INDEPENDENT_AMBULATORY_CARE_PROVIDER_SITE_OTHER): Payer: Medicare Other | Admitting: Physician Assistant

## 2016-02-09 ENCOUNTER — Ambulatory Visit (INDEPENDENT_AMBULATORY_CARE_PROVIDER_SITE_OTHER): Payer: Medicare Other

## 2016-02-09 ENCOUNTER — Encounter: Payer: Self-pay | Admitting: Physician Assistant

## 2016-02-09 VITALS — BP 136/88 | HR 77 | Temp 98.6°F | Resp 18 | Ht 63.25 in | Wt 276.0 lb

## 2016-02-09 DIAGNOSIS — Z23 Encounter for immunization: Secondary | ICD-10-CM

## 2016-02-09 DIAGNOSIS — R351 Nocturia: Secondary | ICD-10-CM

## 2016-02-09 DIAGNOSIS — I10 Essential (primary) hypertension: Secondary | ICD-10-CM

## 2016-02-09 DIAGNOSIS — E785 Hyperlipidemia, unspecified: Secondary | ICD-10-CM | POA: Diagnosis not present

## 2016-02-09 DIAGNOSIS — M25561 Pain in right knee: Secondary | ICD-10-CM | POA: Diagnosis not present

## 2016-02-09 DIAGNOSIS — G4733 Obstructive sleep apnea (adult) (pediatric): Secondary | ICD-10-CM | POA: Diagnosis not present

## 2016-02-09 DIAGNOSIS — L309 Dermatitis, unspecified: Secondary | ICD-10-CM | POA: Diagnosis not present

## 2016-02-09 DIAGNOSIS — G8929 Other chronic pain: Secondary | ICD-10-CM

## 2016-02-09 DIAGNOSIS — E119 Type 2 diabetes mellitus without complications: Secondary | ICD-10-CM

## 2016-02-09 DIAGNOSIS — M1712 Unilateral primary osteoarthritis, left knee: Secondary | ICD-10-CM

## 2016-02-09 LAB — CBC WITH DIFFERENTIAL/PLATELET
BASOS PCT: 1 %
Basophils Absolute: 41 cells/uL (ref 0–200)
EOS PCT: 3 %
Eosinophils Absolute: 123 cells/uL (ref 15–500)
HCT: 41.5 % (ref 38.5–50.0)
Hemoglobin: 13.9 g/dL (ref 13.2–17.1)
LYMPHS PCT: 29 %
Lymphs Abs: 1189 cells/uL (ref 850–3900)
MCH: 30.2 pg (ref 27.0–33.0)
MCHC: 33.5 g/dL (ref 32.0–36.0)
MCV: 90 fL (ref 80.0–100.0)
MONOS PCT: 14 %
MPV: 9.5 fL (ref 7.5–12.5)
Monocytes Absolute: 574 cells/uL (ref 200–950)
Neutro Abs: 2173 cells/uL (ref 1500–7800)
Neutrophils Relative %: 53 %
Platelets: 289 10*3/uL (ref 140–400)
RBC: 4.61 MIL/uL (ref 4.20–5.80)
RDW: 13.2 % (ref 11.0–15.0)
WBC: 4.1 10*3/uL (ref 3.8–10.8)

## 2016-02-09 LAB — TSH: TSH: 1.25 mIU/L (ref 0.40–4.50)

## 2016-02-09 MED ORDER — CLOBETASOL PROPIONATE 0.05 % EX OINT: 1.0000 "application " | TOPICAL_OINTMENT | Freq: Two times a day (BID) | CUTANEOUS | 0 refills | Status: DC

## 2016-02-09 MED ORDER — OXYBUTYNIN CHLORIDE 5 MG PO TABS: 5.0000 mg | ORAL_TABLET | Freq: Two times a day (BID) | ORAL | 3 refills | Status: DC

## 2016-02-09 MED ORDER — MELOXICAM 15 MG PO TABS: 15.0000 mg | ORAL_TABLET | Freq: Every day | ORAL | 1 refills | Status: DC

## 2016-02-09 NOTE — Patient Instructions (Addendum)
STOP the Aleve. Take the Meloxicam ONCE each day instead.    IF you received an x-ray today, you will receive an invoice from [facility]. Please contact [facility] at [phone] with questions or concerns regarding your invoice.   IF you received labwork today, you will receive an invoice from [employer]. Please contact [facility] at [phone] with questions or concerns regarding your invoice.   Our billing staff will not be able to assist you with questions regarding bills from these companies.  You will be contacted with the lab results as soon as they are available. The fastest way to get your results is to activate your My Chart account. Instructions are located on the last page of this paperwork. If you have not heard from [place] regarding the results in 2 weeks, please contact this office.

## 2016-02-09 NOTE — Progress Notes (Signed)
Patient ID: Rodney Walsh, male    DOB: Apr 11, 1951, 65 y.o.   MRN: [id number]  PCP: [name], PA-C  Subjective:   Chief Complaint  Patient presents with  . Follow-up    3 month  . Knee Pain    right     HPI Presents for evaluation of diabetes, but also reporting several issues:  1. RIGHT knee pain. "Feels like it will break." Pain is primarily lateral and superior. Ran out of meloxicam. Aleve. Acetaminophen. Water + Vinegar. Helps, but doesn't resolve it. His wife refused to give him one of her pain pills. Has had a steroid injection in the past. At [facility] Last night it felt warm to the touch, but that resolved when he took Aleve and acetaminophen.  2. Has not been sleeping well. Wakes up to urinate 3 x/night. Able to go back to sleep. Gets up between 7:30-8 am, but feels very tired. Not using CPAP. "That thing has been sitting so long." Last used it about 2 months ago but caused terrible dry mouth.  Had a bad tooth, but he's been brushing it and rinsing with H2O2 and it doesn't hurt anymore. Has been years since he last saw a dentist.  Tolerating his medications without adverse effects. No CP, SOB, HA, dizziness. No Nausea, vomiting, diarrhea.   Review of Systems As above.    Patient Active Problem List   Diagnosis Date Noted  . Degenerative disc disease, lumbar 10/13/2015  . 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 Diabetic retinopathy ([place]) 10/04/2015  . Nocturia 06/10/2014  . Cataract 09/25/2012  . OSA (obstructive sleep apnea) 08/10/2012  . Eczema 08/10/2012  . Vitamin D deficiency 08/10/2012  . Erectile dysfunction 08/10/2012  . Special screening for malignant neoplasms, colon 07/17/2012  . Personal history of adenomatous colonic polyps 04/08/2012  . Prostate cancer ([place]) 01/04/2012  . Diabetes type 2, controlled ([place]) 10/25/2011  . HTN (hypertension) 10/25/2011  . Hyperlipidemia LDL goal <70 10/25/2011  .  Morbid obesity ([place]) 10/25/2011     Prior to Admission medications   Medication Sig Start Date End Date Taking? Authorizing Provider  aspirin 81 MG tablet Take 81 mg by mouth daily.   Yes Historical Provider, MD  Blood Glucose Monitoring Suppl (ONE TOUCH ULTRA SYSTEM KIT) w/Device KIT Use to test blood sugar daily. 10/21/15  Yes Sakai Heinle, PA-C  glucose blood test strip Use to test blood sugar daily. 10/21/15  Yes Kanoe Wanner, PA-C  lisinopril (PRINIVIL,ZESTRIL) 20 MG tablet Take 1 tablet (20 mg total) by mouth daily. 04/27/15  Yes Leandrew Koyanagi, MD  metFORMIN (GLUCOPHAGE) 1000 MG tablet TAKE ONE TABLET BY MOUTH TWICE DAILY WITH  A  MEAL. 01/08/16  Yes Yetta Marceaux, PA-C  oxybutynin (DITROPAN) 5 MG tablet Take 1 tablet (5 mg total) by mouth 2 (two) times daily. 08/26/15  Yes Lashanta Elbe, PA-C  Vitamin D, Ergocalciferol, (DRISDOL) 50000 units CAPS capsule Take 1 capsule (50,000 Units total) by mouth every 7 (seven) days. 07/18/15  Yes Cherylee Rawlinson, PA-C  cyclobenzaprine (FLEXERIL) 10 MG tablet Take 1 tablet (10 mg total) by mouth 3 (three) times daily as needed for muscle spasms. Patient not taking: Reported on 02/09/2016 09/14/15   [name], PA-C     No Known Allergies     Objective:  Physical Exam  Constitutional: He is oriented to person, place, and time. He appears well-developed and well-nourished. He is active and cooperative. No distress.  BP 136/88 (BP  Location: Right Arm, Patient Position: Sitting, Cuff Size: Large)   Pulse 77   Temp 98.6 F (37 C) (Oral)   Resp 18   Ht 5' 3.25" (1.607 m)   Wt 276 lb (125.2 kg)   SpO2 95%   BMI 48.51 kg/m   HENT:  Head: Normocephalic and atraumatic.  Right Ear: Hearing normal.  Left Ear: Hearing normal.  Eyes: Conjunctivae are normal. No scleral icterus.  Neck: Normal range of motion. Neck supple. No thyromegaly present.  Cardiovascular: Normal rate, regular rhythm and normal heart sounds.   Pulses:      Radial  pulses are 2+ on the right side, and 2+ on the left side.  Pulmonary/Chest: Effort normal and breath sounds normal.  Musculoskeletal:       Right knee: He exhibits normal range of motion, no swelling, no effusion, no ecchymosis, no deformity, no laceration, no erythema, normal alignment, no LCL laxity, normal patellar mobility, no bony tenderness, normal meniscus and no MCL laxity. No tenderness found.       Left knee: He exhibits normal range of motion, no swelling and no bony tenderness. No tenderness found.  Crepitus with ROM both knees  Lymphadenopathy:       Head (right side): No tonsillar, no preauricular, no posterior auricular and no occipital adenopathy present.       Head (left side): No tonsillar, no preauricular, no posterior auricular and no occipital adenopathy present.    He has no cervical adenopathy.       Right: No supraclavicular adenopathy present.       Left: No supraclavicular adenopathy present.  Neurological: He is alert and oriented to person, place, and time. No sensory deficit.  Skin: Skin is warm, dry and intact. No rash noted. No cyanosis or erythema. Nails show no clubbing.  Psychiatric: He has a normal mood and affect. His speech is normal and behavior is normal.           Assessment & Plan:   1. Controlled type 2 diabetes mellitus without complication, without long-term current use of insulin ([place]) Await labs. Adjust regimen as indicated by results. - Comprehensive metabolic panel - Hemoglobin A1c  2. Essential hypertension Controlled. - CBC with Differential/Platelet - Comprehensive metabolic panel - TSH  3. Hyperlipidemia LDL goal <70 Await labs. Adjust regimen as indicated by results. - Comprehensive metabolic panel  4. Morbid obesity ([place]) Counseled on the importance of healthy eating and regular exercise.  5. Nocturia Refill oxybutynin, as it helps. - oxybutynin (DITROPAN) 5 MG tablet; Take 1 tablet (5 mg total) by mouth 2 (two) times  daily.  Dispense: 180 tablet; Refill: 3  6. OSA (obstructive sleep apnea) Refer back to neurology for discussion of alternatives to the mask he currently has (with humidified air).   7. Chronic pain of right knee Await report. Suspect DJD, possibly significant. Anticipate referral back to [employer]. Resume meloxicam, STOP Aleve. - DG Knee Complete 4 Views Right; Future  8. Need for prophylactic vaccination and inoculation against influenza - Flu Vaccine QUAD 36+ mos IM  9. Eczema, unspecified type Stable. - clobetasol ointment (TEMOVATE) 0.05 %; Apply 1 application topically 2 (two) times daily. To the lesion on the LEG.  Dispense: 60 g; Refill: 0    Return in about 6 months (around 08/09/2016) for re-evaluation and fasting labs.    Fara Chute, PA-C Physician Assistant-Certified [facility]

## 2016-02-10 ENCOUNTER — Encounter: Payer: Self-pay | Admitting: Physician Assistant

## 2016-02-10 LAB — COMPREHENSIVE METABOLIC PANEL
ALK PHOS: 58 U/L (ref 40–115)
ALT: 16 U/L (ref 9–46)
AST: 17 U/L (ref 10–35)
Albumin: 4.2 g/dL (ref 3.6–5.1)
BILIRUBIN TOTAL: 0.4 mg/dL (ref 0.2–1.2)
BUN: 10 mg/dL (ref 7–25)
CO2: 30 mmol/L (ref 20–31)
Calcium: 9.6 mg/dL (ref 8.6–10.3)
Chloride: 102 mmol/L (ref 98–110)
Creat: 0.95 mg/dL (ref 0.70–1.25)
GLUCOSE: 89 mg/dL (ref 65–99)
Potassium: 5.2 mmol/L (ref 3.5–5.3)
Sodium: 139 mmol/L (ref 135–146)
Total Protein: 7 g/dL (ref 6.1–8.1)

## 2016-02-10 LAB — HEMOGLOBIN A1C
Hgb A1c MFr Bld: 6.3 % — ABNORMAL HIGH (ref <5.7)
Mean Plasma Glucose: 134 mg/dL

## 2016-02-12 ENCOUNTER — Encounter: Payer: Self-pay | Admitting: Physician Assistant

## 2016-02-15 ENCOUNTER — Encounter: Payer: Self-pay | Admitting: Physician Assistant

## 2016-02-16 ENCOUNTER — Encounter: Payer: Self-pay | Admitting: Physician Assistant

## 2016-02-20 DIAGNOSIS — S8992XA Unspecified injury of left lower leg, initial encounter: Secondary | ICD-10-CM | POA: Diagnosis not present

## 2016-02-23 ENCOUNTER — Ambulatory Visit (INDEPENDENT_AMBULATORY_CARE_PROVIDER_SITE_OTHER): Payer: Medicare Other | Admitting: Sports Medicine

## 2016-02-23 ENCOUNTER — Other Ambulatory Visit: Payer: Self-pay | Admitting: Physician Assistant

## 2016-02-23 DIAGNOSIS — M1711 Unilateral primary osteoarthritis, right knee: Secondary | ICD-10-CM

## 2016-02-23 DIAGNOSIS — I10 Essential (primary) hypertension: Secondary | ICD-10-CM

## 2016-02-29 ENCOUNTER — Encounter: Payer: Self-pay | Admitting: Physician Assistant

## 2016-02-29 ENCOUNTER — Telehealth (INDEPENDENT_AMBULATORY_CARE_PROVIDER_SITE_OTHER): Payer: Self-pay | Admitting: Sports Medicine

## 2016-02-29 DIAGNOSIS — I10 Essential (primary) hypertension: Secondary | ICD-10-CM

## 2016-02-29 MED ORDER — LISINOPRIL 20 MG PO TABS: 20.0000 mg | ORAL_TABLET | Freq: Every day | ORAL | 1 refills | Status: DC

## 2016-02-29 NOTE — Telephone Encounter (Signed)
Talked with patient and he stated that injection only helped for 2 days. Would like to discuss other options. CB [phone].

## 2016-02-29 NOTE — Telephone Encounter (Signed)
I am happy to see him back however suspect that he is likely having worsening of his underlying arthritis & the next appropriate step is likely going to be a total knee arthroplasty. I can see him back & he will need weight-bearing films of the right knee as the previous ones were & likely under called the degree of change. Please schedule him a follow-up to see me to discuss total knee replacement.  Once again will need to view x-rays of the right knee with weightbearing AP.

## 2016-03-02 NOTE — Telephone Encounter (Signed)
Talked with patient and advised him of message per Dr. Paulla Fore concerning return appointment to discuss Total Knee Replacement.  Patient declined to come in to discuss.

## 2016-03-10 ENCOUNTER — Encounter: Payer: Self-pay | Admitting: Physician Assistant

## 2016-03-14 ENCOUNTER — Telehealth: Payer: Self-pay

## 2016-03-14 ENCOUNTER — Encounter: Payer: Self-pay | Admitting: Physician Assistant

## 2016-03-14 NOTE — Telephone Encounter (Signed)
Pt is calling back to check on the status of his mychart message that he put in today. He would like for chelle to give him a call  Please advise  [phone]

## 2016-03-15 ENCOUNTER — Ambulatory Visit (INDEPENDENT_AMBULATORY_CARE_PROVIDER_SITE_OTHER): Payer: Medicare Other | Admitting: Physician Assistant

## 2016-03-15 ENCOUNTER — Encounter: Payer: Self-pay | Admitting: Physician Assistant

## 2016-03-15 ENCOUNTER — Other Ambulatory Visit: Payer: Self-pay | Admitting: Physician Assistant

## 2016-03-15 VITALS — BP 118/84 | HR 86 | Temp 98.8°F | Resp 16 | Ht 63.0 in | Wt 272.8 lb

## 2016-03-15 DIAGNOSIS — R768 Other specified abnormal immunological findings in serum: Secondary | ICD-10-CM | POA: Diagnosis not present

## 2016-03-15 DIAGNOSIS — C61 Malignant neoplasm of prostate: Secondary | ICD-10-CM | POA: Diagnosis not present

## 2016-03-15 DIAGNOSIS — R350 Frequency of micturition: Secondary | ICD-10-CM | POA: Diagnosis not present

## 2016-03-15 DIAGNOSIS — M25519 Pain in unspecified shoulder: Secondary | ICD-10-CM | POA: Insufficient documentation

## 2016-03-15 DIAGNOSIS — M25511 Pain in right shoulder: Secondary | ICD-10-CM

## 2016-03-15 DIAGNOSIS — R7689 Other specified abnormal immunological findings in serum: Secondary | ICD-10-CM

## 2016-03-15 DIAGNOSIS — I10 Essential (primary) hypertension: Secondary | ICD-10-CM

## 2016-03-15 LAB — POCT URINALYSIS DIP (MANUAL ENTRY)
BILIRUBIN UA: NEGATIVE
Bilirubin, UA: NEGATIVE
Blood, UA: NEGATIVE
GLUCOSE UA: NEGATIVE
LEUKOCYTES UA: NEGATIVE
Nitrite, UA: NEGATIVE
Protein Ur, POC: NEGATIVE
SPEC GRAV UA: 1.015
Urobilinogen, UA: 1
pH, UA: 7

## 2016-03-15 LAB — COMPREHENSIVE METABOLIC PANEL
ALBUMIN: 4.4 g/dL (ref 3.6–5.1)
ALK PHOS: 65 U/L (ref 40–115)
ALT: 13 U/L (ref 9–46)
AST: 13 U/L (ref 10–35)
BUN: 9 mg/dL (ref 7–25)
CHLORIDE: 100 mmol/L (ref 98–110)
CO2: 25 mmol/L (ref 20–31)
CREATININE: 0.95 mg/dL (ref 0.70–1.25)
Calcium: 10 mg/dL (ref 8.6–10.3)
Glucose, Bld: 137 mg/dL — ABNORMAL HIGH (ref 65–99)
POTASSIUM: 4.6 mmol/L (ref 3.5–5.3)
Sodium: 137 mmol/L (ref 135–146)
TOTAL PROTEIN: 7.5 g/dL (ref 6.1–8.1)
Total Bilirubin: 0.6 mg/dL (ref 0.2–1.2)

## 2016-03-15 LAB — PSA: PSA: 0.1 ng/mL (ref <=4.0)

## 2016-03-15 NOTE — Patient Instructions (Addendum)
Restart the MELOXICAM. When the pain improves, stop it again. Stay on the tylenol. Contact the orthopedic doctor if the pain isn't better on the meloxicam.    IF you received an x-ray today, you will receive an invoice from [facility] Radiology. Please contact [facility] Radiology at [phone] with questions or concerns regarding your invoice.   IF you received labwork today, you will receive an invoice from [employer]. Please contact [facility] at [phone] with questions or concerns regarding your invoice.   Our billing staff will not be able to assist you with questions regarding bills from these companies.  You will be contacted with the lab results as soon as they are available. The fastest way to get your results is to activate your My Chart account. Instructions are located on the last page of this paperwork. If you have not heard from [place] regarding the results in 2 weeks, please contact this office.

## 2016-03-15 NOTE — Progress Notes (Signed)
Subjective:    Patient ID: Rodney Walsh, male    DOB: [birth year], 65 y.o.   MRN: [id number]  Patient Care Team: [name], PA-C as PCP - General (Physician Assistant) Nat Christen, MD as Attending Physician (Optometry)  Chief Complaint  Patient presents with  . achy all over body    x 3 days    HPI: Presents for joint aches and pains which began a few months ago. States severe elbow and shoulder pain began 2 days ago. Worse when laying down. Also notes pain in his hips, knees, fingers, and wrists. Endorses stiffness and trouble with certain movements, such as raising his hands above his head.. Denies swelling ,erythema, or warmth in any of the joints. States he received a cortisone injection in his right knee last month which helped with the pain at that site. States the orthopedic told him to stop taking the Mobic and to only use Extra strength Tylenol for the pain. Patient states it provides some relief for a couple of hours but not much. Uses heating pad on back at ngiht which provides some help and ice on shoulder and arm which helps to sleep. Sitting for too long increases the stiffness.   Chart review reveals x-ray of right knee on 02/09/16 with mild degenerative joint disease medially with osteophyte formation without acute abnormality. Right hip x-ray 10/13/2015 reveals mild degenerative changes in the right hip joint with mild narrowing of joint space.  He has also noticed increased urination, stating he urinates 4-5x/night. States if he hears water running has to go right then. He feels as though the frequency has been increasing recently. Diagnosed with prostate cancer in 2008 and states he is currently on no medication for this and has not seen urology in 3-5 years. Denies dribbling, difficulty starting a stream, dysuria, hematuria. Notes some urgency during the day.  Review of Systems Pertinent ROS mentioned above in HPI.  No Known Allergies  Prior to Admission  medications   Medication Sig Start Date End Date Taking? Authorizing Provider  aspirin 81 MG tablet Take 81 mg by mouth daily.   Yes Historical Provider, MD  Blood Glucose Monitoring Suppl (ONE TOUCH ULTRA SYSTEM KIT) w/Device KIT Use to test blood sugar daily. 10/21/15  Yes Chelle Jeffery, PA-C  clobetasol ointment (TEMOVATE) 0.51 % Apply 1 application topically 2 (two) times daily. To the lesion on the LEG. 02/09/16  Yes Chelle Jeffery, PA-C  glucose blood test strip Use to test blood sugar daily. 10/21/15  Yes Chelle Jeffery, PA-C  lisinopril (PRINIVIL,ZESTRIL) 20 MG tablet Take 1 tablet (20 mg total) by mouth daily. 02/29/16  Yes Chelle Jeffery, PA-C  metFORMIN (GLUCOPHAGE) 1000 MG tablet TAKE ONE TABLET BY MOUTH TWICE DAILY WITH  A  MEAL. 01/08/16  Yes Chelle Jeffery, PA-C  oxybutynin (DITROPAN) 5 MG tablet Take 1 tablet (5 mg total) by mouth 2 (two) times daily. 02/09/16  Yes Chelle Jeffery, PA-C  Vitamin D, Ergocalciferol, (DRISDOL) 50000 units CAPS capsule Take 1 capsule (50,000 Units total) by mouth every 7 (seven) days. 07/18/15  Yes Chelle Jeffery, PA-C  cyclobenzaprine (FLEXERIL) 10 MG tablet Take 1 tablet (10 mg total) by mouth 3 (three) times daily as needed for muscle spasms. Patient not taking: Reported on 03/15/2016 09/14/15   [name], PA-C  meloxicam (MOBIC) 15 MG tablet Take 1 tablet (15 mg total) by mouth daily. Patient not taking: Reported on 03/15/2016 02/09/16   [name], PA-C   Patient Active Problem List  Diagnosis Date Noted  . Urinary frequency 03/15/2016  . Pain in joint, shoulder region 03/15/2016  . Degenerative disc disease, lumbar 10/13/2015  . 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 Diabetic retinopathy ([place]) 10/04/2015  . Nocturia 06/10/2014  . Cataract 09/25/2012  . OSA (obstructive sleep apnea) 08/10/2012  . Eczema 08/10/2012  . Vitamin D deficiency 08/10/2012  . Erectile dysfunction 08/10/2012  . Personal history of adenomatous  colonic polyps 04/08/2012  . Prostate cancer ([place]) 01/04/2012  . Diabetes type 2, controlled ([place]) 10/25/2011  . HTN (hypertension) 10/25/2011  . Hyperlipidemia LDL goal <70 10/25/2011  . Morbid obesity ([place]) 10/25/2011        Objective:   Physical Exam  Constitutional: He is oriented to person, place, and time. He appears well-developed and well-nourished.  HENT:  Head: Normocephalic and atraumatic.  Neck: Normal range of motion.  Cardiovascular:  Pulses:      Popliteal pulses are 1+ on the right side, and 1+ on the left side.       Dorsalis pedis pulses are 1+ on the right side, and 1+ on the left side.       Posterior tibial pulses are 1+ on the right side, and 1+ on the left side.  Musculoskeletal:       Right shoulder: He exhibits decreased range of motion, tenderness, bony tenderness, pain and decreased strength. He exhibits no swelling, no effusion, no crepitus, no deformity and no laceration.       Left shoulder: He exhibits decreased range of motion. He exhibits no tenderness, no bony tenderness, no swelling, no effusion, no crepitus, no deformity, no laceration, no pain, no spasm, normal pulse and normal strength.       Right elbow: He exhibits normal range of motion, no swelling, no effusion, no deformity and no laceration. No tenderness found.       Left elbow: He exhibits normal range of motion, no swelling, no effusion, no deformity and no laceration. No tenderness found.       Right wrist: Normal.       Left wrist: Normal.       Right hip: He exhibits decreased range of motion and bony tenderness. He exhibits normal strength, no tenderness, no swelling, no crepitus, no deformity and no laceration.       Left hip: He exhibits normal range of motion, normal strength, no tenderness, no bony tenderness, no swelling, no crepitus, no deformity and no laceration.       Right knee: He exhibits normal range of motion, no swelling, no effusion, no ecchymosis, no deformity, no  laceration, no erythema, normal alignment and no bony tenderness. No tenderness found.       Left knee: He exhibits normal range of motion, no swelling, no effusion, no ecchymosis, no deformity, no laceration, no erythema, normal alignment and no bony tenderness. No tenderness found.       Right ankle: He exhibits normal range of motion, no swelling, no ecchymosis and no deformity. No tenderness.       Left ankle: He exhibits normal range of motion, no swelling, no ecchymosis and no deformity. No tenderness.       Thoracic back: He exhibits decreased range of motion and pain. He exhibits no tenderness, no bony tenderness, no swelling, no edema, no deformity and no laceration.       Lumbar back: He exhibits decreased range of motion and pain. He exhibits no tenderness, no bony tenderness, no swelling, no edema,  no deformity and no laceration.       Arms:      Right hand: He exhibits normal range of motion, no tenderness, no bony tenderness, no deformity and no swelling. Normal strength noted.       Left hand: He exhibits normal range of motion, no tenderness, no bony tenderness, no deformity and no swelling. Normal strength noted.  Neurological: He is alert and oriented to person, place, and time.  Reflex Scores:      Brachioradialis reflexes are 2+ on the right side and 2+ on the left side.      Patellar reflexes are 1+ on the right side and 1+ on the left side.      Achilles reflexes are 2+ on the right side and 2+ on the left side. Skin: Skin is warm and dry.  Psychiatric: He has a normal mood and affect. His behavior is normal.   Results for orders placed or performed in visit on 03/15/16  POCT urinalysis dipstick  Result Value Ref Range   Color, UA yellow yellow   Clarity, UA clear clear   Glucose, UA negative negative   Bilirubin, UA negative negative   Ketones, POC UA negative negative   Spec Grav, UA 1.015    Blood, UA negative negative   pH, UA 7.0    Protein Ur, POC negative  negative   Urobilinogen, UA 1.0    Nitrite, UA Negative Negative   Leukocytes, UA Negative Negative       Assessment & Plan:  1. Pain in joint of right shoulder Begin taking Mobic again for pain as well as continuing extra strength Tylenol as needed for pain. Continue rest, heat, and ice. RA panel and CMP pending. Recommended follow-up with orthopedics if labs come back normal and pain persists with use of both medications. - Rheumatoid Arthritis Diagnostic Panel - Comprehensive metabolic panel  2. Urinary frequency UA within normal limits. PSA pending at this time, will update patient with results. Discussed could be due to high blood sugar as well so will check CMP and adjust DM medication regimen as needed upon review of results. - PSA - POCT urinalysis dipstick - Urine Microscopic  3. Prostate cancer [facility]) History of prostate cancer, recently with increasing urinary frequency. - PSA

## 2016-03-16 ENCOUNTER — Encounter: Payer: Self-pay | Admitting: Physician Assistant

## 2016-03-16 LAB — URINALYSIS, MICROSCOPIC ONLY
Bacteria, UA: NONE SEEN [HPF]
CASTS: NONE SEEN [LPF]
Crystals: NONE SEEN [HPF]
RBC / HPF: NONE SEEN RBC/HPF (ref <=2)
SQUAMOUS EPITHELIAL / LPF: NONE SEEN [HPF] (ref <=5)
WBC UA: NONE SEEN WBC/HPF (ref <=5)
Yeast: NONE SEEN [HPF]

## 2016-03-16 LAB — RHEUMATOID ARTHRITIS DIAGNOSTIC PANEL
Cyclic Citrullin Peptide Ab: <16 Units
Rhuematoid fact SerPl-aCnc: 23 IU/mL — ABNORMAL HIGH (ref <14)

## 2016-03-19 NOTE — Telephone Encounter (Signed)
Chelle has responded to his my chart message

## 2016-03-21 NOTE — Progress Notes (Signed)
Patient ID: Rodney Walsh, male    DOB: April 03, 1951, 65 y.o.   MRN: [id number]  PCP: [name], PA-C  Chief Complaint  Patient presents with  . achy all over body    x 3 days    Subjective:    HPI Presents for evaluation of joint pain x 2 months, worse x 3 days.  Currently the elbow and shoulder is the worst, but there is also pain in the hips, knees, fingers and wrists. More severe with lying down. Stiffness. Reduced ROM. No swelling, redness or warmth of any joints.  Knee injection with ortho last month really helped. He has DJD of the RIGHT knee and RIGHT hip. Orthopedist told him to stop the meloxicam and use only acetaminophen, but it only helps for a few hours. Heat and ice application help, but he's having trouble sleeping.  Also relates increased nocturia, getting up 4-5 times/night. Anytime he hears water running, he has to urinate.Some daytime urgency. H/o prostate ca (2008) but has not followed up with urology in 3 years. No dysuria, hematuria, incontinence, difficulty starting the stream.    Review of Systems As above.    Patient Active Problem List   Diagnosis Date Noted  . Urinary frequency 03/15/2016  . Pain in joint, shoulder region 03/15/2016  . Degenerative disc disease, lumbar 10/13/2015  . 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 Diabetic retinopathy ([place]) 10/04/2015  . Nocturia 06/10/2014  . Cataract 09/25/2012  . OSA (obstructive sleep apnea) 08/10/2012  . Eczema 08/10/2012  . Vitamin D deficiency 08/10/2012  . Erectile dysfunction 08/10/2012  . Personal history of adenomatous colonic polyps 04/08/2012  . Prostate cancer ([place]) 01/04/2012  . Diabetes type 2, controlled ([place]) 10/25/2011  . HTN (hypertension) 10/25/2011  . Hyperlipidemia LDL goal <70 10/25/2011  . Morbid obesity ([place]) 10/25/2011     Prior to Admission medications   Medication Sig Start Date End Date Taking? Authorizing Provider  aspirin 81 MG  tablet Take 81 mg by mouth daily.   Yes Historical Provider, MD  Blood Glucose Monitoring Suppl (ONE TOUCH ULTRA SYSTEM KIT) w/Device KIT Use to test blood sugar daily. 10/21/15  Yes Shareen Capwell, PA-C  clobetasol ointment (TEMOVATE) 3.15 % Apply 1 application topically 2 (two) times daily. To the lesion on the LEG. 02/09/16  Yes Florrie Ramires, PA-C  glucose blood test strip Use to test blood sugar daily. 10/21/15  Yes Alysa Duca, PA-C  lisinopril (PRINIVIL,ZESTRIL) 20 MG tablet Take 1 tablet (20 mg total) by mouth daily. 02/29/16  Yes Tattianna Schnarr, PA-C  metFORMIN (GLUCOPHAGE) 1000 MG tablet TAKE ONE TABLET BY MOUTH TWICE DAILY WITH  A  MEAL. 01/08/16  Yes Yessica Putnam, PA-C  oxybutynin (DITROPAN) 5 MG tablet Take 1 tablet (5 mg total) by mouth 2 (two) times daily. 02/09/16  Yes Karrington Studnicka, PA-C  Vitamin D, Ergocalciferol, (DRISDOL) 50000 units CAPS capsule Take 1 capsule (50,000 Units total) by mouth every 7 (seven) days. 07/18/15  Yes Melah Ebling, PA-C  cyclobenzaprine (FLEXERIL) 10 MG tablet Take 1 tablet (10 mg total) by mouth 3 (three) times daily as needed for muscle spasms. Patient not taking: Reported on 03/15/2016 09/14/15   [name], PA-C  meloxicam (MOBIC) 15 MG tablet Take 1 tablet (15 mg total) by mouth daily. Patient not taking: Reported on 03/15/2016 02/09/16   [name], PA-C     No Known Allergies     Objective:  Physical Exam  Constitutional: He is oriented to  person, place, and time. He appears well-developed and well-nourished. He is active and cooperative. No distress.  BP 118/84 (BP Location: Left Arm, Cuff Size: Large)   Pulse 86   Temp 98.8 F (37.1 C) (Oral)   Resp 16   Ht '5\' 3"'  (1.6 m)   Wt 272 lb 12.8 oz (123.7 kg)   SpO2 97%   BMI 48.32 kg/m   HENT:  Head: Normocephalic and atraumatic.  Right Ear: Hearing normal.  Left Ear: Hearing normal.  Eyes: Conjunctivae are normal. No scleral icterus.  Neck: Normal range of motion. Neck supple.  No thyromegaly present.  Cardiovascular: Normal rate, regular rhythm and normal heart sounds.   Pulses:      Radial pulses are 2+ on the right side, and 2+ on the left side.  Pulmonary/Chest: Effort normal and breath sounds normal.  Musculoskeletal:       Right shoulder: He exhibits decreased range of motion, tenderness (diffusely) and pain. He exhibits no bony tenderness, no swelling, no effusion, no crepitus, no deformity, no laceration, no spasm, normal pulse and normal strength.       Left shoulder: He exhibits decreased range of motion and pain. He exhibits no tenderness, no bony tenderness, no swelling, no effusion, no crepitus, no deformity, no laceration, no spasm, normal pulse and normal strength.       Right elbow: Normal.      Left elbow: Normal.       Right wrist: Normal.       Left wrist: Normal.       Right hip: He exhibits decreased range of motion, decreased strength and tenderness. He exhibits no bony tenderness, no swelling, no crepitus, no deformity and no laceration.       Left hip: He exhibits decreased range of motion. He exhibits normal strength, no tenderness, no bony tenderness, no swelling, no crepitus, no deformity and no laceration.       Cervical back: Normal.       Thoracic back: Normal.       Lumbar back: He exhibits decreased range of motion and tenderness. He exhibits no bony tenderness, no swelling, no edema, no deformity, no laceration, no pain, no spasm and normal pulse.  Lymphadenopathy:       Head (right side): No tonsillar, no preauricular, no posterior auricular and no occipital adenopathy present.       Head (left side): No tonsillar, no preauricular, no posterior auricular and no occipital adenopathy present.    He has no cervical adenopathy.       Right: No supraclavicular adenopathy present.       Left: No supraclavicular adenopathy present.  Neurological: He is alert and oriented to person, place, and time. No sensory deficit.  Skin: Skin is warm,  dry and intact. No rash noted. No cyanosis or erythema. Nails show no clubbing.  Psychiatric: He has a normal mood and affect. His speech is normal and behavior is normal.           Assessment & Plan:   1. Pain in joint of right shoulder Known OA in the RIGHT knee and hip. With multiple joint pains, consider rheumatologic issue. - Rheumatoid Arthritis Diagnostic Panel - Comprehensive metabolic panel  2. Urinary frequency He had some urinary symptoms following radioactive seed implantsshortly after his prostate ca diagnosis. Update labs. Hemoglobin A1C was 6.3 last month, so doubt this is diabetes, though if he's had increased glucose recently, that would certainly contribute. - PSA - POCT urinalysis dipstick -  Urine Microscopic  3. Prostate cancer ([place]) - PSA   Fara Chute, PA-C Physician Assistant-Certified [facility]

## 2016-04-15 DIAGNOSIS — M1611 Unilateral primary osteoarthritis, right hip: Secondary | ICD-10-CM | POA: Diagnosis not present

## 2016-04-15 DIAGNOSIS — M5136 Other intervertebral disc degeneration, lumbar region: Secondary | ICD-10-CM | POA: Diagnosis not present

## 2016-04-15 DIAGNOSIS — M76891 Other specified enthesopathies of right lower limb, excluding foot: Secondary | ICD-10-CM | POA: Diagnosis not present

## 2016-04-15 DIAGNOSIS — M25551 Pain in right hip: Secondary | ICD-10-CM | POA: Diagnosis not present

## 2016-04-21 DIAGNOSIS — M7061 Trochanteric bursitis, right hip: Secondary | ICD-10-CM | POA: Diagnosis not present

## 2016-04-21 DIAGNOSIS — M9904 Segmental and somatic dysfunction of sacral region: Secondary | ICD-10-CM | POA: Diagnosis not present

## 2016-04-21 DIAGNOSIS — M76891 Other specified enthesopathies of right lower limb, excluding foot: Secondary | ICD-10-CM | POA: Diagnosis not present

## 2016-04-24 ENCOUNTER — Encounter: Payer: Self-pay | Admitting: Physician Assistant

## 2016-04-25 ENCOUNTER — Other Ambulatory Visit: Payer: Self-pay | Admitting: Physician Assistant

## 2016-04-25 ENCOUNTER — Telehealth: Payer: Self-pay | Admitting: Physician Assistant

## 2016-04-25 DIAGNOSIS — G4733 Obstructive sleep apnea (adult) (pediatric): Secondary | ICD-10-CM

## 2016-04-25 NOTE — Telephone Encounter (Signed)
Fax received requesting CPAP supplies, asking for machine type, pressure settings, etc. Please locate titration report from patient's last sleep study for my reference. Form is in my box in the provider lounge.

## 2016-05-05 NOTE — Telephone Encounter (Signed)
We do not have a copy of pt's sleep study in EPIC. Called pt and LMOM asking him to call to verify that he DOES want to order CPAP supplies through [place], and advising we will need a copy of his latest sleep study titration report to complete order.

## 2016-05-09 ENCOUNTER — Encounter: Payer: Self-pay | Admitting: Physician Assistant

## 2016-05-10 NOTE — Telephone Encounter (Signed)
Called pt back since didn't hear from him and he reported that he does want to get the supplies but doesn't have a copy of his sleep study and it has been years. He will call the place that did the study to see if they have a record and call me back.

## 2016-05-10 NOTE — Telephone Encounter (Signed)
Chelle, pt stated that he called facility who did last sleep study and their records only go back 8 yrs and his was before that. He will need a new sleep study done. OK to put in referral order, and if so do you prefer a particular facility?

## 2016-05-19 ENCOUNTER — Telehealth: Payer: Self-pay

## 2016-05-19 ENCOUNTER — Institutional Professional Consult (permissible substitution): Payer: Medicare Other | Admitting: Neurology

## 2016-05-19 NOTE — Telephone Encounter (Signed)
I spoke to patient and r/s his appt, provider is out sick

## 2016-05-28 ENCOUNTER — Encounter: Payer: Self-pay | Admitting: Physician Assistant

## 2016-06-01 NOTE — Progress Notes (Addendum)
 Office Visit Note  Patient: Rodney Walsh             Date of Birth: 03/05/1951           MRN: [id number]             PCP: Chelle Jeffery, PA-C Referring: Jeffery, Chelle, PA-C Visit Date: 06/02/2016 Occupation: Retired, bus driver    Subjective:  Right hip and right knee pain   History of Present Illness: Rodney Walsh is a 66 y.o. male  seen in consultation per request of his PCP. According to patient about a year ago he started having increasing stiffness and difficulty getting in and out of his truck. He states the symptoms is started getting worse. He had overtime difficulty standing and poor balance. He states he is in constant pain in his right hip and has difficulty walking. His PCP referred him to Dr. Rigby who evaluated patient and gave him cortisone injection to his right knee joint. He states the cortisone injection helped him only for 1 day and then the symptoms recurred. He started going to his chiropractor who did manipulation and that helped him only for short time. The labs done by his  PCP showed positive rheumatoid factor and he was referred here for further evaluation. He denies any history of joint swelling.  Activities of Daily Living:  Patient reports morning stiffness for45 minutes.   Patient Denies nocturnal pain.  Difficulty dressing/grooming: Reports Difficulty climbing stairs: Reports Difficulty getting out of chair: Reports Difficulty using hands for taps, buttons, cutlery, and/or writing: Denies   Review of Systems  Constitutional: Negative for fatigue, night sweats and weakness ( ).  HENT: Positive for mouth dryness. Negative for mouth sores and nose dryness.   Eyes: Negative for redness and dryness.  Respiratory: Negative for shortness of breath and difficulty breathing.   Cardiovascular: Negative for chest pain, palpitations, hypertension, irregular heartbeat and swelling in legs/feet.  Gastrointestinal: Positive for constipation. Negative for  diarrhea.  Endocrine: Negative for increased urination.  Musculoskeletal: Positive for arthralgias, joint pain and morning stiffness. Negative for joint swelling, myalgias, muscle weakness, muscle tenderness and myalgias.  Skin: Negative for color change, rash, hair loss, nodules/bumps, skin tightness, ulcers and sensitivity to sunlight.  Allergic/Immunologic: Negative for susceptible to infections.  Neurological: Negative for dizziness, fainting, memory loss and night sweats.  Hematological: Negative for swollen glands.  Psychiatric/Behavioral: Negative for depressed mood and sleep disturbance. The patient is not nervous/anxious.     PMFS History:  Patient Active Problem List   Diagnosis Date Noted  . Primary osteoarthritis of right hip 06/02/2016  . Primary osteoarthritis of right knee 06/02/2016  . Urinary frequency 03/15/2016  . Pain in joint, shoulder region 03/15/2016  . Degenerative disc disease, lumbar 10/13/2015  . 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 Diabetic retinopathy (HCC) 10/04/2015  . Nocturia 06/10/2014  . Cataract 09/25/2012  . OSA (obstructive sleep apnea) 08/10/2012  . Eczema 08/10/2012  . Vitamin D deficiency 08/10/2012  . Erectile dysfunction 08/10/2012  . Personal history of adenomatous colonic polyps 04/08/2012  . Prostate cancer (HCC) 01/04/2012  . Diabetes type 2, controlled (HCC) 10/25/2011  . HTN (hypertension) 10/25/2011  . Hyperlipidemia LDL goal <70 10/25/2011  . Morbid obesity (HCC) 10/25/2011    Past Medical History:  Diagnosis Date  . Arthritis    left shoulder and left knee  . Diabetes mellitus without complication (HCC)   . Hyperlipidemia   . Hypertension   .   Nephrolithiasis   . Prostate cancer (HCC)     Family History  Problem Relation Age of Onset  . Cancer Mother     ovarian  . Alzheimer's disease Father   . Hypertension Sister   . Diabetes Brother   . Renal Disease Brother   . Heart disease Brother   .  Diabetes Daughter   . Seizures Daughter   . Cancer Sister     ?uterine  . Hypertension Sister   . Diabetes Sister   . Hyperlipidemia Sister   . Hypertension Sister   . Alcohol abuse Son   . Drug abuse Son     heroin, prescription opiates  . Colon cancer Neg Hx    Past Surgical History:  Procedure Laterality Date  . BACK SURGERY    . COLONOSCOPY N/A 07/17/2012   Procedure: COLONOSCOPY;  Surgeon: Carl E Gessner, MD;  Location: [facility];  Service: Endoscopy;  Laterality: N/A;  . PROSTATE SURGERY     with seed placement   Social History   Social History Narrative   Lives with his wife and their youngest son Junior. Has four sons and one daughter. Has 9 grandchildren.      Is a retired bus driver.      Objective: Vital Signs: BP (!) 142/81 (BP Location: Left Arm, Patient Position: Sitting)   Pulse 80   Resp 14   Ht 5' 4" (1.626 m)   Wt 283 lb (128.4 kg)   BMI 48.58 kg/m    Physical Exam  Constitutional: He is oriented to person, place, and time. He appears well-developed and well-nourished.  HENT:  Head: Normocephalic and atraumatic.  Eyes: Conjunctivae and EOM are normal. Pupils are equal, round, and reactive to light.  Neck: Normal range of motion. Neck supple.  Cardiovascular: Normal rate, regular rhythm and normal heart sounds.   Pulmonary/Chest: Effort normal and breath sounds normal.  Abdominal: Soft. Bowel sounds are normal.  Liver and spleen difficult to palpate due to morbid obesity  Neurological: He is alert and oriented to person, place, and time.  Skin: Skin is warm and dry. Capillary refill takes less than 2 seconds.  Psychiatric: He has a normal mood and affect. His behavior is normal.  Nursing note and vitals reviewed.    Musculoskeletal Exam: C-spine and thoracic spine and lumbar spine good range of motion. Shoulder joints good range of motion, some discomfort with range of motion of the left shoulder. elbow joints good range of motion, wrist  joints, MCPs PIPs DIPs with good range of motion with no swelling or synovitis. He has some swelling of his left third digit which he relates to an injury during childhood. He is painful limited range of motion of his right hip joint. Left hip joint was good range of motion. Bilateral knee joints are good range of motion with no warmth swelling or effusion. No tenderness in the Achilles tendon or plantar fascia.  CDAI Exam: No CDAI exam completed.    Investigation: Findings:  August 2016 HIV negative, March 2017 vitamin D 11, 03/21/2016 CMP glucose 137, UA negative, PSA normal, RF 23 elevated, CCP less than 16    Imaging: Xr Hand 2 View Left  Result Date: 06/02/2016 PIP, DIP narrowing. No MCP joint intercarpal joint space narrowing was noted. No chondrocalcinosis was noted. No erosive changes were noted. He has soft tissue swelling over the left third middle phalanx. Impression: These findings are consistent with osteoarthritis.  Xr Hand 2 View Right  Result Date:   06/02/2016 PIP and DIP narrowing noted. Mild spurring noted in the right third MCP joint. Mild CMC joint narrowing noted to intercarpal joint space narrowing noted no chondrocalcinosis, no erosive changes noted. Impression: These findings are consistent with osteoarthritis   Speciality Comments: No specialty comments available.    Procedures:  Large Joint Inj Date/Time: 06/02/2016 9:51 AM Performed by: ,  Authorized by: ,    Consent Given by:  Patient Site marked: the procedure site was marked   Timeout: prior to procedure the correct patient, procedure, and site was verified   Indications:  Pain Location:  Hip Site:  R greater trochanter Prep: patient was prepped and draped in usual sterile fashion   Needle Size:  27 G Needle Length:  1.5 inches Approach:  Lateral Ultrasound Guidance: No   Fluoroscopic Guidance: No   Arthrogram: No   Medications:  40 mg triamcinolone acetonide 40  MG/ML; 1.5 mL lidocaine 1 % Aspiration Attempted: No   Aspirate amount (mL):  0 Patient tolerance:  Patient tolerated the procedure well with no immediate complications   Allergies: Patient has no known allergies.   Assessment / Plan:     Visit Diagnoses: Polyarthritis with positive rheumatoid factor (HCC) - RF 23, CCP less than 16. Patient has no synovitis on examination.  Degenerative disc disease, lumbar: He has minimal lower back discomfort in the x-rays reviewed showed mild disc disease.  Right hip pain difficulty walking, decreased range of motion. X-rays reviewed showed mild osteoarthritic changes only.  Right trochanteric bursitis: After informed consent was obtained different treatment options were discussed right trochanteric area was prepped in sterile fashion and injected with cortisone as described above. He tolerated the procedure well. I would like to see response to the cortisone injection if he had persistent pain despite that I would consider doing MRI of his right hip joint.  Osteoarthritis of right knee mild. He has no warmth swelling or effusion on examination today.  Stiffness in hands and positive rheumatoid factor. I would obtain x-ray of bilateral hands to evaluate this further today.  His other medical problems are listed as follows for which she's seeing other physicians:  Essential hypertension  Hyperlipidemia LDL goal <70  Diabetes mellitus, advised him to monitor his blood pressure closely after cortisone injection  Morbid obesity (HCC)  OSA (obstructive sleep apnea)  Prostate cancer (HCC)  Vitamin D deficiency  Diabetic retinopathy    Orders: Orders Placed This Encounter  Procedures  . Large Joint Injection/Arthrocentesis  . XR Hand 2 View Right  . XR Hand 2 View Left  . Uric acid  . Angiotensin converting enzyme  . HLA-B27 antigen   No orders of the defined types were placed in this encounter.  Advised to him Tylenol for pain  management for right now. Face-to-face time spent with patient was 50 minutes. 50% of time was spent in counseling and coordination of care.  Follow-Up Instructions: Return for Osteoarthritis.    , MD  Note - This record has been created using Dragon software.  Chart creation errors have been sought, but may not always  have been located. Such creation errors do not reflect on  the standard of medical care. 

## 2016-06-02 ENCOUNTER — Telehealth: Payer: Self-pay | Admitting: Physician Assistant

## 2016-06-02 ENCOUNTER — Ambulatory Visit (INDEPENDENT_AMBULATORY_CARE_PROVIDER_SITE_OTHER): Payer: Self-pay

## 2016-06-02 ENCOUNTER — Encounter: Payer: Self-pay | Admitting: Physician Assistant

## 2016-06-02 ENCOUNTER — Ambulatory Visit (INDEPENDENT_AMBULATORY_CARE_PROVIDER_SITE_OTHER): Payer: Medicare Other | Admitting: Rheumatology

## 2016-06-02 ENCOUNTER — Encounter: Payer: Self-pay | Admitting: Rheumatology

## 2016-06-02 ENCOUNTER — Ambulatory Visit (INDEPENDENT_AMBULATORY_CARE_PROVIDER_SITE_OTHER): Payer: BC Managed Care – PPO

## 2016-06-02 VITALS — BP 142/81 | HR 80 | Resp 14 | Ht 64.0 in | Wt 283.0 lb

## 2016-06-02 DIAGNOSIS — E559 Vitamin D deficiency, unspecified: Secondary | ICD-10-CM | POA: Diagnosis not present

## 2016-06-02 DIAGNOSIS — E113213 Type 2 diabetes mellitus with mild nonproliferative diabetic retinopathy with macular edema, bilateral: Secondary | ICD-10-CM

## 2016-06-02 DIAGNOSIS — C61 Malignant neoplasm of prostate: Secondary | ICD-10-CM | POA: Diagnosis not present

## 2016-06-02 DIAGNOSIS — E119 Type 2 diabetes mellitus without complications: Secondary | ICD-10-CM | POA: Diagnosis not present

## 2016-06-02 DIAGNOSIS — M083 Juvenile rheumatoid polyarthritis (seronegative): Secondary | ICD-10-CM | POA: Diagnosis not present

## 2016-06-02 DIAGNOSIS — M1711 Unilateral primary osteoarthritis, right knee: Secondary | ICD-10-CM

## 2016-06-02 DIAGNOSIS — M1611 Unilateral primary osteoarthritis, right hip: Secondary | ICD-10-CM | POA: Diagnosis not present

## 2016-06-02 DIAGNOSIS — I10 Essential (primary) hypertension: Secondary | ICD-10-CM | POA: Diagnosis not present

## 2016-06-02 DIAGNOSIS — M5136 Other intervertebral disc degeneration, lumbar region: Secondary | ICD-10-CM | POA: Diagnosis not present

## 2016-06-02 DIAGNOSIS — M7061 Trochanteric bursitis, right hip: Secondary | ICD-10-CM

## 2016-06-02 DIAGNOSIS — E785 Hyperlipidemia, unspecified: Secondary | ICD-10-CM | POA: Diagnosis not present

## 2016-06-02 DIAGNOSIS — G4733 Obstructive sleep apnea (adult) (pediatric): Secondary | ICD-10-CM | POA: Diagnosis not present

## 2016-06-02 DIAGNOSIS — M0580 Other rheumatoid arthritis with rheumatoid factor of unspecified site: Secondary | ICD-10-CM

## 2016-06-02 MED ORDER — TRIAMCINOLONE ACETONIDE 40 MG/ML IJ SUSP
40.0000 mg | INTRAMUSCULAR | Status: AC | PRN
  Administered 2016-06-02: 40 mg via INTRA_ARTICULAR

## 2016-06-02 MED ORDER — LIDOCAINE HCL 1 % IJ SOLN
1.5000 mL | INTRAMUSCULAR | Status: AC | PRN
  Administered 2016-06-02: 1.5 mL

## 2016-06-02 NOTE — Telephone Encounter (Signed)
Received a second fax from [place] requesting CPAP supplies. I still need the info for the form regarding the supplies he needs. Will send the patient a message in My Chart.

## 2016-06-03 ENCOUNTER — Encounter: Payer: Self-pay | Admitting: Physician Assistant

## 2016-06-03 LAB — ANGIOTENSIN CONVERTING ENZYME: Angiotensin-Converting Enzyme: 14 U/L (ref 9–67)

## 2016-06-03 LAB — URIC ACID: Uric Acid, Serum: 7.8 mg/dL (ref 4.0–8.0)

## 2016-06-04 ENCOUNTER — Encounter: Payer: Self-pay | Admitting: Physician Assistant

## 2016-06-06 ENCOUNTER — Encounter: Payer: Self-pay | Admitting: Physician Assistant

## 2016-06-06 ENCOUNTER — Other Ambulatory Visit: Payer: Self-pay | Admitting: Physician Assistant

## 2016-06-06 MED ORDER — COLCHICINE 0.6 MG PO TABS
0.6000 mg | ORAL_TABLET | Freq: Two times a day (BID) | ORAL | 0 refills | Status: DC
Start: 2016-06-06 — End: 2017-02-28

## 2016-06-06 NOTE — Telephone Encounter (Signed)
Patient notified via My Chart.  Meds ordered this encounter  Medications  . colchicine 0.6 MG tablet    Sig: Take 1 tablet (0.6 mg total) by mouth 2 (two) times daily.    Dispense:  6 tablet    Refill:  0    Order Specific Question:   Supervising Provider    Answer:   Brigitte Pulse, EVA N [[id number]]

## 2016-06-07 ENCOUNTER — Encounter: Payer: Self-pay | Admitting: Physician Assistant

## 2016-06-08 ENCOUNTER — Encounter: Payer: Self-pay | Admitting: Physician Assistant

## 2016-06-08 LAB — HLA-B27 ANTIGEN: DNA Result:: NEGATIVE

## 2016-06-09 ENCOUNTER — Telehealth: Payer: Self-pay | Admitting: *Deleted

## 2016-06-09 ENCOUNTER — Encounter: Payer: Self-pay | Admitting: Neurology

## 2016-06-09 ENCOUNTER — Ambulatory Visit (INDEPENDENT_AMBULATORY_CARE_PROVIDER_SITE_OTHER): Payer: Medicare Other | Admitting: Neurology

## 2016-06-09 ENCOUNTER — Encounter: Payer: Self-pay | Admitting: Rheumatology

## 2016-06-09 VITALS — BP 122/68 | HR 76 | Resp 18 | Ht 64.0 in | Wt 278.0 lb

## 2016-06-09 DIAGNOSIS — G4733 Obstructive sleep apnea (adult) (pediatric): Secondary | ICD-10-CM | POA: Diagnosis not present

## 2016-06-09 DIAGNOSIS — R400 Somnolence: Secondary | ICD-10-CM | POA: Diagnosis not present

## 2016-06-09 DIAGNOSIS — R351 Nocturia: Secondary | ICD-10-CM | POA: Diagnosis not present

## 2016-06-09 NOTE — Progress Notes (Deleted)
Subjective:    Patient ID: Rodney Walsh is a 66 y.o. male.  HPI {Common ambulatory SmartLinks:[id number]}  Review of Systems  Neurological:       Patient has trouble staying asleep, gets up many times to go to the bathroom, snores, witnessed apnea.   Had sleep study about 15 years ago, placed on CPAP, continues to use CPAP.  Needs new CPAP supplies.  DME: [facility]   Epworth Sleepiness Scale 0= would never doze 1= slight chance of dozing 2= moderate chance of dozing 3= high chance of dozing  Sitting and reading:1 Watching TV:1 Sitting inactive in a public place (ex. Theater or meeting):2 As a passenger in a car for an hour without a break:0 Lying down to rest in the afternoon:0 Sitting and talking to someone:0 Sitting quietly after lunch (no alcohol):3 In a car, while stopped in traffic:0 Total:7  Objective:  Neurologic Exam  Physical Exam  Assessment:   ***  Plan:   ***

## 2016-06-09 NOTE — Telephone Encounter (Signed)
Patient states he has not had any relief in his hip. 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 Patient states he is taking Colchicine BID for the Gout and seems to be helping. Patient has not been able to get any relief from his hip. Wants to know if we have any suggestions. He did receive an injection when he was in the office on 06/02/16.

## 2016-06-09 NOTE — Patient Instructions (Signed)
Based on your symptoms and you are still at risk for obstructive sleep apnea or OSA, and I think we should proceed with a sleep study to determine whether you do or do not have OSA and how severe it is. If you have more than mild OSA, I want you to consider treatment with CPAP. Please remember, the risks and ramifications of moderate to severe obstructive sleep apnea or OSA are: Cardiovascular disease, including congestive heart failure, stroke, difficult to control hypertension, arrhythmias, and even type 2 diabetes has been linked to untreated OSA. Sleep apnea causes disruption of sleep and sleep deprivation in most cases, which, in turn, can cause recurrent headaches, problems with memory, mood, concentration, focus, and vigilance. Most people with untreated sleep apnea report excessive daytime sleepiness, which can affect their ability to drive. Please do not drive if you feel sleepy.   I will likely see you back after your sleep study to go over the test results and where to go from there. We will call you after your sleep study to advise about the results (most likely, you will hear from Beverlee Nims, my nurse) and to set up an appointment at the time, as necessary.    Our sleep lab administrative assistant, Arrie Aran will meet with you or call you to schedule your sleep study. If you don't hear back from her by next week please feel free to call her at [phone]. This is her direct line and please leave a message with your phone number to call back if you get the voicemail box. She will call back as soon as possible.

## 2016-06-09 NOTE — Progress Notes (Signed)
Subjective:    Patient ID: Rodney Walsh is a 66 y.o. male.  HPI     Star Age, MD, PhD [facility] [address]O. [place], [place] [address]  Dear [name],   I saw your patient, [name], upon your kind request in my neurologic clinic today for initial consultation of his sleep disorder, in particular reevaluation of his prior diagnosis of OSA. The patient is unaccompanied today. As you know, [name] is a 66 year old right-handed gentleman with an underlying medical history of joint pain, degenerative disc disease, diabetes with retinopathy, nocturia, urinary frequency, cataracts, eczema, vitamin D deficiency, ED, prostate cancer, hypertension, hyperlipidemia, and morbid obesity who was previously diagnosed with obstructive sleep apnea several years ago in place on CPAP therapy. Prior sleep study results are not available for my review today. A CPAP compliance download is not available for my review today. I reviewed your office note from 03/15/2016. He was recently seen by rheumatology on 06/02/2016. His Epworth sleepiness score is 7 out of 24 today, his fatigue score is 39 out of 63. He reports that he had a sleep study about 15 years ago. He is retired. He lives with his wife. He has 5 grown children. He quit smoking in 2008 and does not currently drink any alcohol. He drinks 2 cups of coffee per day.  He had a recent gout flare, currently on colchicine. Had recent hip injection and it may have helped. Has had prior injections to L knee and R knee. All five kids are in [place], 9 GC. Retired Recruitment consultant, city bus and school bus. Wife cannot sleep in the same BR, d/t snoring and apneas. He has frequent nocturia, about 3-4 per night. No AM HAs. No Sx of neuropathy.  He has not used his CPAP for about 5 years, needs supplies and is a mouth breather. He had a ResMed Ultra Mirage FFM L, and a chinstrap. Has an old RedMed Compact CPAP machine. His  bedtime is between 11 PM and midnight, wakeup time varies.   His Past Medical History Is Significant For: Past Medical History:  Diagnosis Date  . Arthritis    left shoulder and left knee  . Diabetes mellitus without complication ([facility])   . Hyperlipidemia   . Hypertension   . Nephrolithiasis   . Prostate cancer [facility])     His Past Surgical History Is Significant For: Past Surgical History:  Procedure Laterality Date  . BACK SURGERY    . COLONOSCOPY N/A 07/17/2012   Procedure: COLONOSCOPY;  Surgeon: Gatha Mayer, MD;  Location: [facility]COPY;  Service: Endoscopy;  Laterality: N/A;  . PROSTATE SURGERY     with seed placement    His Family History Is Significant For: Family History  Problem Relation Age of Onset  . Cancer Mother     ovarian  . Alzheimer's disease Father   . Hypertension Sister   . Diabetes Brother   . Renal Disease Brother   . Heart disease Brother   . Diabetes Daughter   . Seizures Daughter   . Cancer Sister     ?uterine  . Hypertension Sister   . Diabetes Sister   . Hyperlipidemia Sister   . Hypertension Sister   . Alcohol abuse Son   . Drug abuse Son     heroin, prescription opiates  . Colon cancer Neg Hx     His Social History Is Significant For: Social History   Social History  .  Marital status: Married    Spouse name: Deb  . Number of children: 5  . Years of education: 12   Occupational History  . [employer]    Retired    Social History Main Topics  . Smoking status: Former Smoker    Packs/day: 1.00    Years: 45.00    Types: Cigarettes  . Smokeless tobacco: Never Used     Comment: Quit in 2008  . Alcohol use No  . Drug use: No  . Sexual activity: Yes    Partners: Female   Other Topics Concern  . None   Social History Narrative   Lives with his wife and their youngest son Paramedic. Has four sons and one daughter. Has 9 grandchildren.      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    Drinks 2 cups of coffee a day      His Allergies Are:  No Known Allergies:   His Current Medications Are:  Outpatient Encounter Prescriptions as of 06/09/2016  Medication Sig  . aspirin 81 MG tablet Take 81 mg by mouth daily.  . Blood Glucose Monitoring Suppl (ONE TOUCH ULTRA SYSTEM KIT) w/Device KIT Use to test blood sugar daily.  . clobetasol ointment (TEMOVATE) 3.64 % Apply 1 application topically 2 (two) times daily. To the lesion on the LEG.  . colchicine 0.6 MG tablet Take 1 tablet (0.6 mg total) by mouth 2 (two) times daily.  Marland Kitchen gabapentin (NEURONTIN) 600 MG tablet Take 300 mg by mouth 4 (four) times daily.  Marland Kitchen glucosamine-chondroitin 500-400 MG tablet Take 1 tablet by mouth 3 (three) times daily.  Marland Kitchen lisinopril (PRINIVIL,ZESTRIL) 20 MG tablet Take 1 tablet (20 mg total) by mouth daily.  . meloxicam (MOBIC) 15 MG tablet TAKE ONE TABLET BY MOUTH ONCE DAILY  . metFORMIN (GLUCOPHAGE) 1000 MG tablet TAKE ONE TABLET BY MOUTH TWICE DAILY WITH  A  MEAL.  . Multiple Vitamin (MULTIVITAMIN) tablet Take 1 tablet by mouth daily.  Marland Kitchen oxybutynin (DITROPAN) 5 MG tablet Take 1 tablet (5 mg total) by mouth 2 (two) times daily.  . Vitamin D, Ergocalciferol, (DRISDOL) 50000 units CAPS capsule Take 1 capsule (50,000 Units total) by mouth every 7 (seven) days.  . [DISCONTINUED] glucose blood test strip Use to test blood sugar daily.  . [DISCONTINUED] cyclobenzaprine (FLEXERIL) 10 MG tablet Take 1 tablet (10 mg total) by mouth 3 (three) times daily as needed for muscle spasms. (Patient not taking: Reported on 03/15/2016)   No facility-administered encounter medications on file as of 06/09/2016.   :  Review of Systems:  Out of a complete 14 point review of systems, all are reviewed and negative with the exception of these symptoms as listed below: Review of Systems  Neurological:       Patient had a sleep study about 15 years ago. He was prescribed CPAP and is still using it. Needs new supplies. DME: [facility]  Patient has trouble staying  asleep, snores, witnessed apnea.    Epworth Sleepiness Scale 0= would never doze 1= slight chance of dozing 2= moderate chance of dozing 3= high chance of dozing  Sitting and reading:1 Watching TV:1 Sitting inactive in a public place (ex. Theater or meeting):2 As a passenger in a car for an hour without a break:0 Lying down to rest in the afternoon:0 Sitting and talking to someone:0 Sitting quietly after lunch (no alcohol):3 In a car, while stopped in traffic:0 Total:7 Objective:  Neurologic Exam  Physical Exam Physical Examination:  Vitals:   06/09/16 1131  BP: 122/68  Pulse: 76  Resp: 18    General Examination: The patient is a very pleasant 66 y.o. male in no acute distress. He appears well-developed and well-nourished and adequately groomed.   HEENT: Normocephalic, atraumatic, pupils are equal, round and reactive to light and accommodation. Funduscopic exam is normal with sharp disc margins noted. Extraocular tracking is good without limitation to gaze excursion or nystagmus noted. Normal smooth pursuit is noted. Hearing is grossly intact. Tympanic membranes are clear bilaterally. Face is symmetric with normal facial animation and normal facial sensation. Speech is clear with no dysarthria noted. There is no hypophonia. There is no lip, neck/head, jaw or voice tremor. Neck is supple with full range of passive and active motion. There are no carotid bruits on auscultation. Oropharynx exam reveals: moderate mouth dryness, poor dental hygiene and dentures on top and missing teeth on bottom. He has moderate to significant airway crowding, due to tonsils in place, larger tongue, large uvula and thicker soft palate, Mallampati is class III. Neck circumference is 21 inches. Tongue protrudes centrally and palate elevates symmetrically.  Chest: Clear to auscultation without wheezing, rhonchi or crackles noted.  Heart: S1+S2+0, regular and normal without murmurs, rubs or gallops noted.    Abdomen: Soft, non-tender and non-distended with normal bowel sounds appreciated on auscultation.  Extremities: There is trace pitting edema in the distal lower extremities bilaterally. Pedal pulses are intact.  Skin: Warm and dry without trophic changes noted. There are no varicose veins.  Musculoskeletal: exam reveals no obvious joint deformities, tenderness or joint swelling or erythema.   Neurologically:  Mental status: The patient is awake, alert and oriented in all 4 spheres. His immediate and remote memory, attention, language skills and fund of knowledge are appropriate. There is no evidence of aphasia, agnosia, apraxia or anomia. Speech is clear with normal prosody and enunciation. Thought process is linear. Mood is normal and affect is normal.  Cranial nerves II - XII are as described above under HEENT exam. In addition: shoulder shrug is normal with equal shoulder height noted. Motor exam: Normal bulk, strength and tone is noted. There is no drift, tremor or rebound. Romberg is negative. Reflexes are 1+ throughout. Fine motor skills and coordination: intact with normal finger taps, normal hand movements, normal rapid alternating patting, normal foot taps and normal foot agility.  Cerebellar testing: No dysmetria or intention tremor on finger to nose testing. Heel to shin is possible on the left and not possible on the right due to hip pain and knee pain.  Sensory exam: intact to light touch, pinprick, vibration, temperature sense in the upper and lower extremities.  Gait, station and balance: He stands with difficulty, has to push himself up, walks with a limp and walks slowly and cautiously, has a walking stick. Tandem walk is not possible.               Assessment and Plan:  In summary, DANTON PALMATEER is a very pleasant 66 y.o.-year old male with an underlying medical history of joint pain, degenerative disc disease, diabetes with retinopathy, nocturia, urinary frequency,  cataracts, eczema, vitamin D deficiency, ED, prostate cancer, hypertension, hyperlipidemia, and morbid obesity who was previously diagnosed with obstructive sleep apnea several years ago, but  has not used CPAP in about 5 years. He has an older machine and old CPAP supplies. He had been using a fullface mask with a chinstrap, he has a history of mouth breathing. He  was told that he had severe obstructive sleep apnea and needs reevaluation.  I had a long chat with the patient and his wife about my findings and the diagnosis of OSA, its prognosis and treatment options. We talked about medical treatments, surgical interventions and non-pharmacological approaches. I explained in particular the risks and ramifications of untreated moderate to severe OSA, especially with respect to developing cardiovascular disease down the Road, including congestive heart failure, difficult to treat hypertension, cardiac arrhythmias, or stroke. Even type 2 diabetes has, in part, been linked to untreated OSA. Symptoms of untreated OSA include daytime sleepiness, memory problems, mood irritability and mood disorder such as depression and anxiety, lack of energy, as well as recurrent headaches, especially morning headaches. We talked about trying to maintain a healthy lifestyle in general, as well as the importance of weight control. I encouraged the patient to eat healthy, exercise daily and keep well hydrated, to keep a scheduled bedtime and wake time routine, to not skip any meals and eat healthy snacks in between meals. I advised the patient not to drive when feeling sleepy. I recommended the following at this time: sleep study with potential positive airway pressure titration. (We will score hypopneas at 4%).   I explained the sleep test procedure to the patient and also outlined possible surgical and non-surgical treatment options of OSA, including the use of a custom-made dental device (which would require a referral to a  specialist dentist or oral surgeon), upper airway surgical options, such as pillar implants, radiofrequency surgery, tongue base surgery, and UPPP (which would involve a referral to an ENT surgeon). Rarely, jaw surgery such as mandibular advancement may be considered.  I also explained the CPAP treatment option to the patient, who indicated that he would be willing to try CPAP if the need arises. I explained the importance of being compliant with PAP treatment, not only for insurance purposes but primarily to improve His symptoms, and for the patient's long term health benefit, including to reduce His cardiovascular risks. I answered all their questions today and the patient and his wife were in agreement. I would like to see him back after the sleep study is completed and encouraged him to call with any interim questions, concerns, problems or updates.   Thank you very much for allowing me to participate in the care of this nice patient. If I can be of any further assistance to you please do not hesitate to call me at [phone].  Sincerely,   Star Age, MD, PhD

## 2016-06-09 NOTE — Telephone Encounter (Signed)
  OK to Prescribe  Prednisone 5mg :  4po qAM x 4 days,  3po qAM x 4 days, 2po qAM x 4 days,  1po qAM x 4 days, 1/2po qAM x 4 days, then stop.; disp 42 pills w/ no refills.  Take until all gone and exactly as Rx'd. If diabetic, watch sugar levels and have pcp make adjustments of diabetes medicines for better control if needed.  Keep f-u appt w/ dr d in mid march as scheduled already

## 2016-06-10 MED ORDER — PREDNISONE 5 MG PO TABS: ORAL_TABLET | ORAL | 0 refills | Status: DC

## 2016-06-10 NOTE — Telephone Encounter (Signed)
Patient advised of recommendation and prescription sent to the pharmacy . 

## 2016-06-13 ENCOUNTER — Encounter: Payer: Self-pay | Admitting: Physician Assistant

## 2016-06-19 ENCOUNTER — Ambulatory Visit (INDEPENDENT_AMBULATORY_CARE_PROVIDER_SITE_OTHER): Payer: Medicare Other | Admitting: Neurology

## 2016-06-19 DIAGNOSIS — G4733 Obstructive sleep apnea (adult) (pediatric): Secondary | ICD-10-CM | POA: Diagnosis not present

## 2016-06-19 DIAGNOSIS — G4720 Circadian rhythm sleep disorder, unspecified type: Secondary | ICD-10-CM

## 2016-06-23 ENCOUNTER — Other Ambulatory Visit: Payer: Self-pay | Admitting: Physician Assistant

## 2016-06-23 ENCOUNTER — Telehealth: Payer: Self-pay

## 2016-06-23 DIAGNOSIS — E559 Vitamin D deficiency, unspecified: Secondary | ICD-10-CM

## 2016-06-23 NOTE — Telephone Encounter (Signed)
I called pt. I advised him of his sleep study results. Pt is agreeable to coming in for a cpap titration as long as insurance covers it. I advised him that our sleep lab will call him to schedule the study. Pt verbalized understanding of results. Pt had no questions at this time but was encouraged to call back if questions arise.

## 2016-06-23 NOTE — Procedures (Signed)
PATIENT'S NAME:  Rodney Walsh, Rodney Walsh DOB:      [birth year]      MR#:    [id number]     DATE OF RECORDING: 06/19/2016 REFERRING M.D.:  [name] MD Study Performed:   Baseline Polysomnogram HISTORY: 66 year old man with a history of joint pain, degenerative disc disease, diabetes with retinopathy, nocturia, urinary frequency, cataracts, eczema, vitamin D deficiency, ED, prostate cancer, hypertension, hyperlipidemia, and morbid obesity who was previously diagnosed with obstructive sleep apnea several years ago and placed on CPAP therapy. Epworth sleepiness score is 7 out of 24, fatigue score is 39 out of 63.  He has not used his CPAP for about 5 years, needs supplies and is a mouth breather. The patient's weight 278 pounds with a height of 64 (inches), resulting in a BMI of 47.4 kg/m2. The patient's neck circumference measured 21 inches.  CURRENT MEDICATIONS: Gabapentin, Glucsoamine-Chondroitin, Lisinopril, Meloxicam, Metformin, Multi-vitamin, oxybutynin and Vitamin D   PROCEDURE:  This is a multichannel digital polysomnogram utilizing the Somnostar 11.2 system.  Electrodes and sensors were applied and monitored per AASM Specifications.   EEG, EOG, Chin and Limb EMG, were sampled at 200 Hz.  ECG, Snore and Nasal Pressure, Thermal Airflow, Respiratory Effort, CPAP Flow and Pressure, Oximetry was sampled at 50 Hz. Digital video and audio were recorded.      BASELINE STUDY  Lights Out was at 21:01 and Lights On at 05:15.  Total recording time (TRT) was 494.5 minutes, with a total sleep time (TST) of 343 minutes.   The patient's sleep latency was 83.5 minutes, which is prolonged.  REM latency was 207 minutes, which is prolonged.  The sleep efficiency was 69.4%, which is reduced.     SLEEP ARCHITECTURE: WASO (Wake after sleep onset) was 77.5 minutes with moderate sleep fragmentation noted.  There were 17.5 minutes in Stage N1, 268 minutes Stage N2, 0 minutes Stage N3 and 57.5 minutes in Stage REM.  The  percentage of Stage N1 was 5.1%, Stage N2 was 78.1%, which is increased, Stage N3 was absent, and Stage R (REM sleep) was 16.8%.   The arousals were noted as: 43 were spontaneous, 0 were associated with PLMs, 94 were associated with respiratory events.    Audio and video analysis did not show any abnormal or unusual movements, behaviors, phonations or vocalizations.   The patient took 3 bathroom breaks. Moderate snoring was noted. The EKG was in keeping with normal sinus rhythm (NSR).  RESPIRATORY ANALYSIS:  There were a total of 94 respiratory events:  18 obstructive apneas, 0 central apneas and 0 mixed apneas with a total of 18 apneas and an apnea index (AI) of 3.1 /hour. There were 76 hypopneas with a hypopnea index of 13.3 /hour. The patient also had 0 respiratory event related arousals (RERAs).      The total APNEA/HYPOPNEA INDEX (AHI) was 16.4/hour and the total RESPIRATORY DISTURBANCE INDEX was 16.4 /hour.  45 events occurred in REM sleep and 74 events in NREM. The REM AHI was 47. /hour, versus a non-REM AHI of 10.3. The patient spent 297.5 minutes of total sleep time in the supine position and 46 minutes in non-supine.. The supine AHI was 16.1 versus a non-supine AHI of 18.4.  OXYGEN SATURATION & C02:  The Wake baseline 02 saturation was 95%, with the lowest being 72%. Time spent below 89% saturation equaled 48 minutes.  PERIODIC LIMB MOVEMENTS:   The patient had a total of 0 Periodic Limb Movements.  The Periodic Limb Movement (  PLM) index was 0 and the PLM Arousal index was 0/hour.  Post-study, the patient indicated that sleep was the same as usual.   IMPRESSION: 1. Obstructive Sleep Apnea (OSA) 2. Dysfunctions associated with sleep stages or arousal from sleep  RECOMMENDATIONS: 1. This overnight polysomnogram demonstrates moderate to severe obstructive sleep apnea (OSA) and treatment with positive airway pressure (in the form of CPAP or BiPAP) is recommended. This will require a full  night CPAP titration study for proper treatment settings and mask fitting. Adjunct and alternative treatment options may include: avoidance of the supine sleep position, weight loss, an oral appliance (aka dental device, custom made by a specialized dentist usually), or upper airway or jaw surgery (not usually first line treatments).  2. Please note that untreated obstructive sleep apnea carries additional perioperative morbidity. Patients with significant obstructive sleep apnea should receive perioperative PAP therapy and the surgeons and particularly the anesthesiologist should be informed of the diagnosis and the severity of the sleep disordered breathing. 3. The patient should be cautioned not to drive, work at heights, or operate dangerous or heavy equipment when tired or sleepy. Review and reiteration of good sleep hygiene measures should be pursued with any patient. 4. This study shows sleep fragmentation and abnormal sleep stage percentages; these are nonspecific findings and per se do not signify an intrinsic sleep disorder or a cause for the patient's sleep-related symptoms. Causes include (but are not limited to) the first night effect of the sleep study, circadian rhythm disturbances, medication effect or an underlying mood disorder or medical problem.  5. The patient will be seen in follow-up by Dr. Rexene Alberts at [facility] for discussion of the test results and further management strategies. The referring provider will be notified of the test results.  I certify that I have reviewed the entire raw data recording prior to the issuance of this report in accordance with the Standards of Accreditation of the American Academy of Sleep Medicine (AASM)   Star Age, MD, PhD Diplomat, American Board of Psychiatry and Neurology (Neurology and Sleep Medicine)

## 2016-06-23 NOTE — Addendum Note (Signed)
Addended by: Star Age on: 06/23/2016 08:19 AM   Modules accepted: Orders

## 2016-06-23 NOTE — Telephone Encounter (Signed)
-----   Message from Star Age, MD sent at 06/23/2016  8:19 AM EST ----- Patient referred by Rodney Walsh, seen by me on 06/09/16, diagnostic PSG on 06/19/16.    Please call and notify the patient that the recent sleep study did confirm the diagnosis of mod to severe obstructive sleep apnea and that I recommend treatment for this in the form of CPAP. This will require a repeat sleep study for proper titration and mask fitting. Please explain to patient and arrange for a CPAP titration study. I have placed an order in the chart. Thanks, and please route to [facility] for scheduling next sleep study.  Star Age, MD, PhD [facility])

## 2016-06-23 NOTE — Progress Notes (Signed)
Patient referred by [name], seen by me on 06/09/16, diagnostic PSG on 06/19/16.    Please call and notify the patient that the recent sleep study did confirm the diagnosis of mod to severe obstructive sleep apnea and that I recommend treatment for this in the form of CPAP. This will require a repeat sleep study for proper titration and mask fitting. Please explain to patient and arrange for a CPAP titration study. I have placed an order in the chart. Thanks, and please route to [facility] for scheduling next sleep study.  Star Age, MD, PhD [facility])

## 2016-06-30 ENCOUNTER — Other Ambulatory Visit: Payer: Self-pay | Admitting: Physician Assistant

## 2016-07-07 ENCOUNTER — Ambulatory Visit: Payer: Medicare Other | Admitting: Rheumatology

## 2016-07-07 ENCOUNTER — Ambulatory Visit (INDEPENDENT_AMBULATORY_CARE_PROVIDER_SITE_OTHER): Payer: Medicare Other | Admitting: Neurology

## 2016-07-07 DIAGNOSIS — G4720 Circadian rhythm sleep disorder, unspecified type: Secondary | ICD-10-CM

## 2016-07-07 DIAGNOSIS — G4733 Obstructive sleep apnea (adult) (pediatric): Secondary | ICD-10-CM | POA: Diagnosis not present

## 2016-07-11 NOTE — Progress Notes (Signed)
Office Visit Note  Patient: Rodney Walsh             Date of Birth: [birth year]           MRN: [id number]             PCP: [name], PA-C Referring: [name], PA-C Visit Date: 07/20/2016 Occupation: '@GUAROCC' @    Subjective:  Right hip pain   History of Present Illness: Rodney Walsh is a 66 y.o. male with history of polyarthralgia and positive rheumatoid factor. He returns for follow-up visit today he states the right trochanteric bursa injection helped him for short period now the right hip is hurting again. He also has some stiffness in his hands. He has not is some swelling in his right knee joint for the last 3-4 days.  Activities of Daily Living:  Patient reports morning stiffness for 30 minutes.   Patient Reports nocturnal pain.  Difficulty dressing/grooming: Denies Difficulty climbing stairs: Reports Difficulty getting out of chair: Reports Difficulty using hands for taps, buttons, cutlery, and/or writing: Denies   Review of Systems  Constitutional: Positive for fatigue. Negative for night sweats and weakness ( ).  HENT: Negative for mouth sores, mouth dryness and nose dryness.   Eyes: Negative for redness and dryness.  Respiratory: Negative for shortness of breath and difficulty breathing.   Cardiovascular: Negative for chest pain, palpitations, hypertension, irregular heartbeat and swelling in legs/feet.  Gastrointestinal: Negative for constipation and diarrhea.  Endocrine: Negative for increased urination.  Musculoskeletal: Positive for arthralgias, joint pain and morning stiffness. Negative for joint swelling, myalgias, muscle weakness, muscle tenderness and myalgias.  Skin: Negative for color change, rash, hair loss, nodules/bumps, skin tightness, ulcers and sensitivity to sunlight.  Allergic/Immunologic: Negative for susceptible to infections.  Neurological: Negative for dizziness, fainting, memory loss and night sweats.  Hematological: Negative  for swollen glands.  Psychiatric/Behavioral: Positive for depressed mood and sleep disturbance. The patient is not nervous/anxious.     PMFS History:  Patient Active Problem List   Diagnosis Date Noted  . Primary osteoarthritis of right hip 06/02/2016  . Primary osteoarthritis of right knee 06/02/2016  . Urinary frequency 03/15/2016  . Pain in joint, shoulder region 03/15/2016  . Degenerative disc disease, lumbar 10/13/2015  . 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 Diabetic retinopathy ([place]) 10/04/2015  . Nocturia 06/10/2014  . Cataract 09/25/2012  . OSA (obstructive sleep apnea) 08/10/2012  . Eczema 08/10/2012  . Vitamin D deficiency 08/10/2012  . Erectile dysfunction 08/10/2012  . Personal history of adenomatous colonic polyps 04/08/2012  . Prostate cancer ([place]) 01/04/2012  . Diabetes type 2, controlled ([place]) 10/25/2011  . HTN (hypertension) 10/25/2011  . Hyperlipidemia LDL goal <70 10/25/2011  . Morbid obesity ([place]) 10/25/2011    Past Medical History:  Diagnosis Date  . Arthritis    left shoulder and left knee  . Diabetes mellitus without complication ([place])   . Hyperlipidemia   . Hypertension   . Nephrolithiasis   . Prostate cancer [facility])     Family History  Problem Relation Age of Onset  . Cancer Mother     ovarian  . Alzheimer's disease Father   . Hypertension Sister   . Diabetes Brother   . Renal Disease Brother   . Heart disease Brother   . Diabetes Daughter   . Seizures Daughter   . Cancer Sister     ?uterine  . Hypertension Sister   . Diabetes Sister   .  Hyperlipidemia Sister   . Hypertension Sister   . Alcohol abuse Son   . Drug abuse Son     heroin, prescription opiates  . Colon cancer Neg Hx    Past Surgical History:  Procedure Laterality Date  . BACK SURGERY    . COLONOSCOPY N/A 07/17/2012   Procedure: COLONOSCOPY;  Surgeon: Gatha Mayer, MD;  Location: [facility];  Service: Endoscopy;  Laterality: N/A;  . PROSTATE  SURGERY     with seed placement   Social History   Social History Narrative   Lives with his wife and their youngest son Paramedic. Has four sons and one daughter. Has 9 grandchildren.      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    Drinks 2 cups of coffee a day      Objective: Vital Signs: BP 138/78   Pulse 88   Resp 18   Wt 278 lb (126.1 kg)   BMI 47.72 kg/m    Physical Exam  Constitutional: He is oriented to person, place, and time. He appears well-developed and well-nourished.  HENT:  Head: Normocephalic and atraumatic.  Eyes: Conjunctivae and EOM are normal. Pupils are equal, round, and reactive to light.  Neck: Normal range of motion. Neck supple.  Cardiovascular: Normal rate, regular rhythm and normal heart sounds.   Pulmonary/Chest: Effort normal and breath sounds normal.  Abdominal: Soft. Bowel sounds are normal.  Neurological: He is alert and oriented to person, place, and time.  Skin: Skin is warm and dry. Capillary refill takes less than 2 seconds.  Psychiatric: He has a normal mood and affect. His behavior is normal.  Nursing note and vitals reviewed.    Musculoskeletal Exam: C-spine and thoracic spine and lumbar spine good range of motion. He had a lot of difficulty getting up from chair to the exam table due to discomfort in his right hip. Shoulder joints painful range of motion. Elbow joints are good range of motion without any synovitis. Wrists joint MCPs PIPs with good range of motion with no synovitis on examination. He had no tenderness on palpation. Right hip joint was decreased range of motion. Bilateral knee joints good range of motion with no warmth swelling or effusion. Ankles and MTPs were good range of motion with no synovitis.  CDAI Exam: No CDAI exam completed.    Investigation: No additional findings. Office Visit on 06/02/2016  Component Date Value Ref Range Status  . Uric Acid, Serum 06/02/2016 7.8  4.0 - 8.0 mg/dL Final  . Angiotensin-Converting Enzyme  06/02/2016 14  9 - 67 U/L Final   Comment: ** Please note change in reference range(s). **     . DNA Result: 06/02/2016 Negative  Negative Final  . Results reviewed by: 06/02/2016 REPORT   Final   Comment: Forestine Na, Ph.D.,[facility] Director, Molecular Genetics The B27 allele group of the HLA-B locus is present in 2 to 9% of the general population.  About 20% of HLA-B27 carriers develop autoimmune disorders including Ankylosing Spondylitis (AS), Reactive Arthritis, Psoriatic Arthritis, Undifferentiated Oligoarthritis, Uveitis, or Inflammatory Bowel Disease. The highest association is with AS, where approximately 95% of AS patients are HLA-B27 positive. Genetic counseling as needed. Typing performed by PCR and hybridization with sequence specific oligonucleotide probes (SSO) using the FDA-cleared LABType(R) SSO Kit.     Imaging: [place] Extrem Up Bilat Comp  Result Date: 07/20/2016 Ultrasound examination of bilateral hands was performed per EULAR recommendations. Using 12 MHz transducer, grayscale and power Doppler bilateral second, third, and fifth MCP  joints and bilateral wrist joints both dorsal and volar aspects were evaluated to look for synovitis or tenosynovitis. The findings were there was no synovitis or tenosynovitis on ultrasound examination. Right median nerve was 0.14 cm squares which was within normal limits and left median nerve was 0.14 cm squares which was within normal limits. Impression: Ultrasound examination was within normal limits without significant synovitis or tenosynovitis. Bilateral median nerves were also within normal limits.   Speciality Comments: No specialty comments available.    Procedures:  No procedures performed Allergies: Patient has no known allergies.   Assessment / Plan:     Visit Diagnoses: Polyarthralgia - RF 23, CCP negative. He has no synovitis on examination. The ultrasound examination done today showed no synovitis or  tenosynovitis.  Primary osteoarthritis of right hip - Mild. He has severe pain in his right hip joint and has difficulty walking. He's been limping. I'll schedule MRI of his right hip joint to rule out internal derangement.  Trochanteric bursitis of right hip - Status post cortisone injection 06/02/2016. The benefit was for short time only.  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 [place] Extrem Up Bilat Comp  . MR HIP RIGHT WO CONTRAST   No orders of the defined types were placed in this encounter.   Face-to-face time spent with patient was 40 minutes. 50% of time was spent in counseling and coordination of care.  Follow-Up Instructions: Return in about 2 months (around 09/19/2016) for Polyarthralgia.   Bo Merino, MD  Note - This record has been created using Editor, commissioning.  Chart creation errors have been sought, but may not always  have been located. Such creation errors do not reflect on  the standard of medical care.

## 2016-07-11 NOTE — Addendum Note (Signed)
Addended by: Star Age on: 07/11/2016 08:07 AM   Modules accepted: Orders

## 2016-07-11 NOTE — Progress Notes (Signed)
Patient referred by [name], seen by me on 06/09/16, diagnostic PSG on 06/19/16, CPAP study on 07/07/16. Please call and inform patient that I have entered an order for treatment with positive airway pressure  (PAP) treatment of obstructive sleep apnea (OSA). He did well during the latest sleep study with CPAP. We will, therefore, arrange for a machine for home use through a DME (durable medical equipment) company of His choice; and I will see the patient back in follow-up in about 10 weeks. Please also explain to the patient that I will be looking out for compliance data, which can be downloaded from the machine (stored on an SD card, that is inserted in the machine) or via remote access through a modem, that is built into the machine. At the time of the followup appointment we will discuss sleep study results and how it is going with PAP treatment at home. Please advise patient to bring His machine at the time of the first FU visit, even though this is cumbersome. Bringing the machine for every visit after that will likely not be needed, but often helps for the first visit to troubleshoot if needed. Please re-enforce the importance of compliance with treatment and the need for [place] to monitor compliance data - often an insurance requirement and actually good feedback for the patient as far as how they are doing.  Also remind patient, that any interim PAP machine or mask issues should be first addressed with the DME company, as they can often help better with technical and mask fit issues. Please ask if patient has a preference regarding DME company.  Please also make sure, the patient has a follow-up appointment with me in about 8-10 weeks from the setup date, thanks.  Once you have spoken to the patient - and faxed/routed report to PCP and referring MD (if other than PCP), you can close this encounter, thanks,   Star Age, MD, PhD [facility] ([place] [facility] [place])

## 2016-07-11 NOTE — Procedures (Signed)
PATIENT'S NAME:  Rodney Walsh, Rodney Walsh DOB:      1950/12/27      MR#:    [id number]     DATE OF RECORDING: 07/07/2016 REFERRING M.D.:  [name] MD Study Performed:   CPAP  Titration HISTORY: 66 year old man with a history of joint pain, degenerative disc disease, diabetes with retinopathy, nocturia, urinary frequency, cataracts, eczema, vitamin D deficiency, ED, prostate cancer, hypertension, hyperlipidemia, and morbid obesity who returns for a full night CPAP titration study. His PSG on 06/19/2016 showed an AH of 16.4/hour, REM AHI of 47/hour, supine AHI of 16.1/hour, and O2 nadir of 72%. Epworth Sleepiness Scale at 7 points. The patient's weight 278 pounds with a height of 64 (inches), resulting in a BMI of 47.4 kg/m2. The patient's neck circumference measured 21 inches.  CURRENT MEDICATIONS: Gabapentin, Glucsoamine-Chondroitin, Lisinopril, Meloxicam, Metformin, Multi-vitamin, oxybutynin and Vitamin D   PROCEDURE:  This is a multichannel digital polysomnogram utilizing the SomnoStar 11.2 system.  Electrodes and sensors were applied and monitored per AASM Specifications.   EEG, EOG, Chin and Limb EMG, were sampled at 200 Hz.  ECG, Snore and Nasal Pressure, Thermal Airflow, Respiratory Effort, CPAP Flow and Pressure, Oximetry was sampled at 50 Hz. Digital video and audio were recorded.      The patient was fitted with a medium Simplus FFM. CPAP was initiated at 5 cmH20 with heated humidity per AASM standards and pressure was advanced to 15 cmH20 because of hypopneas, apneas and desaturations and snore.  At a PAP pressure of 15 cmH20, there was a reduction of the AHI to 2.9/hour, with supine REM sleep achieved, and O2 nadir of 91%.    Lights Out was at 21:51 and Lights On at 04:59. Total recording time (TRT) was 428.5 minutes, with a total sleep time (TST) of 373.5 minutes. The patient's sleep latency was 9 minutes. REM latency was 215 minutes, which is prolonged.  The sleep efficiency was 87.2 %.     SLEEP ARCHITECTURE: WASO (Wake after sleep onset)  was 45.5 minutes with mild to moderate sleep fragmentation noted.  There were 23 minutes in Stage N1, 254 minutes Stage N2, 46.5 minutes Stage N3 and 50 minutes in Stage REM.  The percentage of Stage N1 was 6.2%, Stage N2 was 68.%, which is increased, Stage N3 was 12.4% and Stage R (REM sleep) was 13.4%, which is reduced.   The arousals were noted as: 30 were spontaneous, 6 were associated with PLMs, 22 were associated with respiratory events. Audio and video analysis did not show any abnormal or unusual movements, behaviors, phonations or vocalizations.  The patient took 1 bathroom break. The EKG was in keeping with normal sinus rhythm (NSR).  RESPIRATORY ANALYSIS:  There was a total of 55 respiratory events: 11 obstructive apneas, 0 central apneas and 0 mixed apneas with a total of 11 apneas and an apnea index (AI) of 1.8 /hour. There were 44 hypopneas with a hypopnea index of 7.1/hour. The patient also had 0 respiratory event related arousals (RERAs).      The total APNEA/HYPOPNEA INDEX  (AHI) was 8.8 /hour and the total RESPIRATORY DISTURBANCE INDEX was 8.8 .hour  5 events occurred in REM sleep and 50 events in NREM. The REM AHI was 6 /hour versus a non-REM AHI of 9.3 /hour.  The patient spent 373.5 minutes of total sleep time in the supine position and 0 minutes in non-supine. The supine AHI was 8.9, versus a non-supine AHI of 0.0.  OXYGEN SATURATION & C02:  The baseline 02 saturation was 94%, with the lowest being 86%. Time spent below 89% saturation equaled 3 minutes.  PERIODIC LIMB MOVEMENTS: The patient had a total of 50 Periodic Limb Movements. The Periodic Limb Movement (PLM) index was 8. and the PLM Arousal index was 1. /hour.  Post-study, the patient indicated that sleep was better than usual.   DIAGNOSIS 1. Obstructive Sleep Apnea (OSA)  2. Dysfunctions associated with sleep stages or arousal from sleep     PLANS/RECOMMENDATIONS: 1. This study demonstrates near complete resolution of the patient's obstructive sleep apnea with CPAP therapy. I will, therefore, start the patient on home CPAP treatment at a pressure of 15 cm via medium full face mask with heated humidity. The patient should be reminded to be fully compliant with PAP therapy to improve sleep related symptoms and decrease long term cardiovascular risks. The patient should be reminded, that it may take up to 3 months to get fully used to using PAP with all planned sleep. The earlier full compliance is achieved, the better long term compliance tends to be. Please note that untreated obstructive sleep apnea carries additional perioperative morbidity. Patients with significant obstructive sleep apnea should receive perioperative PAP therapy and the surgeons and particularly the anesthesiologist should be informed of the diagnosis and the severity of the sleep disordered breathing. 2. This study shows sleep fragmentation and abnormal sleep stage percentages; these are nonspecific findings and per se do not signify an intrinsic sleep disorder or a cause for the patient's sleep-related symptoms. Causes include (but are not limited to) the first night effect of the sleep study, circadian rhythm disturbances, medication effect or an underlying mood disorder or medical problem.  3. The patient should be cautioned not to drive, work at heights, or operate dangerous or heavy equipment when tired or sleepy. Review and reiteration of good sleep hygiene measures should be pursued with any patient. 4. The patient will be seen in follow-up by Dr. Rexene Alberts at [facility] for discussion of the test results and further management strategies. The referring provider will be notified of the test results.  I certify that I have reviewed the entire raw data recording prior to the issuance of this report in accordance with the Standards of Accreditation of the American Academy of  Sleep Medicine (AASM)   Star Age, MD, PhD Diplomat, American Board of Psychiatry and Neurology (Neurology and Sleep Medicine)

## 2016-07-12 ENCOUNTER — Encounter: Payer: Self-pay | Admitting: Physician Assistant

## 2016-07-15 ENCOUNTER — Telehealth: Payer: Self-pay

## 2016-07-15 NOTE — Telephone Encounter (Signed)
I lm for patient and then he called back.   I spoke to patient. He is ok with sending orders to [facility]. I will send now. Patient made a f/u appt in June.

## 2016-07-15 NOTE — Telephone Encounter (Signed)
-----   Message from Star Age, MD sent at 07/11/2016  8:07 AM EST ----- Patient referred by [name], seen by me on 06/09/16, diagnostic PSG on 06/19/16, CPAP study on 07/07/16. Please call and inform patient that I have entered an order for treatment with positive airway pressure  (PAP) treatment of obstructive sleep apnea (OSA). He did well during the latest sleep study with CPAP. We will, therefore, arrange for a machine for home use through a DME (durable medical equipment) company of His choice; and I will see the patient back in follow-up in about 10 weeks. Please also explain to the patient that I will be looking out for compliance data, which can be downloaded from the machine (stored on an SD card, that is inserted in the machine) or via remote access through a modem, that is built into the machine. At the time of the followup appointment we will discuss sleep study results and how it is going with PAP treatment at home. Please advise patient to bring His machine at the time of the first FU visit, even though this is cumbersome. Bringing the machine for every visit after that will likely not be needed, but often helps for the first visit to troubleshoot if needed. Please re-enforce the importance of compliance with treatment and the need for [place] to monitor compliance data - often an insurance requirement and actually good feedback for the patient as far as how they are doing.  Also remind patient, that any interim PAP machine or mask issues should be first addressed with the DME company, as they can often help better with technical and mask fit issues. Please ask if patient has a preference regarding DME company.  Please also make sure, the patient has a follow-up appointment with me in about 8-10 weeks from the setup date, thanks.  Once you have spoken to the patient - and faxed/routed report to PCP and referring MD (if other than PCP), you can close this encounter, thanks,   Star Age, MD,  PhD [facility] ([place])

## 2016-07-18 ENCOUNTER — Encounter: Payer: Self-pay | Admitting: Physician Assistant

## 2016-07-20 ENCOUNTER — Encounter: Payer: Self-pay | Admitting: Rheumatology

## 2016-07-20 ENCOUNTER — Ambulatory Visit (INDEPENDENT_AMBULATORY_CARE_PROVIDER_SITE_OTHER): Payer: Medicare Other | Admitting: Rheumatology

## 2016-07-20 ENCOUNTER — Inpatient Hospital Stay (INDEPENDENT_AMBULATORY_CARE_PROVIDER_SITE_OTHER): Payer: Medicare Other

## 2016-07-20 VITALS — BP 138/78 | HR 88 | Resp 18 | Wt 278.0 lb

## 2016-07-20 DIAGNOSIS — M19042 Primary osteoarthritis, left hand: Secondary | ICD-10-CM

## 2016-07-20 DIAGNOSIS — M79641 Pain in right hand: Secondary | ICD-10-CM | POA: Diagnosis not present

## 2016-07-20 DIAGNOSIS — E559 Vitamin D deficiency, unspecified: Secondary | ICD-10-CM | POA: Diagnosis not present

## 2016-07-20 DIAGNOSIS — M1611 Unilateral primary osteoarthritis, right hip: Secondary | ICD-10-CM | POA: Diagnosis not present

## 2016-07-20 DIAGNOSIS — M7061 Trochanteric bursitis, right hip: Secondary | ICD-10-CM

## 2016-07-20 DIAGNOSIS — M79642 Pain in left hand: Secondary | ICD-10-CM

## 2016-07-20 DIAGNOSIS — Z8679 Personal history of other diseases of the circulatory system: Secondary | ICD-10-CM | POA: Diagnosis not present

## 2016-07-20 DIAGNOSIS — M25551 Pain in right hip: Secondary | ICD-10-CM

## 2016-07-20 DIAGNOSIS — M47816 Spondylosis without myelopathy or radiculopathy, lumbar region: Secondary | ICD-10-CM | POA: Diagnosis not present

## 2016-07-20 DIAGNOSIS — M1711 Unilateral primary osteoarthritis, right knee: Secondary | ICD-10-CM | POA: Diagnosis not present

## 2016-07-20 DIAGNOSIS — Z8639 Personal history of other endocrine, nutritional and metabolic disease: Secondary | ICD-10-CM

## 2016-07-20 DIAGNOSIS — M2550 Pain in unspecified joint: Secondary | ICD-10-CM | POA: Diagnosis not present

## 2016-07-20 DIAGNOSIS — Z8669 Personal history of other diseases of the nervous system and sense organs: Secondary | ICD-10-CM | POA: Diagnosis not present

## 2016-07-20 DIAGNOSIS — M25511 Pain in right shoulder: Secondary | ICD-10-CM | POA: Diagnosis not present

## 2016-07-20 DIAGNOSIS — C61 Malignant neoplasm of prostate: Secondary | ICD-10-CM

## 2016-07-20 DIAGNOSIS — M19041 Primary osteoarthritis, right hand: Secondary | ICD-10-CM

## 2016-07-20 DIAGNOSIS — E113213 Type 2 diabetes mellitus with mild nonproliferative diabetic retinopathy with macular edema, bilateral: Secondary | ICD-10-CM

## 2016-07-21 ENCOUNTER — Encounter: Payer: Self-pay | Admitting: Physician Assistant

## 2016-07-21 ENCOUNTER — Other Ambulatory Visit: Payer: Self-pay | Admitting: Physician Assistant

## 2016-07-21 DIAGNOSIS — R351 Nocturia: Secondary | ICD-10-CM

## 2016-07-21 DIAGNOSIS — I10 Essential (primary) hypertension: Secondary | ICD-10-CM

## 2016-07-22 MED ORDER — OXYBUTYNIN CHLORIDE 5 MG PO TABS: 5.0000 mg | ORAL_TABLET | Freq: Four times a day (QID) | ORAL | 3 refills | Status: DC

## 2016-07-23 ENCOUNTER — Encounter: Payer: Self-pay | Admitting: Physician Assistant

## 2016-07-26 ENCOUNTER — Encounter: Payer: Self-pay | Admitting: Physician Assistant

## 2016-07-26 NOTE — Telephone Encounter (Signed)
We can request surgical eval for sleep apnea treatment through ENT.   We can request treatment with a dental appliance, through his or another dentist.   Please let patient know, that CPAP therapy may be his best option for moderate to severe OSA.   I can make a referral to ENT or dentist, whichever he prefers.

## 2016-07-26 NOTE — Telephone Encounter (Signed)
What should I tell this patient?

## 2016-07-26 NOTE — Telephone Encounter (Signed)
Patient called in reference to CPAP machine.  Patient states in order for him to get the CPAP machine he was going to have to pay $80 down and $15 a month and he can not do it.  He is wanting to know if there are any other options he can do.  Please call

## 2016-07-27 ENCOUNTER — Telehealth: Payer: Self-pay | Admitting: Rheumatology

## 2016-07-27 NOTE — Telephone Encounter (Signed)
FYI... And I sent a message to the patient in MyChart giving him the other options again or advised him to contact us back if he changes his mind about CPAP. Or when he meets his deductible.   Rodney Walsh,   So the pt is correct that he will have a copay. I pulled his insurance verification. Here is the breakdown:   Medicare primary; his annual deductible of $183.00 is met and they will cover 80% of the cpap set up & supplies.  BCBS secondary; his annual deductible of $1080.00 isn't met.  Therefore [employer] will not pay the remaining 20% of the amount billed and it will apply towards the deductible.  Once the deductible is met then pt would be covered at 100%.   We explained this to [name] and he said that he couldn't afford this monthly bill. He has cancelled the referral.   Please let me know how we can help. Thanks.   Angie

## 2016-07-27 NOTE — Telephone Encounter (Signed)
Patient calling in ref to the MRI being scheduled in [place] at the [place] [facility] and the mention of an [place] when he was here in the ov.  Please clarify ASAP

## 2016-07-27 NOTE — Telephone Encounter (Signed)
Patient advised he had the ultrasound performed at his appointment on 07/20/16 and that Dr. Estanislado Pandy wants him to have a MRI of his right hip which is scheduled for 07/30/16. Patient verbalized understanding.

## 2016-07-27 NOTE — Telephone Encounter (Signed)
I spoke to patient and gave recommendations below. He says that he does not want any referrals at this time. He states that he spoke with his insurance and they told him to have [name] run the CPAP through his Medicare then send any remaining bill to his secondary insurance. I have sent a note to [name] to see what they say.

## 2016-07-30 ENCOUNTER — Ambulatory Visit (HOSPITAL_BASED_OUTPATIENT_CLINIC_OR_DEPARTMENT_OTHER)
Admission: RE | Admit: 2016-07-30 | Discharge: 2016-07-30 | Disposition: A | Discharge status: Home / Self Care | Payer: Medicare Other | Source: Ambulatory Visit | Attending: Rheumatology | Admitting: Rheumatology

## 2016-07-30 DIAGNOSIS — M25451 Effusion, right hip: Secondary | ICD-10-CM | POA: Insufficient documentation

## 2016-07-30 DIAGNOSIS — M1611 Unilateral primary osteoarthritis, right hip: Secondary | ICD-10-CM | POA: Diagnosis not present

## 2016-07-30 DIAGNOSIS — M12851 Other specific arthropathies, not elsewhere classified, right hip: Secondary | ICD-10-CM | POA: Diagnosis not present

## 2016-07-30 DIAGNOSIS — M25551 Pain in right hip: Secondary | ICD-10-CM

## 2016-08-01 NOTE — Progress Notes (Signed)
His MRI showed small effusion and arthritis in his right hip. He may benefit from cortisone injection which can be arranged through Dr. Romona Curls office. Please call and discussed with patient if he is interested because schedule that appointment.

## 2016-08-02 ENCOUNTER — Telehealth: Payer: Self-pay | Admitting: *Deleted

## 2016-08-02 DIAGNOSIS — M25451 Effusion, right hip: Secondary | ICD-10-CM

## 2016-08-02 NOTE — Telephone Encounter (Signed)
-----   Message from Bo Merino, MD sent at 08/01/2016  1:47 PM EDT ----- His MRI showed small effusion and arthritis in his right hip. He may benefit from cortisone injection which can be arranged through Dr. Romona Curls office. Please call and discussed with patient if he is interested because schedule that appointment.

## 2016-08-03 NOTE — Telephone Encounter (Signed)
I received another email from [place]:  Shelby Dubin, RN; Leeroy Bock        Harless Nakayama,  Pt called to review his copay again. He still declined due to feeling like he should be covered at 100%.   Angie

## 2016-08-11 ENCOUNTER — Other Ambulatory Visit: Payer: Self-pay | Admitting: Urgent Care

## 2016-08-12 NOTE — Telephone Encounter (Signed)
07/01/16 last refill

## 2016-08-16 ENCOUNTER — Ambulatory Visit (INDEPENDENT_AMBULATORY_CARE_PROVIDER_SITE_OTHER): Payer: Medicare Other | Admitting: Physician Assistant

## 2016-08-16 ENCOUNTER — Encounter: Payer: Self-pay | Admitting: Physician Assistant

## 2016-08-16 VITALS — BP 136/82 | HR 89 | Temp 97.9°F | Resp 18 | Ht 64.0 in | Wt 284.0 lb

## 2016-08-16 DIAGNOSIS — E113213 Type 2 diabetes mellitus with mild nonproliferative diabetic retinopathy with macular edema, bilateral: Secondary | ICD-10-CM | POA: Diagnosis not present

## 2016-08-16 DIAGNOSIS — Z23 Encounter for immunization: Secondary | ICD-10-CM

## 2016-08-16 DIAGNOSIS — E13311 Other specified diabetes mellitus with unspecified diabetic retinopathy with macular edema: Secondary | ICD-10-CM

## 2016-08-16 DIAGNOSIS — E785 Hyperlipidemia, unspecified: Secondary | ICD-10-CM

## 2016-08-16 DIAGNOSIS — R351 Nocturia: Secondary | ICD-10-CM | POA: Diagnosis not present

## 2016-08-16 DIAGNOSIS — E11319 Type 2 diabetes mellitus with unspecified diabetic retinopathy without macular edema: Secondary | ICD-10-CM | POA: Diagnosis not present

## 2016-08-16 DIAGNOSIS — M1611 Unilateral primary osteoarthritis, right hip: Secondary | ICD-10-CM | POA: Diagnosis not present

## 2016-08-16 DIAGNOSIS — I10 Essential (primary) hypertension: Secondary | ICD-10-CM

## 2016-08-16 MED ORDER — MELOXICAM 15 MG PO TABS: 15.0000 mg | ORAL_TABLET | Freq: Every day | ORAL | 0 refills | Status: DC

## 2016-08-16 MED ORDER — OXYBUTYNIN CHLORIDE 5 MG PO TABS: 5.0000 mg | ORAL_TABLET | Freq: Four times a day (QID) | ORAL | 3 refills | Status: DC

## 2016-08-16 NOTE — Patient Instructions (Signed)
     IF you received an x-ray today, you will receive an invoice from [facility]. Please contact  Radiology at [phone] with questions or concerns regarding your invoice.   IF you received labwork today, you will receive an invoice from [facility]. Please contact [facility] at [phone] with questions or concerns regarding your invoice.   Our billing staff will not be able to assist you with questions regarding bills from these companies.  You will be contacted with the lab results as soon as they are available. The fastest way to get your results is to activate your My Chart account. Instructions are located on the last page of this paperwork. If you have not heard from us regarding the results in 2 weeks, please contact this office.     

## 2016-08-16 NOTE — Progress Notes (Signed)
Name: Rodney Walsh  DOB: 31-Jul-1950  Age: 66 y.o. Sex: male  CC:  Chief Complaint  Patient presents with  . Follow-up    re-evaluation and fasting labs.   . Medication Refill    Meloxicam     PCP: [name], PA-C  HPI:  Rodney Walsh presents today for medication refills, fasting labs and re-evaluation of his chronic problems. He is accompanied by his wife, Rodney Walsh.  Their 38 year old son died 08/14/2016, of a drug overdose in their home.    Home BP monitoring reveals pressures 130's/70-80's. Home glucose 120's-140's. Intermittently works on [employer], but continues to consume sweets and drink sweet tea. He does not exercise, due to his orthopedic problems, specifically the RIGHT hip. He is to see orthopedics again tomorrow for a joint injection, and asks for a refill of meloxicam today.  ROS:  Constitutional: Negative for activity change, appetite change, fatigue and unexpected weight change.  HENT: Negative for congestion, dental problem, ear pain, hearing loss, mouth sores, postnasal drip, rhinorrhea, sneezing, sore throat, tinnitus and trouble swallowing.   Eyes: Negative for photophobia, pain, redness and visual disturbance.  Respiratory: Negative for cough, chest tightness and shortness of breath.   Cardiovascular: Negative for chest pain, palpitations and leg swelling.  Gastrointestinal: Negative for abdominal pain, blood in stool, constipation, diarrhea, nausea and vomiting.  Genitourinary: Negative for dysuria, frequency, hematuria and urgency.  Musculoskeletal: Negative for other arthralgias/myalgias and neck stiffness.  Skin: Negative for rash.  Neurological: Negative for dizziness, speech difficulty, weakness, light-headedness, numbness and headaches.  Hematological: Negative for adenopathy.  Psychiatric/Behavioral: Negative for confusion and sleep disturbance. The patient is not nervous/anxious.    PMH:  Patient Active Problem List   Diagnosis Date Noted  .  Primary osteoarthritis of right hip 06/02/2016  . Primary osteoarthritis of right knee 06/02/2016  . Urinary frequency 03/15/2016  . Pain in joint, shoulder region 03/15/2016  . Degenerative disc disease, lumbar 10/13/2015  . 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 Diabetic retinopathy ([place]) 10/04/2015  . Nocturia 06/10/2014  . Cataract 09/25/2012  . OSA (obstructive sleep apnea) 08/10/2012  . Eczema 08/10/2012  . Vitamin D deficiency 08/10/2012  . Erectile dysfunction 08/10/2012  . Personal history of adenomatous colonic polyps 04/08/2012  . Prostate cancer ([place]) 01/04/2012  . Diabetes type 2, controlled ([place]) 10/25/2011  . HTN (hypertension) 10/25/2011  . Hyperlipidemia LDL goal <70 10/25/2011  . Morbid obesity ([place]) 10/25/2011    Allergies: No Known Allergies  Medications:  Current Outpatient Prescriptions on File Prior to Visit  Medication Sig Dispense Refill  . aspirin 81 MG tablet Take 81 mg by mouth daily.    . Blood Glucose Monitoring Suppl (ONE TOUCH ULTRA SYSTEM KIT) w/Device KIT Use to test blood sugar daily. 1 each 0  . gabapentin (NEURONTIN) 600 MG tablet Take 300 mg by mouth 4 (four) times daily.    Marland Kitchen glucosamine-chondroitin 500-400 MG tablet Take 1 tablet by mouth 3 (three) times daily.    Marland Kitchen lisinopril (PRINIVIL,ZESTRIL) 20 MG tablet TAKE ONE TABLET BY MOUTH DAILY. 90 tablet 1  . meloxicam (MOBIC) 15 MG tablet TAKE ONE TABLET BY MOUTH ONCE DAILY 30 tablet 0  . metFORMIN (GLUCOPHAGE) 1000 MG tablet TAKE ONE TABLET BY MOUTH TWICE DAILY WITH  A  MEAL. 180 tablet 1  . Multiple Vitamin (MULTIVITAMIN) tablet Take 1 tablet by mouth daily.    Marland Kitchen oxybutynin (DITROPAN) 5 MG tablet Take 1 tablet (5 mg total) by  mouth 4 (four) times daily. 360 tablet 3  . Vitamin D, Ergocalciferol, (DRISDOL) 50000 units CAPS capsule TAKE ONE CAPSULE BY MOUTH (50,000 UNITS TOTAL) ONCE A WEEK 12 capsule 0  . clobetasol ointment (TEMOVATE) 6.96 % Apply 1 application topically 2  (two) times daily. To the lesion on the LEG. (Patient not taking: Reported on 08/16/2016) 60 g 0  . colchicine 0.6 MG tablet Take 1 tablet (0.6 mg total) by mouth 2 (two) times daily. (Patient not taking: Reported on 07/20/2016) 6 tablet 0  . [DISCONTINUED] oxybutynin (DITROPAN) 5 MG tablet Take 1 tablet (5 mg total) by mouth 2 (two) times daily. 60 tablet 5   No current facility-administered medications on file prior to visit.     PE: Blood pressure 136/82, pulse 89, temperature 97.9 F (36.6 C), temperature source Oral, resp. rate 18, height _0  (1.626 m), weight 284 lb (128.8 kg), SpO2 94 %. Body mass index is 48.75 kg/m. Well-developed, well nourished BM who is awake, alert and oriented, in NAD. HEENT: [place]/AT   Neck: supple, non-tender, no lymphadenopathy, thyromegaly. Heart: RRR, no murmur Lungs: normal effort, CTA Extremities: no cyanosis, clubbing or edema. Skin: warm and dry without rash. Psychologic: good mood and appropriate affect, normal speech and behavior.   A&P:  Problem List Items Addressed This Visit    Diabetes type 2, controlled ([place]) (Chronic)    Await lab results. Adjust regimen as indicated. Encouraged efforts for healthier eating, and reviewed risks associated with uncontrolled DM.      Relevant Orders   Comprehensive metabolic panel (Completed)   Hemoglobin A1c (Completed)   HTN (hypertension) - Primary (Chronic)    Controlled. Continue current treatment.      Relevant Orders   CBC with Differential/Platelet (Completed)   Hyperlipidemia LDL goal <70 (Chronic)    Await lab results. Previously on statin, which he stopped a year or so ago on the advice of his pastor.      Relevant Orders   Lipid panel (Completed)   Morbid obesity ([place]) (Chronic)    Again reviewed the risks associated with his unhealthy eating habits, especially as he isn't able to get much exercise due to OA.      Nocturia (Chronic)    Long-standing, and doesn't resolve with control  of glucose. Has seen urology. Continue oxybutynin.      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      Diabetic retinopathy ([place])    Continue to work on control of glucose.      Primary osteoarthritis of right hip   Relevant Medications   meloxicam (MOBIC) 15 MG tablet    Other Visit Diagnoses    Need for prophylactic vaccination against Streptococcus pneumoniae (pneumococcus)       Relevant Orders   Pneumococcal polysaccharide vaccine 23-valent greater than or equal to 2yo subcutaneous/IM (Completed)       Return in about 3 months (around 11/15/2016) for re-evaluation of diabetes.   Fara Chute, PA-C [facility]

## 2016-08-16 NOTE — Progress Notes (Signed)
THIS NOTE IS USED FOR EDUCATIONAL PURPOSES ONLY!!!   Name: Rodney Walsh  DOB: [birth year]  Age: 66 y.o. Sex: male  CC:  Chief Complaint  Patient presents with  . Follow-up    re-evaluation and fasting labs.   . Medication Refill    Meloxicam     PCP: [name], PA-C  HPI: Patient reports today for f/u and refill of his meloxicam.   Patient reports he takes his BP at home. Usually it runs 130s over 70-80s. Denies HA or vision changes.   Sugars at home: run 120s-140s. Denies numbness/tingling in toes/feet.   Diet: "Sometimes" reports a lot of vegetables, sweet teas. A lot of desserts.  Exercise: Reports he cannot do it cause of his bad hip.   Patient reports he is continuing to have pain at his right hip. Followed by orthopedics. He is getting a joint injection tomorrow for his hip. He is requesting a refill of his Mobic today for his right hip.   Patient is also requesting his oxybutynin refilled.   Due for second dose of PPSV-23 is due. Willing to get today.    ROS:  Constitutional: Negative for activity change, appetite change, fatigue and unexpected weight change.  HENT: Negative for congestion, dental problem, ear pain, hearing loss, mouth sores, postnasal drip, rhinorrhea, sneezing, sore throat, tinnitus and trouble swallowing.   Eyes: Negative for photophobia, pain, redness and visual disturbance.  Respiratory: Negative for cough, chest tightness and shortness of breath.   Cardiovascular: Negative for chest pain, palpitations and leg swelling.  Gastrointestinal: Negative for abdominal pain, blood in stool, constipation, diarrhea, nausea and vomiting.  Genitourinary: Negative for dysuria, frequency, hematuria and urgency.  Musculoskeletal: Negative for arthralgias, gait problem, myalgias and neck stiffness.  Skin: Negative for rash.  Neurological: Negative for dizziness, speech difficulty, weakness, light-headedness, numbness and headaches.  Hematological: Negative  for adenopathy.  Psychiatric/Behavioral: Negative for confusion and sleep disturbance. The patient is not nervous/anxious.    PMH:  Patient Active Problem List   Diagnosis Date Noted  . Primary osteoarthritis of right hip 06/02/2016  . Primary osteoarthritis of right knee 06/02/2016  . Urinary frequency 03/15/2016  . Pain in joint, shoulder region 03/15/2016  . Degenerative disc disease, lumbar 10/13/2015  . 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 Diabetic retinopathy ([place]) 10/04/2015  . Nocturia 06/10/2014  . Cataract 09/25/2012  . OSA (obstructive sleep apnea) 08/10/2012  . Eczema 08/10/2012  . Vitamin D deficiency 08/10/2012  . Erectile dysfunction 08/10/2012  . Personal history of adenomatous colonic polyps 04/08/2012  . Prostate cancer ([place]) 01/04/2012  . Diabetes type 2, controlled ([place]) 10/25/2011  . HTN (hypertension) 10/25/2011  . Hyperlipidemia LDL goal <70 10/25/2011  . Morbid obesity ([place]) 10/25/2011    Allergies: No Known Allergies  Medications:  Current Outpatient Prescriptions on File Prior to Visit  Medication Sig Dispense Refill  . aspirin 81 MG tablet Take 81 mg by mouth daily.    . Blood Glucose Monitoring Suppl (ONE TOUCH ULTRA SYSTEM KIT) w/Device KIT Use to test blood sugar daily. 1 each 0  . gabapentin (NEURONTIN) 600 MG tablet Take 300 mg by mouth 4 (four) times daily.    Marland Kitchen glucosamine-chondroitin 500-400 MG tablet Take 1 tablet by mouth 3 (three) times daily.    Marland Kitchen lisinopril (PRINIVIL,ZESTRIL) 20 MG tablet TAKE ONE TABLET BY MOUTH DAILY. 90 tablet 1  . meloxicam (MOBIC) 15 MG tablet TAKE ONE TABLET BY MOUTH ONCE DAILY 30 tablet 0  .  metFORMIN (GLUCOPHAGE) 1000 MG tablet TAKE ONE TABLET BY MOUTH TWICE DAILY WITH  A  MEAL. 180 tablet 1  . Multiple Vitamin (MULTIVITAMIN) tablet Take 1 tablet by mouth daily.    Marland Kitchen oxybutynin (DITROPAN) 5 MG tablet Take 1 tablet (5 mg total) by mouth 4 (four) times daily. 360 tablet 3  . Vitamin D,  Ergocalciferol, (DRISDOL) 50000 units CAPS capsule TAKE ONE CAPSULE BY MOUTH (50,000 UNITS TOTAL) ONCE A WEEK 12 capsule 0  . clobetasol ointment (TEMOVATE) 5.37 % Apply 1 application topically 2 (two) times daily. To the lesion on the LEG. (Patient not taking: Reported on 08/16/2016) 60 g 0  . colchicine 0.6 MG tablet Take 1 tablet (0.6 mg total) by mouth 2 (two) times daily. (Patient not taking: Reported on 07/20/2016) 6 tablet 0  . [DISCONTINUED] oxybutynin (DITROPAN) 5 MG tablet Take 1 tablet (5 mg total) by mouth 2 (two) times daily. 60 tablet 5   No current facility-administered medications on file prior to visit.     PE:  GS: WDWN male sitting on exam table in NAD.  Vitals:  Vitals:   08/16/16 0932 08/16/16 1003  BP: (!) 146/80 136/82  Pulse: 89   Resp: 18   Temp: 97.9 F (36.6 C)    HEENT: Normocephalic, atruamatic. PEARRL. No cervical lymphadenopathy. No thyroid nodules, normal size, and equal bilaterally.  Cardiovascular: RRR. No S3 or S4. No murmurs, rubs, or gallops. Pulses 2+ and equal bilateral in the upper and lower extremities. No pitting edema. No varicosities, clubbing, or cyanosis.  Pulm: CTA bilaterally. No expiratory muscle use while breathing.  GI: +BS. NTND. No rigidity or guarding. No rebound tenderness.  Neuro: CN 2-12 grossly intact.  Psych: A&O x 4. Mood and affect appropriate for situation.  Skin: Warm and dry. No rashes or excoriations on exposed skin.   A&P:  1. Essential hypertension - controlled Plan: CBC with Differential/Platelet  2. Controlled type 2 diabetes mellitus with retinopathy of both eyes, without long-term current use of insulin, macular edema presence unspecified, unspecified retinopathy severity ([place]) - controlled  Plan: Comprehensive metabolic panel, Hemoglobin A1c  3. 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 Both eyes affected by mild nonproliferative diabetic retinopathy with macular edema, associated with type 2 diabetes  mellitus ([place])  5. Hyperlipidemia LDL goal <70 - Controlled. Repeat labs today.  Plan: Lipid panel  6. Morbid obesity ([place]) -  7. Need for prophylactic vaccination against Streptococcus pneumoniae (pneumococcus) - Plan: Pneumococcal polysaccharide vaccine 23-valent greater than or equal to 2yo subcutaneous/IM  8. Primary osteoarthritis of right hip - seen by orthopedics. Getting joint injection tomorrow.  Plan:  -Pharm: Mobic 15g refill given today.        Respectfully,  Delilah Shan, PA-S2

## 2016-08-17 ENCOUNTER — Ambulatory Visit (INDEPENDENT_AMBULATORY_CARE_PROVIDER_SITE_OTHER): Payer: Medicare Other | Admitting: Physical Medicine and Rehabilitation

## 2016-08-17 ENCOUNTER — Ambulatory Visit (INDEPENDENT_AMBULATORY_CARE_PROVIDER_SITE_OTHER): Payer: Medicare Other

## 2016-08-17 ENCOUNTER — Encounter (INDEPENDENT_AMBULATORY_CARE_PROVIDER_SITE_OTHER): Payer: Self-pay | Admitting: Physical Medicine and Rehabilitation

## 2016-08-17 VITALS — BP 151/87 | HR 70

## 2016-08-17 DIAGNOSIS — M25551 Pain in right hip: Secondary | ICD-10-CM

## 2016-08-17 LAB — CBC WITH DIFFERENTIAL/PLATELET
BASOS ABS: 0 10*3/uL (ref 0.0–0.2)
Basos: 1 %
EOS (ABSOLUTE): 0.1 10*3/uL (ref 0.0–0.4)
EOS: 2 %
HEMATOCRIT: 39.9 % (ref 37.5–51.0)
HEMOGLOBIN: 12.9 g/dL — AB (ref 13.0–17.7)
Immature Grans (Abs): 0 10*3/uL (ref 0.0–0.1)
Immature Granulocytes: 0 %
LYMPHS ABS: 1.2 10*3/uL (ref 0.7–3.1)
Lymphs: 27 %
MCH: 29.6 pg (ref 26.6–33.0)
MCHC: 32.3 g/dL (ref 31.5–35.7)
MCV: 92 fL (ref 79–97)
MONOCYTES: 11 %
Monocytes Absolute: 0.5 10*3/uL (ref 0.1–0.9)
NEUTROS ABS: 2.6 10*3/uL (ref 1.4–7.0)
Neutrophils: 59 %
Platelets: 249 10*3/uL (ref 150–379)
RBC: 4.36 x10E6/uL (ref 4.14–5.80)
RDW: 13.5 % (ref 12.3–15.4)
WBC: 4.3 10*3/uL (ref 3.4–10.8)

## 2016-08-17 LAB — COMPREHENSIVE METABOLIC PANEL WITH GFR
ALT: 18 IU/L (ref 0–44)
AST: 18 IU/L (ref 0–40)
Albumin/Globulin Ratio: 1.5 (ref 1.2–2.2)
Albumin: 4.2 g/dL (ref 3.6–4.8)
Alkaline Phosphatase: 77 IU/L (ref 39–117)
BUN/Creatinine Ratio: 13 (ref 10–24)
BUN: 13 mg/dL (ref 8–27)
Bilirubin Total: 0.2 mg/dL (ref 0.0–1.2)
CO2: 24 mmol/L (ref 18–29)
Calcium: 9.4 mg/dL (ref 8.6–10.2)
Chloride: 100 mmol/L (ref 96–106)
Creatinine, Ser: 1.04 mg/dL (ref 0.76–1.27)
GFR calc Af Amer: 86 mL/min/1.73
GFR calc non Af Amer: 74 mL/min/1.73
Globulin, Total: 2.8 g/dL (ref 1.5–4.5)
Glucose: 138 mg/dL — ABNORMAL HIGH (ref 65–99)
Potassium: 5.3 mmol/L — ABNORMAL HIGH (ref 3.5–5.2)
Sodium: 139 mmol/L (ref 134–144)
Total Protein: 7 g/dL (ref 6.0–8.5)

## 2016-08-17 LAB — LIPID PANEL
CHOL/HDL RATIO: 4.2 ratio (ref 0.0–5.0)
Cholesterol, Total: 166 mg/dL (ref 100–199)
HDL: 40 mg/dL (ref >39)
LDL CALC: 95 mg/dL (ref 0–99)
Triglycerides: 155 mg/dL — ABNORMAL HIGH (ref 0–149)
VLDL Cholesterol Cal: 31 mg/dL (ref 5–40)

## 2016-08-17 LAB — HEMOGLOBIN A1C
Est. average glucose Bld gHb Est-mCnc: 148 mg/dL
Hgb A1c MFr Bld: 6.8 % — ABNORMAL HIGH (ref 4.8–5.6)

## 2016-08-17 NOTE — Patient Instructions (Signed)

## 2016-08-17 NOTE — Progress Notes (Signed)
Rodney Walsh - 66 y.o. male MRN [id number]  Date of birth: [birth year]  Office Visit Note: Visit Date: 08/17/2016 PCP: [name], PA-C Referred by: [name], PA-C  Subjective: Chief Complaint  Patient presents with  . Right Hip - Pain   HPI: Rodney Walsh , "Rodney Walsh" is a 66 y.o. gentleman with history of polyarthralgia and positive rheumatoid factor. He is followed by Dr. Patrecia Pour who completed trochanteric injection with mild relief but it was short-term. She ultimately obtained an MRI of his hip which is reviewed below. He reports right hip and groin pain for around 8 months. Constant pain with movement. No pain with sitting. Pain sometimes radiates down to knee. He denies any radicular-type complaints or paresthesias. He is also followed by Daphane Shepherd, PA-C who acts as his primary care physician. They recently started him on Mobic 15 mg.    ROS Otherwise per HPI.  Assessment & Plan: Visit Diagnoses:  1. Pain in right hip     Plan: Findings:  Right hip anesthetic arthrogram. Patient had good relief during the anesthetic phase of the injection.    Meds & Orders: No orders of the defined types were placed in this encounter.   Orders Placed This Encounter  Procedures  . Large Joint Injection/Arthrocentesis  . XR C-ARM NO REPORT    Follow-up: Return for Dr. Estanislado Pandy.   Procedures: Hip anesthetic arthrogram Date/Time: 08/17/2016 2:07 PM Performed by: Magnus Sinning Authorized by: Magnus Sinning   Consent Given by:  Patient Site marked: the procedure site was marked   Timeout: prior to procedure the correct patient, procedure, and site was verified   Indications:  Pain and diagnostic evaluation Location:  Hip Site:  R hip joint Prep: patient was prepped and draped in usual sterile fashion   Needle Size:  22 G Approach:  Anterior Ultrasound Guidance: No   Fluoroscopic Guidance: No   Arthrogram: Yes   Medications:  3 mL bupivacaine 0.5 %; 80 mg  triamcinolone acetonide 40 MG/ML Aspiration Attempted: Yes   Patient tolerance:  Patient tolerated the procedure well with no immediate complications  Arthrogram demonstrated excellent flow of contrast throughout the joint surface without extravasation or obvious defect.  The patient had relief of symptoms during the anesthetic phase of the injection.      No notes on file   Clinical History: Right hip MRI  07/30/2016 IMPRESSION: 1. Severe asymmetric right hip arthropathy with small effusion. This pattern may reflect osteoarthritis, although the prominent subchondral cyst formation can be seen with CPPD arthropathy. 2. The left hip demonstrates no significant arthropathy. Mild sacroiliac degenerative changes bilaterally. 3. No acute osseous findings or evidence of avascular necrosis.  He reports that he has quit smoking. His smoking use included Cigarettes. He has a 45.00 pack-year smoking history. He has never used smokeless tobacco.   Recent Labs  10/13/15 1041 02/09/16 1108 06/02/16 0906 08/16/16 1156  HGBA1C 7.3* 6.3*  --  6.8*  LABURIC  --   --  7.8  --     Objective:  VS:  HT:    WT:   BMI:     BP:(!) 151/87  HR:70bpm  TEMP: ( )  RESP:  Physical Exam  Musculoskeletal:  Patient ambulates without aid he has concordant significant pain with right hip internal rotation. He has no left hip pain with rotation. He has good distal strength.    Ortho Exam Imaging: No results found.  Past Medical/Family/Surgical/Social History: Medications & Allergies reviewed per EMR Patient  Active Problem List   Diagnosis Date Noted  . Primary osteoarthritis of right hip 06/02/2016  . Primary osteoarthritis of right knee 06/02/2016  . Urinary frequency 03/15/2016  . Pain in joint, shoulder region 03/15/2016  . Degenerative disc disease, lumbar 10/13/2015  . 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 Diabetic retinopathy ([place]) 10/04/2015  . Nocturia 06/10/2014  .  Cataract 09/25/2012  . OSA (obstructive sleep apnea) 08/10/2012  . Eczema 08/10/2012  . Vitamin D deficiency 08/10/2012  . Erectile dysfunction 08/10/2012  . Personal history of adenomatous colonic polyps 04/08/2012  . Prostate cancer ([place]) 01/04/2012  . Diabetes type 2, controlled ([place]) 10/25/2011  . HTN (hypertension) 10/25/2011  . Hyperlipidemia LDL goal <70 10/25/2011  . Morbid obesity ([place]) 10/25/2011   Past Medical History:  Diagnosis Date  . Arthritis    left shoulder and left knee  . Diabetes mellitus without complication ([place])   . Hyperlipidemia   . Hypertension   . Nephrolithiasis   . Prostate cancer [facility])    Family History  Problem Relation Age of Onset  . Cancer Mother     ovarian  . Alzheimer's disease Father   . Hypertension Sister   . Diabetes Brother   . Renal Disease Brother   . Heart disease Brother   . Diabetes Daughter   . Seizures Daughter   . Cancer Sister     ?uterine  . Hypertension Sister   . Diabetes Sister   . Hyperlipidemia Sister   . Hypertension Sister   . Alcohol abuse Son   . Drug abuse Son     heroin, prescription opiates  . Colon cancer Neg Hx    Past Surgical History:  Procedure Laterality Date  . BACK SURGERY    . COLONOSCOPY N/A 07/17/2012   Procedure: COLONOSCOPY;  Surgeon: Gatha Mayer, MD;  Location: [facility] ENDOSCOPY;  Service: Endoscopy;  Laterality: N/A;  . PROSTATE SURGERY     with seed placement   Social History   Occupational History  . [employer]    Retired    Social History Main Topics  . Smoking status: Former Smoker    Packs/day: 1.00    Years: 45.00    Types: Cigarettes  . Smokeless tobacco: Never Used     Comment: Quit in 2008  . Alcohol use No  . Drug use: No  . Sexual activity: Yes    Partners: Female

## 2016-08-19 MED ORDER — TRIAMCINOLONE ACETONIDE 40 MG/ML IJ SUSP
80.0000 mg | INTRAMUSCULAR | Status: AC | PRN
  Administered 2016-08-17: 80 mg via INTRA_ARTICULAR

## 2016-08-19 MED ORDER — BUPIVACAINE HCL 0.5 % IJ SOLN
3.0000 mL | INTRAMUSCULAR | Status: AC | PRN
  Administered 2016-08-17: 3 mL via INTRA_ARTICULAR

## 2016-08-30 ENCOUNTER — Telehealth: Payer: Self-pay

## 2016-08-30 NOTE — Telephone Encounter (Signed)
Noted, will shred this fax from [place].

## 2016-08-30 NOTE — Telephone Encounter (Signed)
Pt called back said he has does not have CPAP yet, he could not afford to pay $80 down to [facility]. He said in the meantime he rec'd a call from [facility] that they could supply him with all CPAP supplies and [facility] would pay for it. He did tell them he does not have CPAP yet. He is asking not to send any information back to [facility]

## 2016-08-30 NOTE — Telephone Encounter (Signed)
Received a fax from [facility] in [place], [place] asking for an order for pt's cpap supplies. We sent the order to [facility] for cpap. I just want to verify with pt that he is using the [facility] before we send this order back.  I called pt, no answer, left a message asking him to call me back.

## 2016-09-01 NOTE — Assessment & Plan Note (Signed)
Await lab results. Previously on statin, which he stopped a year or so ago on the advice of his pastor.

## 2016-09-01 NOTE — Assessment & Plan Note (Signed)
Controlled. Continue current treatment. 

## 2016-09-01 NOTE — Assessment & Plan Note (Signed)
Continue to work on control of glucose.

## 2016-09-01 NOTE — Assessment & Plan Note (Signed)
Await lab results. Adjust regimen as indicated. Encouraged efforts for healthier eating, and reviewed risks associated with uncontrolled DM.

## 2016-09-01 NOTE — Assessment & Plan Note (Signed)
Again reviewed the risks associated with his unhealthy eating habits, especially as he isn't able to get much exercise due to OA.

## 2016-09-01 NOTE — Assessment & Plan Note (Signed)
Long-standing, and doesn't resolve with control of glucose. Has seen urology. Continue oxybutynin.

## 2016-09-17 ENCOUNTER — Other Ambulatory Visit: Payer: Self-pay | Admitting: Physician Assistant

## 2016-09-17 DIAGNOSIS — E559 Vitamin D deficiency, unspecified: Secondary | ICD-10-CM

## 2016-09-21 ENCOUNTER — Ambulatory Visit: Payer: Medicare Other | Admitting: Rheumatology

## 2016-09-21 DIAGNOSIS — M19042 Primary osteoarthritis, left hand: Secondary | ICD-10-CM

## 2016-09-21 DIAGNOSIS — R768 Other specified abnormal immunological findings in serum: Secondary | ICD-10-CM | POA: Insufficient documentation

## 2016-09-21 DIAGNOSIS — M19041 Primary osteoarthritis, right hand: Secondary | ICD-10-CM | POA: Insufficient documentation

## 2016-09-21 DIAGNOSIS — M7061 Trochanteric bursitis, right hip: Secondary | ICD-10-CM | POA: Insufficient documentation

## 2016-09-21 DIAGNOSIS — M2550 Pain in unspecified joint: Secondary | ICD-10-CM | POA: Insufficient documentation

## 2016-09-21 NOTE — Progress Notes (Signed)
Office Visit Note  Patient: Rodney Walsh             Date of Birth: [birth year]           MRN: [id number]             PCP: [name], PA-C Referring: [name], PA-C Visit Date: 09/27/2016 Occupation: @GUAROCC @    Subjective:  Left shoulder pain.   History of Present Illness: Rodney Walsh is a 66 y.o. male with history of osteoarthritis and trochanteric bursitis. He states she's been having a lot of pain and discomfort in his left shoulder. He continues to have some lower back discomfort. He does have some knee joint osteoarthritis and hands which is tolerable. His right trochanteric area is doing better after the cortisone injection.  Activities of Daily Living:  Patient reports morning stiffness for 1 hour.   Patient Reports nocturnal pain. Left shoulder Difficulty dressing/grooming: Reports Difficulty climbing stairs: Reports balance Difficulty getting out of chair: Reports Difficulty using hands for taps, buttons, cutlery, and/or writing: Denies   Review of Systems  Constitutional: Negative for fatigue, night sweats and weakness ( ).  HENT: Positive for mouth dryness. Negative for mouth sores and nose dryness.   Eyes: Negative for redness and dryness.  Respiratory: Negative for shortness of breath and difficulty breathing.   Cardiovascular: Positive for hypertension. Negative for chest pain, palpitations, irregular heartbeat and swelling in legs/feet.  Gastrointestinal: Negative for constipation and diarrhea.  Endocrine: Negative for increased urination.  Genitourinary: Positive for urgency.  Musculoskeletal: Positive for arthralgias and joint pain. Negative for joint swelling, myalgias, muscle weakness, morning stiffness, muscle tenderness and myalgias.  Skin: Negative for color change, rash, hair loss, nodules/bumps, skin tightness, ulcers and sensitivity to sunlight.  Allergic/Immunologic: Negative for susceptible to infections.  Neurological: Negative  for dizziness, fainting, memory loss and night sweats.  Hematological: Negative for swollen glands.  Psychiatric/Behavioral: Positive for sleep disturbance. Negative for depressed mood. The patient is not nervous/anxious.     PMFS History:  Patient Active Problem List   Diagnosis Date Noted  . Polyarthralgia 09/21/2016  . Trochanteric bursitis of right hip 09/21/2016  . Primary osteoarthritis of both hands 09/21/2016  . Rheumatoid factor positive 09/21/2016  . Primary osteoarthritis of right hip 06/02/2016  . Primary osteoarthritis of right knee 06/02/2016  . Urinary frequency 03/15/2016  . Pain in joint, shoulder region 03/15/2016  . Degenerative disc disease, lumbar 10/13/2015  . 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 Diabetic retinopathy ([place]) 10/04/2015  . Nocturia 06/10/2014  . Cataract 09/25/2012  . OSA (obstructive sleep apnea) 08/10/2012  . Eczema 08/10/2012  . Vitamin D deficiency 08/10/2012  . Erectile dysfunction 08/10/2012  . Personal history of adenomatous colonic polyps 04/08/2012  . Prostate cancer ([place]) 01/04/2012  . Diabetes type 2, controlled ([place]) 10/25/2011  . HTN (hypertension) 10/25/2011  . Hyperlipidemia LDL goal <70 10/25/2011  . Morbid obesity ([place]) 10/25/2011    Past Medical History:  Diagnosis Date  . Arthritis    left shoulder and left knee  . Diabetes mellitus without complication ([place])   . Hyperlipidemia   . Hypertension   . Nephrolithiasis   . Prostate cancer [facility])     Family History  Problem Relation Age of Onset  . Cancer Mother        ovarian  . Alzheimer's disease Father   . Hypertension Sister   . Diabetes Brother   . Renal Disease Brother   .  Heart disease Brother   . Diabetes Daughter   . Seizures Daughter   . Cancer Sister        ?uterine  . Hypertension Sister   . Diabetes Sister   . Hyperlipidemia Sister   . Hypertension Sister   . Alcohol abuse Son   . Drug abuse Son        heroin, prescription  opiates  . Colon cancer Neg Hx    Past Surgical History:  Procedure Laterality Date  . BACK SURGERY    . COLONOSCOPY N/A 07/17/2012   Procedure: COLONOSCOPY;  Surgeon: Gatha Mayer, MD;  Location: [facility] ENDOSCOPY;  Service: Endoscopy;  Laterality: N/A;  . PROSTATE SURGERY     with seed placement   Social History   Social History Narrative   Lives with his wife and their youngest son Paramedic. Has four sons and one daughter. Has 9 grandchildren.      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    Drinks 2 cups of coffee a day      Objective: Vital Signs: BP 132/67 (BP Location: Left Arm, Patient Position: Sitting, Cuff Size: Large)   Pulse 73   Resp 14   Ht 5\' 4"  (1.626 m)   Wt 278 lb (126.1 kg)   BMI 47.72 kg/m    Physical Exam  Constitutional: He is oriented to person, place, and time. He appears well-developed and well-nourished.  HENT:  Head: Normocephalic and atraumatic.  Eyes: Conjunctivae and EOM are normal. Pupils are equal, round, and reactive to light.  Neck: Normal range of motion. Neck supple.  Cardiovascular: Normal rate, regular rhythm and normal heart sounds.   Pulmonary/Chest: Effort normal and breath sounds normal.  Abdominal: Soft. Bowel sounds are normal.  Neurological: He is alert and oriented to person, place, and time.  Skin: Skin is warm and dry. Capillary refill takes less than 2 seconds.  Psychiatric: He has a normal mood and affect. His behavior is normal.  Nursing note and vitals reviewed.    Musculoskeletal Exam: C-spine and thoracic lumbar spine good range of motion. He had painful range of motion of his left shoulder with full abduction. Elbow joints wrist joints are good range of motion. He has some DIP PIP thickening in his hands. It good range of motion of his hip joints knee joints ankles and MTP joints with no synovitis.  CDAI Exam: No CDAI exam completed.    Investigation: No additional findings.   Imaging: No results found.  Speciality Comments:  No specialty comments available.    Procedures:  Large Joint Inj Date/Time: 09/27/2016 3:44 PM Performed by: Bo Merino Authorized by: Bo Merino   Consent Given by:  Patient Site marked: the procedure site was marked   Timeout: prior to procedure the correct patient, procedure, and site was verified   Indications:  Pain Location:  Shoulder Site:  L glenohumeral Prep: patient was prepped and draped in usual sterile fashion   Needle Size:  27 G Needle Length:  1.5 inches Approach:  Posterior Ultrasound Guidance: No   Fluoroscopic Guidance: No   Arthrogram: No   Medications:  1 mL lidocaine 1 %; 40 mg triamcinolone acetonide 40 MG/ML Aspiration Attempted: Yes   Aspirate amount (mL):  0 Patient tolerance:  Patient tolerated the procedure well with no immediate complications   Allergies: Patient has no known allergies.   Assessment / Plan:     Visit Diagnoses: Rheumatoid factor positive - RF 23, anti-CCP negative, ultrasound negative for synovitis  Primary osteoarthritis of right hip - Mild  Trochanteric bursitis of right hip: He is doing better after the cortisone injection.  Primary osteoarthritis of right knee - Mild: Chronic discomfort. Weight loss diet and exercise was discussed at length.  Primary osteoarthritis of both hands - Mild  Pain in joint of left shoulder: Different treatment options and side effects were discussed. He wanted to proceed with cortisone injection. I've advised him to monitor his blood pressure and blood sugar closely. The left shoulder joint was injected with cortisone and the procedure is described above he tolerated the procedure well.  Degenerative disc disease, lumbar: Chronic pain  Possible gout. His last uric acid was mildly elevated. He gives history of episodes of gout about once a year which responded to colchicine. I discussed possible use of allopurinol but he declined.  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 Large Joint Injection/Arthrocentesis   No orders of the defined types were placed in this encounter.   Face-to-face time spent with patient was 30 minutes. 50% of time was spent in counseling and coordination of care.  Follow-Up Instructions: Return in about 6 months (around 03/30/2017) for Osteoarthritis.   Bo Merino, MD  Note - This record has been created using Editor, commissioning.  Chart creation errors have been sought, but may not always  have been located. Such creation errors do not reflect on  the standard of medical care.

## 2016-09-27 ENCOUNTER — Ambulatory Visit (INDEPENDENT_AMBULATORY_CARE_PROVIDER_SITE_OTHER): Payer: Medicare Other | Admitting: Rheumatology

## 2016-09-27 ENCOUNTER — Encounter: Payer: Self-pay | Admitting: Rheumatology

## 2016-09-27 VITALS — BP 132/67 | HR 73 | Resp 14 | Ht 64.0 in | Wt 278.0 lb

## 2016-09-27 DIAGNOSIS — Z86010 Personal history of colonic polyps: Secondary | ICD-10-CM | POA: Diagnosis not present

## 2016-09-27 DIAGNOSIS — G8929 Other chronic pain: Secondary | ICD-10-CM

## 2016-09-27 DIAGNOSIS — M1711 Unilateral primary osteoarthritis, right knee: Secondary | ICD-10-CM | POA: Diagnosis not present

## 2016-09-27 DIAGNOSIS — Z8679 Personal history of other diseases of the circulatory system: Secondary | ICD-10-CM

## 2016-09-27 DIAGNOSIS — Z8639 Personal history of other endocrine, nutritional and metabolic disease: Secondary | ICD-10-CM

## 2016-09-27 DIAGNOSIS — E559 Vitamin D deficiency, unspecified: Secondary | ICD-10-CM | POA: Diagnosis not present

## 2016-09-27 DIAGNOSIS — M19041 Primary osteoarthritis, right hand: Secondary | ICD-10-CM | POA: Diagnosis not present

## 2016-09-27 DIAGNOSIS — M25512 Pain in left shoulder: Secondary | ICD-10-CM

## 2016-09-27 DIAGNOSIS — M7061 Trochanteric bursitis, right hip: Secondary | ICD-10-CM | POA: Diagnosis not present

## 2016-09-27 DIAGNOSIS — M19042 Primary osteoarthritis, left hand: Secondary | ICD-10-CM

## 2016-09-27 DIAGNOSIS — M1A0790 Idiopathic chronic gout, unspecified ankle and foot, without tophus (tophi): Secondary | ICD-10-CM

## 2016-09-27 DIAGNOSIS — M1611 Unilateral primary osteoarthritis, right hip: Secondary | ICD-10-CM

## 2016-09-27 DIAGNOSIS — Z8669 Personal history of other diseases of the nervous system and sense organs: Secondary | ICD-10-CM

## 2016-09-27 DIAGNOSIS — M5136 Other intervertebral disc degeneration, lumbar region: Secondary | ICD-10-CM

## 2016-09-27 DIAGNOSIS — E13311 Other specified diabetes mellitus with unspecified diabetic retinopathy with macular edema: Secondary | ICD-10-CM

## 2016-09-27 DIAGNOSIS — R768 Other specified abnormal immunological findings in serum: Secondary | ICD-10-CM | POA: Diagnosis not present

## 2016-09-27 DIAGNOSIS — Z8546 Personal history of malignant neoplasm of prostate: Secondary | ICD-10-CM

## 2016-09-27 MED ORDER — LIDOCAINE HCL 1 % IJ SOLN
1.0000 mL | INTRAMUSCULAR | Status: AC | PRN
  Administered 2016-09-27: 1 mL

## 2016-09-27 MED ORDER — TRIAMCINOLONE ACETONIDE 40 MG/ML IJ SUSP
40.0000 mg | INTRAMUSCULAR | Status: AC | PRN
  Administered 2016-09-27: 40 mg via INTRA_ARTICULAR

## 2016-10-04 ENCOUNTER — Encounter: Payer: Self-pay | Admitting: Physician Assistant

## 2016-10-05 MED ORDER — TRAMADOL HCL 50 MG PO TABS: 50.0000 mg | ORAL_TABLET | Freq: Three times a day (TID) | ORAL | 0 refills | Status: DC | PRN

## 2016-10-05 NOTE — Telephone Encounter (Signed)
Patient notified via My Chart.  Meds ordered this encounter  Medications  . traMADol (ULTRAM) 50 MG tablet    Sig: Take 1-2 tablets (50-100 mg total) by mouth every 8 (eight) hours as needed for moderate pain or severe pain.    Dispense:  30 tablet    Refill:  0    Order Specific Question:   Supervising Provider    Answer:   Brigitte Pulse, EVA N [[id number]]

## 2016-10-22 ENCOUNTER — Other Ambulatory Visit: Payer: Self-pay | Admitting: Physician Assistant

## 2016-10-22 DIAGNOSIS — E785 Hyperlipidemia, unspecified: Secondary | ICD-10-CM

## 2016-10-22 DIAGNOSIS — E1165 Type 2 diabetes mellitus with hyperglycemia: Secondary | ICD-10-CM

## 2016-10-22 DIAGNOSIS — IMO0002 Reserved for concepts with insufficient information to code with codable children: Secondary | ICD-10-CM

## 2016-10-25 ENCOUNTER — Ambulatory Visit: Payer: Self-pay | Admitting: Neurology

## 2016-11-15 ENCOUNTER — Ambulatory Visit (INDEPENDENT_AMBULATORY_CARE_PROVIDER_SITE_OTHER): Payer: Medicare Other | Admitting: Physician Assistant

## 2016-11-15 ENCOUNTER — Encounter: Payer: Self-pay | Admitting: Physician Assistant

## 2016-11-15 VITALS — BP 135/80 | HR 94 | Temp 98.0°F | Resp 18 | Ht 64.0 in | Wt 270.8 lb

## 2016-11-15 DIAGNOSIS — E11319 Type 2 diabetes mellitus with unspecified diabetic retinopathy without macular edema: Secondary | ICD-10-CM

## 2016-11-15 DIAGNOSIS — M1611 Unilateral primary osteoarthritis, right hip: Secondary | ICD-10-CM | POA: Diagnosis not present

## 2016-11-15 DIAGNOSIS — I10 Essential (primary) hypertension: Secondary | ICD-10-CM

## 2016-11-15 DIAGNOSIS — M7061 Trochanteric bursitis, right hip: Secondary | ICD-10-CM

## 2016-11-15 DIAGNOSIS — E785 Hyperlipidemia, unspecified: Secondary | ICD-10-CM | POA: Diagnosis not present

## 2016-11-15 DIAGNOSIS — M1711 Unilateral primary osteoarthritis, right knee: Secondary | ICD-10-CM

## 2016-11-15 MED ORDER — ROLLATOR MISC: 0 refills | Status: DC

## 2016-11-15 MED ORDER — ROLLATOR MISC: 0 refills | Status: AC

## 2016-11-15 NOTE — Assessment & Plan Note (Signed)
Await labs. Adjust regimen as indicated by results. Encouraged healthy eating and increased exercise.

## 2016-11-15 NOTE — Assessment & Plan Note (Signed)
Continue follow-up with rheumatology and ortho regarding this issue.

## 2016-11-15 NOTE — Patient Instructions (Addendum)
Please schedule an eye exam.    IF you received an x-ray today, you will receive an invoice from [facility]. Please contact [facility] at [phone] with questions or concerns regarding your invoice.   IF you received labwork today, you will receive an invoice from [facility]. Please contact [facility] at [phone] with questions or concerns regarding your invoice.   Our billing staff will not be able to assist you with questions regarding bills from these companies.  You will be contacted with the lab results as soon as they are available. The fastest way to get your results is to activate your My Chart account. Instructions are located on the last page of this paperwork. If you have not heard from [place] regarding the results in 2 weeks, please contact this office.

## 2016-11-15 NOTE — Assessment & Plan Note (Signed)
He's lost another 7-8 lbs since 09/2016. I hope that this is due to improved lifestyle, but have some concern that this is due to uncontrolled diabetes. Await lab results.

## 2016-11-15 NOTE — Assessment & Plan Note (Signed)
Await labs. Adjust regimen as indicated by results.  

## 2016-11-15 NOTE — Progress Notes (Signed)
   Patient ID: Rodney Walsh, male    DOB: 05/28/1950, 66 y.o.   MRN: [id number]  PCP: , , PA-C  Chief Complaint  Patient presents with  . Diabetes    per pt sugars at home have been in the 127s.  . Follow-up    Subjective:   Presents for evaluation of diabetes, HTN, hyperlipidemia and obesity.  He believes he is doing well from that standpoint. Reports that he is losing inches, but not lbs. Not experiencing CP, SOB, HA, dizziness. No nausea, vomiting, diarrhea. Constipation if he doesn't get enough fiber. Urinary urgency is much improved, and he takes oxybutynin 10 mg BID rather than 5 mg QID. No polydipsia.  Stopped meloxicam, went back to naproxen. Initially it helped a lot, but the effect has waned some. The Tramadol "just made me high." Balance is an issue, due to the pain in the RIGHT hip. Requests a rollator. His wife has started using one, and he finds it helpful.  Review of Systems Denies chest pain, shortness of breath, HA, dizziness, vision change, nausea, vomiting, diarrhea, constipation, melena, hematochezia, dysuria, increased urinary urgency or frequency, increased hunger or thirst, unintentional weight change, unexplained myalgias or arthralgias, rash.     Patient Active Problem List   Diagnosis Date Noted  . Polyarthralgia 09/21/2016  . Trochanteric bursitis of right hip 09/21/2016  . Primary osteoarthritis of both hands 09/21/2016  . Rheumatoid factor positive 09/21/2016  . Primary osteoarthritis of right hip 06/02/2016  . Primary osteoarthritis of right knee 06/02/2016  . Urinary frequency 03/15/2016  . Pain in joint, shoulder region 03/15/2016  . Degenerative disc disease, lumbar 10/13/2015  . 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 Diabetic retinopathy ([facility]) 10/04/2015  . Nocturia 06/10/2014  . Cataract 09/25/2012  . OSA (obstructive sleep apnea) 08/10/2012  . Eczema 08/10/2012  . Vitamin D deficiency  08/10/2012  . Erectile dysfunction 08/10/2012  . Personal history of adenomatous colonic polyps 04/08/2012  . Prostate cancer ([facility]) 01/04/2012  . Diabetes type 2, controlled ([facility]) 10/25/2011  . HTN (hypertension) 10/25/2011  . Hyperlipidemia LDL goal <70 10/25/2011  . Morbid obesity ([facility]) 10/25/2011     Prior to Admission medications   Medication Sig Start Date End Date Taking? Authorizing Provider  aspirin 81 MG tablet Take 81 mg by mouth daily.   Yes [provider]  Blood Glucose Monitoring Suppl (ONE TOUCH ULTRA SYSTEM KIT) w/Device KIT Use to test blood sugar daily. 10/21/15  Yes , , PA-C  lisinopril (PRINIVIL,ZESTRIL) 20 MG tablet TAKE ONE TABLET BY MOUTH DAILY. 07/22/16  Yes , , PA-C  metFORMIN (GLUCOPHAGE) 1000 MG tablet TAKE ONE TABLET BY MOUTH TWICE DAILY WITH A MEAL PATIENT TAKING 1000 mg QD 10/24/16  Yes , , PA-C  oxybutynin (DITROPAN) 5 MG tablet Take 1 tablet (5 mg total) by mouth 4 (four) times daily. 08/16/16  Yes , , PA-C  Vitamin D, Ergocalciferol, (DRISDOL) 50000 units CAPS capsule TAKE 1 CAPSULE BY MOUTH ONCE A WEEK 09/20/16  Yes , , PA-C  clobetasol ointment (TEMOVATE) 0.05 % Apply 1 application topically 2 (two) times daily. To the lesion on the LEG. Patient not taking: Reported on 08/16/2016 02/09/16   , , PA-C  colchicine 0.6 MG tablet Take 1 tablet (0.6 mg total) by mouth 2 (two) times daily. Patient not taking: Reported on 11/15/2016 06/06/16   , , PA-C  gabapentin (NEURONTIN) 600 MG tablet Take 300 mg by mouth 4 (four) times daily.      [provider]  glucosamine-chondroitin 500-400 MG tablet Take 1 tablet by mouth 3 (three) times daily.    [provider]  meloxicam (MOBIC) 15 MG tablet Take 1 tablet (15 mg total) by mouth daily. Patient not taking: Reported on 11/15/2016 08/16/16   [name], PA-C  Multiple Vitamin (MULTIVITAMIN) tablet Take 1  tablet by mouth daily.    [provider]  traMADol (ULTRAM) 50 MG tablet Take 1-2 tablets (50-100 mg total) by mouth every 8 (eight) hours as needed for moderate pain or severe pain. Patient not taking: Reported on 11/15/2016 10/05/16   [name], PA-C     No Known Allergies     Objective:  Physical Exam  Constitutional: He is oriented to person, place, and time. He appears well-developed and well-nourished. He is active and cooperative. No distress.  BP 135/80 (BP Location: Right Arm, Patient Position: Sitting, Cuff Size: Large)   Pulse 94   Temp 98 F (36.7 C) (Oral)   Resp 18   Ht 5' 4" (1.626 m)   Wt 270 lb 12.8 oz (122.8 kg)   SpO2 96%   BMI 46.48 kg/m   HENT:  Head: Normocephalic and atraumatic.  Right Ear: Hearing normal.  Left Ear: Hearing normal.  Eyes: Conjunctivae are normal. No scleral icterus.  Neck: Normal range of motion. Neck supple. No thyromegaly present.  Cardiovascular: Normal rate, regular rhythm and normal heart sounds.   Pulses:      Radial pulses are 2+ on the right side, and 2+ on the left side.  Pulmonary/Chest: Effort normal and breath sounds normal.  Lymphadenopathy:       Head (right side): No tonsillar, no preauricular, no posterior auricular and no occipital adenopathy present.       Head (left side): No tonsillar, no preauricular, no posterior auricular and no occipital adenopathy present.    He has no cervical adenopathy.       Right: No supraclavicular adenopathy present.       Left: No supraclavicular adenopathy present.  Neurological: He is alert and oriented to person, place, and time. No sensory deficit.  Skin: Skin is warm, dry and intact. No rash noted. No cyanosis or erythema. Nails show no clubbing.  Psychiatric: He has a normal mood and affect. His speech is normal and behavior is normal.   Diabetic Foot Exam - Simple   Simple Foot Form Diabetic Foot exam was performed with the following findings:  Yes 11/15/2016   9:58 AM  Visual Inspection No deformities, no ulcerations, no other skin breakdown bilaterally:  Yes Sensation Testing Intact to touch and monofilament testing bilaterally:  Yes Pulse Check Posterior Tibialis and Dorsalis pulse intact bilaterally:  Yes Comments     Wt Readings from Last 3 Encounters:  11/15/16 270 lb 12.8 oz (122.8 kg)  09/27/16 278 lb (126.1 kg)  08/16/16 284 lb (128.8 kg)       Assessment & Plan:   Problem List Items Addressed This Visit    Diabetes type 2, controlled ([place]) (Chronic)    Await labs. Adjust regimen as indicated by results. Encouraged healthy eating and increased exercise.      Relevant Orders   HM DIABETES EYE EXAM (Completed)   HM DIABETES FOOT EXAM (Completed)   Comprehensive metabolic panel   Hemoglobin A1c   Microalbumin, urine   HTN (hypertension) - Primary (Chronic)    Controlled. Continue current treatment.      Relevant Orders   CBC with Differential/Platelet   Comprehensive  metabolic panel   Hyperlipidemia LDL goal <70 (Chronic)    Await labs. Adjust regimen as indicated by results.       Relevant Orders   Comprehensive metabolic panel   Lipid panel   Morbid obesity ([place]) (Chronic)    He's lost another 7-8 lbs since 09/2016. I hope that this is due to improved lifestyle, but have some concern that this is due to uncontrolled diabetes. Await lab results.      Primary osteoarthritis of right hip    Continue follow-up with rheumatology and ortho regarding this issue.       Relevant Medications   naproxen sodium (ANAPROX) 220 MG tablet   Misc. Devices (ROLLATOR) MISC   Primary osteoarthritis of right knee    Continue follow-up with rheumatology and ortho regarding this issue.       Relevant Medications   naproxen sodium (ANAPROX) 220 MG tablet   Misc. Devices (ROLLATOR) MISC   Trochanteric bursitis of right hip    Continue follow-up with rheumatology and ortho regarding this issue.       Relevant Medications     Misc. Devices (ROLLATOR) MISC       Return in about 3 months (around 02/15/2017) for re-evaluation of diabetes, blood pressure, cholesterol.   Fara Chute, PA-C [facility] [place]

## 2016-11-15 NOTE — Addendum Note (Signed)
Addended by: Fara Chute on: 11/15/2016 12:28 PM   Modules accepted: Orders

## 2016-11-15 NOTE — Assessment & Plan Note (Signed)
Controlled. Continue current treatment. 

## 2016-11-16 LAB — CBC WITH DIFFERENTIAL/PLATELET
BASOS: 1 %
Basophils Absolute: 0 10*3/uL (ref 0.0–0.2)
EOS (ABSOLUTE): 0.1 10*3/uL (ref 0.0–0.4)
EOS: 2 %
HEMATOCRIT: 38.8 % (ref 37.5–51.0)
Hemoglobin: 12.8 g/dL — ABNORMAL LOW (ref 13.0–17.7)
Immature Grans (Abs): 0 10*3/uL (ref 0.0–0.1)
Immature Granulocytes: 0 %
LYMPHS ABS: 1 10*3/uL (ref 0.7–3.1)
Lymphs: 24 %
MCH: 30.1 pg (ref 26.6–33.0)
MCHC: 33 g/dL (ref 31.5–35.7)
MCV: 91 fL (ref 79–97)
MONOS ABS: 0.5 10*3/uL (ref 0.1–0.9)
Monocytes: 12 %
NEUTROS ABS: 2.6 10*3/uL (ref 1.4–7.0)
Neutrophils: 61 %
PLATELETS: 250 10*3/uL (ref 150–379)
RBC: 4.25 x10E6/uL (ref 4.14–5.80)
RDW: 13.8 % (ref 12.3–15.4)
WBC: 4.3 10*3/uL (ref 3.4–10.8)

## 2016-11-16 LAB — COMPREHENSIVE METABOLIC PANEL
ALT: 11 IU/L (ref 0–44)
AST: 16 IU/L (ref 0–40)
Albumin/Globulin Ratio: 1.6 (ref 1.2–2.2)
Albumin: 4.2 g/dL (ref 3.6–4.8)
Alkaline Phosphatase: 68 IU/L (ref 39–117)
BUN/Creatinine Ratio: 15 (ref 10–24)
BUN: 13 mg/dL (ref 8–27)
CHLORIDE: 101 mmol/L (ref 96–106)
CO2: 20 mmol/L (ref 20–29)
Calcium: 9.5 mg/dL (ref 8.6–10.2)
Creatinine, Ser: 0.89 mg/dL (ref 0.76–1.27)
GFR, EST AFRICAN AMERICAN: 103 mL/min/{1.73_m2} (ref >59)
GFR, EST NON AFRICAN AMERICAN: 89 mL/min/{1.73_m2} (ref >59)
GLOBULIN, TOTAL: 2.7 g/dL (ref 1.5–4.5)
Glucose: 137 mg/dL — ABNORMAL HIGH (ref 65–99)
POTASSIUM: 4.9 mmol/L (ref 3.5–5.2)
SODIUM: 137 mmol/L (ref 134–144)
Total Protein: 6.9 g/dL (ref 6.0–8.5)

## 2016-11-16 LAB — LIPID PANEL
CHOL/HDL RATIO: 3.5 ratio (ref 0.0–5.0)
Cholesterol, Total: 150 mg/dL (ref 100–199)
HDL: 43 mg/dL (ref >39)
LDL Calculated: 82 mg/dL (ref 0–99)
Triglycerides: 123 mg/dL (ref 0–149)
VLDL Cholesterol Cal: 25 mg/dL (ref 5–40)

## 2016-11-16 LAB — HEMOGLOBIN A1C
Est. average glucose Bld gHb Est-mCnc: 137 mg/dL
HEMOGLOBIN A1C: 6.4 % — AB (ref 4.8–5.6)

## 2016-11-16 LAB — MICROALBUMIN, URINE: MICROALBUM., U, RANDOM: 8.4 ug/mL

## 2016-11-21 ENCOUNTER — Encounter: Payer: Self-pay | Admitting: Physician Assistant

## 2016-11-22 ENCOUNTER — Ambulatory Visit (INDEPENDENT_AMBULATORY_CARE_PROVIDER_SITE_OTHER): Payer: Medicare Other

## 2016-11-22 ENCOUNTER — Ambulatory Visit (INDEPENDENT_AMBULATORY_CARE_PROVIDER_SITE_OTHER): Payer: Medicare Other | Admitting: Physician Assistant

## 2016-11-22 ENCOUNTER — Encounter: Payer: Self-pay | Admitting: Physician Assistant

## 2016-11-22 VITALS — BP 127/81 | HR 103 | Temp 97.4°F | Resp 18 | Ht 64.0 in | Wt 270.0 lb

## 2016-11-22 DIAGNOSIS — M5136 Other intervertebral disc degeneration, lumbar region: Secondary | ICD-10-CM

## 2016-11-22 DIAGNOSIS — R351 Nocturia: Secondary | ICD-10-CM | POA: Diagnosis not present

## 2016-11-22 DIAGNOSIS — M545 Low back pain: Secondary | ICD-10-CM | POA: Diagnosis not present

## 2016-11-22 DIAGNOSIS — M5441 Lumbago with sciatica, right side: Secondary | ICD-10-CM | POA: Diagnosis not present

## 2016-11-22 LAB — POCT URINALYSIS DIP (MANUAL ENTRY)
BILIRUBIN UA: NEGATIVE
BILIRUBIN UA: NEGATIVE mg/dL
GLUCOSE UA: NEGATIVE mg/dL
Leukocytes, UA: NEGATIVE
Nitrite, UA: NEGATIVE
PH UA: 7 (ref 5.0–8.0)
Protein Ur, POC: NEGATIVE mg/dL
RBC UA: NEGATIVE
SPEC GRAV UA: 1.01 (ref 1.010–1.025)
Urobilinogen, UA: 0.2 E.U./dL

## 2016-11-22 MED ORDER — HYDROCODONE-ACETAMINOPHEN 7.5-325 MG PO TABS: 1.0000 | ORAL_TABLET | Freq: Four times a day (QID) | ORAL | 0 refills | Status: DC | PRN

## 2016-11-22 MED ORDER — CYCLOBENZAPRINE HCL 10 MG PO TABS: 10.0000 mg | ORAL_TABLET | Freq: Three times a day (TID) | ORAL | 0 refills | Status: DC | PRN

## 2016-11-22 NOTE — Progress Notes (Signed)
Patient ID: Rodney Walsh, male    DOB: 15-May-1950, 66 y.o.   MRN: [id number]  PCP: [name], PA-C  Chief Complaint  Patient presents with  . Back Pain    per pt pain started Sunday night, lower back pain down right leg, pt states it doesn't hurt to pee and no burning.    Subjective:   Presents for evaluation of back pain x 3 days.  Pain began 7/15 in the RIGHT lower back, radiating down the RIGHT leg. He has known DDD of the lumbar spine, RIGHT trochanteric bursitis and RIGHT hip and knee OA.  THis pain is stabbing, worsened by movement, lessened by remaining still. Rates 11/10, with 0= no pain and 10=the worst pain he can imagine. NSAIDS have been minimally effective. He has tried 3 doses of his wife's Percocet with good relief, though temporary. Pain is making sit-to-stand and ambulation difficult, and he has advanced from a cane to a rollator.  He last had pain like this when he had a kidney infection, so he has been drinking cranberry juice.  No loss of bowel or bladder control, urinary symptoms are now controlled on oxybutynin. No saddle anesthesia. No paresthesia in the legs. No weakness in the legs. No hematuria. No increased urinary urgency, frequency. No dysuria.    Review of Systems As above. No CP, SOB, HA, dizziness. No nausea, vomiting, diarrhea.     Patient Active Problem List   Diagnosis Date Noted  . Polyarthralgia 09/21/2016  . Trochanteric bursitis of right hip 09/21/2016  . Primary osteoarthritis of both hands 09/21/2016  . Rheumatoid factor positive 09/21/2016  . Primary osteoarthritis of right hip 06/02/2016  . Primary osteoarthritis of right knee 06/02/2016  . Urinary frequency 03/15/2016  . Pain in joint, shoulder region 03/15/2016  . Degenerative disc disease, lumbar 10/13/2015  . 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 Diabetic retinopathy ([place]) 10/04/2015  . Nocturia 06/10/2014  . Cataract  09/25/2012  . OSA (obstructive sleep apnea) 08/10/2012  . Eczema 08/10/2012  . Vitamin D deficiency 08/10/2012  . Erectile dysfunction 08/10/2012  . Personal history of adenomatous colonic polyps 04/08/2012  . History of prostate cancer 01/04/2012  . Diabetes type 2, controlled ([place]) 10/25/2011  . HTN (hypertension) 10/25/2011  . Hyperlipidemia LDL goal <70 10/25/2011  . Morbid obesity ([place]) 10/25/2011     Prior to Admission medications   Medication Sig Start Date End Date Taking? Authorizing Provider  aspirin 81 MG tablet Take 81 mg by mouth daily.   Yes [provider]  Blood Glucose Monitoring Suppl (ONE TOUCH ULTRA SYSTEM KIT) w/Device KIT Use to test blood sugar daily. 10/21/15  Yes Kimmi Acocella, PA-C  gabapentin (NEURONTIN) 600 MG tablet Take 300 mg by mouth 4 (four) times daily.   Yes [provider]  glucosamine-chondroitin 500-400 MG tablet Take 1 tablet by mouth 3 (three) times daily.   Yes [provider]  lisinopril (PRINIVIL,ZESTRIL) 20 MG tablet TAKE ONE TABLET BY MOUTH DAILY. 07/22/16  Yes Mariel Gaudin, PA-C  metFORMIN (GLUCOPHAGE) 1000 MG tablet TAKE ONE TABLET BY MOUTH TWICE DAILY WITH A MEAL 10/24/16  Yes Faizan Geraci, PA-C  Misc. Devices (ROLLATOR) MISC Use with ambulation. 11/15/16  Yes Gennavieve Huq, PA-C  Multiple Vitamin (MULTIVITAMIN) tablet Take 1 tablet by mouth daily.   Yes [provider]  naproxen sodium (ANAPROX) 220 MG tablet Take 220 mg by mouth 2 (two) times daily with a meal.   Yes  [provider]  oxybutynin (DITROPAN) 5 MG tablet Take 1 tablet (5 mg total) by mouth 4 (four) times daily. 08/16/16  Yes Janal Haak, PA-C  Vitamin D, Ergocalciferol, (DRISDOL) 50000 units CAPS capsule TAKE 1 CAPSULE BY MOUTH ONCE A WEEK 09/20/16  Yes Jadelynn Boylan, PA-C  clobetasol ointment (TEMOVATE) 7.51 % Apply 1 application topically 2 (two) times daily. To the lesion on the LEG. Patient not taking: Reported on  08/16/2016 02/09/16   [name], PA-C  colchicine 0.6 MG tablet Take 1 tablet (0.6 mg total) by mouth 2 (two) times daily. Patient not taking: Reported on 11/15/2016 06/06/16   [name], PA-C     No Known Allergies     Objective:  Physical Exam  Constitutional: He is oriented to person, place, and time. He appears well-developed and well-nourished. He is active and cooperative. No distress.  BP 127/81 (BP Location: Right Arm, Patient Position: Sitting, Cuff Size: Large)   Pulse (!) 103   Temp (!) 97.4 F (36.3 C) (Oral)   Resp 18   Ht '5\' 4"'  (1.626 m)   Wt 270 lb (122.5 kg)   SpO2 98%   BMI 46.35 kg/m  Seated in a wheel chair, but relates that he drove himself to today's visit.  HENT:  Head: Normocephalic and atraumatic.  Right Ear: Hearing normal.  Left Ear: Hearing normal.  Eyes: Conjunctivae are normal. No scleral icterus.  Neck: Normal range of motion. Neck supple. No thyromegaly present.  Cardiovascular: Normal rate, regular rhythm and normal heart sounds.   Pulses:      Radial pulses are 2+ on the right side, and 2+ on the left side.  Pulmonary/Chest: Effort normal and breath sounds normal.  Musculoskeletal:       Thoracic back: Normal.       Lumbar back: He exhibits decreased range of motion, tenderness and pain. He exhibits no bony tenderness, no swelling, no edema, no deformity, no laceration, no spasm and normal pulse.       Back:  Lymphadenopathy:       Head (right side): No tonsillar, no preauricular, no posterior auricular and no occipital adenopathy present.       Head (left side): No tonsillar, no preauricular, no posterior auricular and no occipital adenopathy present.    He has no cervical adenopathy.       Right: No supraclavicular adenopathy present.       Left: No supraclavicular adenopathy present.  Neurological: He is alert and oriented to person, place, and time. No sensory deficit.  Skin: Skin is warm, dry and intact. No rash noted. No  cyanosis or erythema. Nails show no clubbing.  Psychiatric: He has a normal mood and affect. His speech is normal and behavior is normal.           Assessment & Plan:   Problem List Items Addressed This Visit    Nocturia (Chronic)    Improved with oxybutynin, not worsened with current increase in back pain. He is concerned that he has a kidney infection. Await UCx.      Relevant Orders   Urine Culture (Completed)   CBC with Differential/Platelet (Completed)   Basic metabolic panel (Completed)   Degenerative disc disease, lumbar    Update radiographs, last performed 2017. NSAIDS. Cyclobenzaprine. Hydrocodone.      Relevant Medications   cyclobenzaprine (FLEXERIL) 10 MG tablet   HYDROcodone-acetaminophen (NORCO) 7.5-325 MG tablet   Other Relevant Orders   DG Lumbar Spine Complete (Completed)  Other Visit Diagnoses    Acute right-sided low back pain with right-sided sciatica    -  Primary   Doubt urinary cause. Update imaging. May need follow up with ortho.   Relevant Medications   cyclobenzaprine (FLEXERIL) 10 MG tablet   HYDROcodone-acetaminophen (NORCO) 7.5-325 MG tablet   Other Relevant Orders   POCT urinalysis dipstick (Completed)   DG Lumbar Spine Complete (Completed)       Return in about 3 months (around 02/22/2017) for blood pressure, cholesterol, and diabetes follow up, as planned 02/2017.   Fara Chute, PA-C [facility]

## 2016-11-22 NOTE — Patient Instructions (Addendum)
Call [facility]. See if they have a better deal for you to get the CPAP.    IF you received an x-ray today, you will receive an invoice from [facility]. Please contact [facility] at [phone] with questions or concerns regarding your invoice.   IF you received labwork today, you will receive an invoice from [facility]. Please contact [facility] at [phone] with questions or concerns regarding your invoice.   Our billing staff will not be able to assist you with questions regarding bills from these companies.  You will be contacted with the lab results as soon as they are available. The fastest way to get your results is to activate your My Chart account. Instructions are located on the last page of this paperwork. If you have not heard from [place] regarding the results in 2 weeks, please contact this office.

## 2016-11-22 NOTE — Progress Notes (Signed)
Subjective:    Patient ID: Rodney Walsh, male    DOB: 1951-02-17, 66 y.o.   MRN: [id number] PCP: [name], PA-C   HPI Rodney Walsh is a 66 year old black male with a history of degenerative disc disease, right-sided trochanteric bursitis, and right sided hip and knee osteoarthritis presenting with low back pain.  The pain onset 2 days ago in his right lower back. He describes the pain as stabbing. It radiates down his right leg. Exacerbated by movement and alleviated with remaining still. He reports that his wife has percocet. He has taken three total, and this has helped ease some of the pain. Takes aleve morning and night for osteoarthritis, but did not take while trying percocet because he was unsure if he could take them together. Difficulty with standing and walking. He usually uses a cane or a walker for assistance with ambulation. Pain is "excruciating." Severity is 11/10.  He says this feels similar to kidney infection that he has had before. Tried drinking cranberry juice without relief.  Denies changes in urinary habits, dysuria, hematuria, bowel or bladder incontinence (with continued oxybutynin use for overactive bladder). Denies saddle anesthesia or numbness or tingling in lower extremities. Denies chest pain, dyspnea, SOB.  Imaging of lumbar spine last performed 10/2015 consistent with multilevel degenerative changes.   Patient Active Problem List   Diagnosis Date Noted  . Polyarthralgia 09/21/2016  . Trochanteric bursitis of right hip 09/21/2016  . Primary osteoarthritis of both hands 09/21/2016  . Rheumatoid factor positive 09/21/2016  . Primary osteoarthritis of right hip 06/02/2016  . Primary osteoarthritis of right knee 06/02/2016  . Urinary frequency 03/15/2016  . Pain in joint, shoulder region 03/15/2016  . Degenerative disc disease, lumbar 10/13/2015  . 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 Diabetic retinopathy ([place])  10/04/2015  . Nocturia 06/10/2014  . Cataract 09/25/2012  . OSA (obstructive sleep apnea) 08/10/2012  . Eczema 08/10/2012  . Vitamin D deficiency 08/10/2012  . Erectile dysfunction 08/10/2012  . Personal history of adenomatous colonic polyps 04/08/2012  . History of prostate cancer 01/04/2012  . Diabetes type 2, controlled ([place]) 10/25/2011  . HTN (hypertension) 10/25/2011  . Hyperlipidemia LDL goal <70 10/25/2011  . Morbid obesity ([place]) 10/25/2011   Prior to Admission medications   Medication Sig Start Date End Date Taking? Authorizing Provider  aspirin 81 MG tablet Take 81 mg by mouth daily.   Yes [provider]  Blood Glucose Monitoring Suppl (ONE TOUCH ULTRA SYSTEM KIT) w/Device KIT Use to test blood sugar daily. 10/21/15  Yes Jeffery, Chelle, PA-C  gabapentin (NEURONTIN) 600 MG tablet Take 300 mg by mouth 4 (four) times daily.   Yes [provider]  glucosamine-chondroitin 500-400 MG tablet Take 1 tablet by mouth 3 (three) times daily.   Yes [provider]  lisinopril (PRINIVIL,ZESTRIL) 20 MG tablet TAKE ONE TABLET BY MOUTH DAILY. 07/22/16  Yes Jeffery, Chelle, PA-C  metFORMIN (GLUCOPHAGE) 1000 MG tablet TAKE ONE TABLET BY MOUTH TWICE DAILY WITH A MEAL 10/24/16  Yes Jeffery, Chelle, PA-C  Misc. Devices (ROLLATOR) MISC Use with ambulation. 11/15/16  Yes Jeffery, Chelle, PA-C  Multiple Vitamin (MULTIVITAMIN) tablet Take 1 tablet by mouth daily.   Yes [provider]  naproxen sodium (ANAPROX) 220 MG tablet Take 220 mg by mouth 2 (two) times daily with a meal.   Yes [provider]  oxybutynin (DITROPAN) 5 MG tablet Take 1 tablet (5 mg total) by mouth 4 (four) times daily.  08/16/16  Yes Jeffery, Chelle, PA-C  Vitamin D, Ergocalciferol, (DRISDOL) 50000 units CAPS capsule TAKE 1 CAPSULE BY MOUTH ONCE A WEEK 09/20/16  Yes Jeffery, Chelle, PA-C  clobetasol ointment (TEMOVATE) 8.89 % Apply 1 application topically 2 (two) times daily. To the lesion on  the LEG. Patient not taking: Reported on 08/16/2016 02/09/16   [name], PA-C  colchicine 0.6 MG tablet Take 1 tablet (0.6 mg total) by mouth 2 (two) times daily. Patient not taking: Reported on 11/15/2016 06/06/16   [name], PA-C   No Known Allergies  Review of Systems See above.    Objective:   Physical Exam  Constitutional: He appears well-developed and well-nourished.  BP 127/81 (BP Location: Right Arm, Patient Position: Sitting, Cuff Size: Large)   Pulse (!) 103   Temp (!) 97.4 F (36.3 C) (Oral)   Resp 18   Ht '5\' 4"'  (1.626 m)   Wt 270 lb (122.5 kg)   SpO2 98%   BMI 46.35 kg/m    HENT:  Head: Normocephalic and atraumatic.  Right Ear: External ear normal.  Left Ear: External ear normal.  Nose: Nose normal.  Eyes: EOM are normal.  Neck: Normal range of motion. Neck supple.  Cardiovascular: Normal rate, regular rhythm and normal heart sounds.   Pulses:      Radial pulses are 2+ on the right side, and 2+ on the left side.       Dorsalis pedis pulses are 2+ on the right side, and 2+ on the left side.       Posterior tibial pulses are 2+ on the right side, and 2+ on the left side.  Pulmonary/Chest: Effort normal and breath sounds normal.  Musculoskeletal:       Arms: No CVA tenderness. No tenderness to palpation of spinous processes.  Lymphadenopathy:    He has no cervical adenopathy.  Neurological: He is alert.  Skin: Skin is warm and dry. No rash noted. No erythema.  Psychiatric: He has a normal mood and affect. His behavior is normal.      Assessment & Plan:   1. Acute right-sided low back pain with right-sided sciatica Will repeat lumbar radiographs. If persistent degenerative change, plan referral to ortho. Rx cyclobenzaprine. Still has meloxicam at home - advised taking this instead of the aleve. - POCT urinalysis dipstick - DG Lumbar Spine Complete; Future - cyclobenzaprine (FLEXERIL) 10 MG tablet; Take 1 tablet (10 mg total) by mouth 3 (three)  times daily as needed for muscle spasms.  Dispense: 30 tablet; Refill: 0  2. Degenerative disc disease, lumbar Will repeat lumbar radiographs. If persistent degenerative change, plan referral to ortho. Rx hydrocodone-acetaminophen. - DG Lumbar Spine Complete; Future - HYDROcodone-acetaminophen (NORCO) 7.5-325 MG tablet; Take 1 tablet by mouth every 6 (six) hours as needed.  Dispense: 30 tablet; Refill: 0  3. Nocturia - Urine Culture - CBC with Differential/Platelet - Basic metabolic panel    Respectfully, Weldon Picking, PA-S

## 2016-11-23 LAB — CBC WITH DIFFERENTIAL/PLATELET
Basophils Absolute: 0 10*3/uL (ref 0.0–0.2)
Basos: 1 %
EOS (ABSOLUTE): 0.1 10*3/uL (ref 0.0–0.4)
EOS: 2 %
HEMOGLOBIN: 13.2 g/dL (ref 13.0–17.7)
Hematocrit: 39.5 % (ref 37.5–51.0)
Immature Grans (Abs): 0 10*3/uL (ref 0.0–0.1)
Immature Granulocytes: 0 %
LYMPHS ABS: 1.1 10*3/uL (ref 0.7–3.1)
Lymphs: 26 %
MCH: 30.4 pg (ref 26.6–33.0)
MCHC: 33.4 g/dL (ref 31.5–35.7)
MCV: 91 fL (ref 79–97)
MONOCYTES: 12 %
MONOS ABS: 0.5 10*3/uL (ref 0.1–0.9)
NEUTROS ABS: 2.7 10*3/uL (ref 1.4–7.0)
Neutrophils: 59 %
Platelets: 272 10*3/uL (ref 150–379)
RBC: 4.34 x10E6/uL (ref 4.14–5.80)
RDW: 13.3 % (ref 12.3–15.4)
WBC: 4.4 10*3/uL (ref 3.4–10.8)

## 2016-11-23 LAB — BASIC METABOLIC PANEL
BUN/Creatinine Ratio: 10 (ref 10–24)
BUN: 11 mg/dL (ref 8–27)
CO2: 23 mmol/L (ref 20–29)
CREATININE: 1.07 mg/dL (ref 0.76–1.27)
Calcium: 10.3 mg/dL — ABNORMAL HIGH (ref 8.6–10.2)
Chloride: 99 mmol/L (ref 96–106)
GFR calc non Af Amer: 72 mL/min/{1.73_m2} (ref >59)
GFR, EST AFRICAN AMERICAN: 83 mL/min/{1.73_m2} (ref >59)
Glucose: 107 mg/dL — ABNORMAL HIGH (ref 65–99)
Potassium: 5 mmol/L (ref 3.5–5.2)
SODIUM: 138 mmol/L (ref 134–144)

## 2016-11-23 LAB — HM DIABETES EYE EXAM

## 2016-11-24 LAB — URINE CULTURE

## 2016-11-27 NOTE — Assessment & Plan Note (Signed)
Improved with oxybutynin, not worsened with current increase in back pain. He is concerned that he has a kidney infection. Await UCx.

## 2016-11-27 NOTE — Assessment & Plan Note (Signed)
Update radiographs, last performed 2017. NSAIDS. Cyclobenzaprine. Hydrocodone.

## 2016-11-29 ENCOUNTER — Telehealth: Payer: Self-pay | Admitting: Neurology

## 2016-11-29 ENCOUNTER — Telehealth: Payer: Self-pay | Admitting: Rheumatology

## 2016-11-29 NOTE — Telephone Encounter (Signed)
Patient requesting RX for CPAP machine to be sent to [facility]. [facility] will cost too much. Please fax Rx to [phone] Attn. Pam

## 2016-11-29 NOTE — Telephone Encounter (Signed)
Pt's wife called to see if they could get Rodney Walsh cpap and supplies prescribtion [facility]. They are having financial issues [facility] the [facility]. [facility] are aware of what is going on. Their fax number is [phone].

## 2016-11-29 NOTE — Telephone Encounter (Signed)
Patient advised to contact PCP as we do not write prescription for CPAP. Patient verbalized understanding

## 2016-11-29 NOTE — Telephone Encounter (Signed)
I called pt. He reports that he is unable to afford the cpap from [facility] and wants me to send his referral to [facility]. I advised him that he will need to follow up 30-90 days after starting his cpap. Pt declined to make this appt but will call when he is set up on cpap. Will send referral to [facility]. Pt verbalized understanding.

## 2016-11-29 NOTE — Telephone Encounter (Signed)
Attempted to contact the patient and left message for patient to call the office.  

## 2016-12-10 ENCOUNTER — Other Ambulatory Visit: Payer: Self-pay | Admitting: Physician Assistant

## 2016-12-10 DIAGNOSIS — E559 Vitamin D deficiency, unspecified: Secondary | ICD-10-CM

## 2016-12-19 ENCOUNTER — Other Ambulatory Visit: Payer: Self-pay | Admitting: Physician Assistant

## 2016-12-19 DIAGNOSIS — M5441 Lumbago with sciatica, right side: Secondary | ICD-10-CM

## 2016-12-19 DIAGNOSIS — M1611 Unilateral primary osteoarthritis, right hip: Secondary | ICD-10-CM

## 2016-12-28 ENCOUNTER — Telehealth: Payer: Self-pay | Admitting: Neurology

## 2016-12-28 NOTE — Telephone Encounter (Signed)
Rodney Walsh with [facility] is calling regarding CPAP order that is incomplete.

## 2016-12-28 NOTE — Telephone Encounter (Signed)
Have attempted to reach [place] x 2. On hold to speak with someone 5 minutes each time. Will call later this afternoon or tomorrow to follow up on Jule's call.

## 2016-12-29 NOTE — Telephone Encounter (Signed)
RN completed the missing documentation. Fax back to [phone]. Fax confirmed and received.

## 2016-12-29 NOTE — Telephone Encounter (Signed)
Rn call [facility] about pt order being not complete for cpap order. Jackson Latino will fax back to [phone] for Dr. Rexene Alberts to complete.

## 2016-12-30 NOTE — Telephone Encounter (Signed)
Jewels with [facility] called office requesting if we can refax the cpap order due to the fax being cut off.  Please fax to [phone].

## 2017-01-02 NOTE — Telephone Encounter (Signed)
Pt's cpap order was previously sent to [facility] (see prior phone note) and pt had asked me previously to NOT send an order to [facility].  I called pt to discuss. No answer, left a message asking him to call me back.

## 2017-01-02 NOTE — Telephone Encounter (Addendum)
I called pt. He reports that [facility] has already set him up on cpap and he has been using it for 3-4 weeks. He has an appt with Dr. Rexene Alberts on 02/20/17 which I reminded him of. He asked that I shred the order for [facility] because he will not be using them. Please disregard any further communications from [facility] for this pt.

## 2017-01-02 NOTE — Telephone Encounter (Signed)
Can you refax? thx

## 2017-01-02 NOTE — Telephone Encounter (Signed)
Patient returning your call.

## 2017-01-17 ENCOUNTER — Encounter: Payer: Self-pay | Admitting: Physician Assistant

## 2017-01-25 ENCOUNTER — Telehealth: Payer: Self-pay

## 2017-01-25 NOTE — Telephone Encounter (Signed)
I called pt. He needs to be seen between 01/14/17 and 03/15/17 for medicare compliance notes for his cpap start. His current appt is 03/15/17, which is the very last day to be seen. I offered him a sooner appt to make sure he meets his compliance deadline. Pt asked for a Friday appt, and he is agreeable to seeing Rodney Sauer, NP on 03/03/17 at 9:00am. Pt verbalized understanding of new appt date and time, and that the 03/15/17 appt has been cancelled.

## 2017-02-17 ENCOUNTER — Ambulatory Visit (INDEPENDENT_AMBULATORY_CARE_PROVIDER_SITE_OTHER): Payer: Medicare Other | Admitting: Physician Assistant

## 2017-02-17 ENCOUNTER — Encounter: Payer: Self-pay | Admitting: Physician Assistant

## 2017-02-17 VITALS — BP 140/86 | HR 99 | Temp 98.1°F | Resp 18 | Ht 64.0 in | Wt 268.8 lb

## 2017-02-17 DIAGNOSIS — Z23 Encounter for immunization: Secondary | ICD-10-CM

## 2017-02-17 DIAGNOSIS — M2550 Pain in unspecified joint: Secondary | ICD-10-CM

## 2017-02-17 DIAGNOSIS — E785 Hyperlipidemia, unspecified: Secondary | ICD-10-CM

## 2017-02-17 DIAGNOSIS — G4733 Obstructive sleep apnea (adult) (pediatric): Secondary | ICD-10-CM

## 2017-02-17 DIAGNOSIS — E11319 Type 2 diabetes mellitus with unspecified diabetic retinopathy without macular edema: Secondary | ICD-10-CM | POA: Diagnosis not present

## 2017-02-17 DIAGNOSIS — I10 Essential (primary) hypertension: Secondary | ICD-10-CM | POA: Diagnosis not present

## 2017-02-17 DIAGNOSIS — E559 Vitamin D deficiency, unspecified: Secondary | ICD-10-CM | POA: Diagnosis not present

## 2017-02-17 NOTE — Assessment & Plan Note (Signed)
Follow-up with Dr. Estanislado Pandy. Consider contacting her office for an earlier visit if he's having such significant pain.

## 2017-02-17 NOTE — Assessment & Plan Note (Signed)
Follow-up with Dr. Brett Fairy next week, as planned.

## 2017-02-17 NOTE — Assessment & Plan Note (Signed)
Reduced to QD, due to "inconvenient" BMs. If A1C has risen, would consider changing to XR formulation and increasing to 2000 mg QAM.

## 2017-02-17 NOTE — Assessment & Plan Note (Signed)
Await labs. Adjust regimen as indicated by results.  

## 2017-02-17 NOTE — Assessment & Plan Note (Addendum)
Major contributor/cause of most of his other problems. He's down 32 lbs from his maximum. Still struggles with healthy eating choices and exercise is limited due to joint pain. Refer to medical weight management.

## 2017-02-17 NOTE — Assessment & Plan Note (Signed)
Borderline control. Needs to lose weight. Continue current treatment.

## 2017-02-17 NOTE — Patient Instructions (Signed)
     IF you received an x-ray today, you will receive an invoice from [facility]. Please contact [facility] at [phone] with questions or concerns regarding your invoice.   IF you received labwork today, you will receive an invoice from [facility]. Please contact [facility] at [phone] with questions or concerns regarding your invoice.   Our billing staff will not be able to assist you with questions regarding bills from these companies.  You will be contacted with the lab results as soon as they are available. The fastest way to get your results is to activate your My Chart account. Instructions are located on the last page of this paperwork. If you have not heard from us regarding the results in 2 weeks, please contact this office.     

## 2017-02-17 NOTE — Progress Notes (Signed)
Patient ID: Rodney Walsh, male    DOB: August 06, 1950, 66 y.o.   MRN: [id number]  PCP: [name], PA-C  Chief Complaint  Patient presents with  . Diabetes  . Hypertension  . Hyperlipidemia  . Follow-up    Subjective:   Presents for evaluation of diabetes, HTN, hyperlipidemia, etc. He is accompanied by his wife.  He feels pretty good, other than joint pain, such that he is using a cane at home and was brought from the lobby to the exam room in a wheel chair, and uses a Rollator at home.  OSA follow-up next week, to discuss the set-up. Recently had trouble with dry mouth, intolerable.   Has been using Horse Liniment on the RIGHT knee, LEFT shoulder and hips. He thinks it's been helpful. RIGHT hip is worst, pain "every time I take a step." He's seen rheumatology, and received an injection. Next visit is in 03/2017.  Has reduced his metformin dose. He was having readings in the 90's each morning, and "inconvenient" BMs. Reducing the dose to QAM eliminates the problem, and fasting readings are 110's.   Review of Systems As above. No CP, SOB, HA, dizziness, nausea, vomiting, diarrhea, constipation, rash. No fever, chills.    Patient Active Problem List   Diagnosis Date Noted  . Polyarthralgia 09/21/2016  . Trochanteric bursitis of right hip 09/21/2016  . Primary osteoarthritis of both hands 09/21/2016  . Rheumatoid factor positive 09/21/2016  . Primary osteoarthritis of right hip 06/02/2016  . Primary osteoarthritis of right knee 06/02/2016  . Urinary frequency 03/15/2016  . Pain in joint, shoulder region 03/15/2016  . Degenerative disc disease, lumbar 10/13/2015  . Macular edema due to secondary diabetes ([place]) 10/04/2015  . Diabetic retinopathy ([place]) 10/04/2015  . Nocturia 06/10/2014  . Cataract 09/25/2012  . OSA (obstructive sleep apnea) 08/10/2012  . Eczema 08/10/2012  . Vitamin D deficiency 08/10/2012  . Erectile dysfunction 08/10/2012  . Personal  history of adenomatous colonic polyps 04/08/2012  . History of prostate cancer 01/04/2012  . Diabetes type 2, controlled ([place]) 10/25/2011  . HTN (hypertension) 10/25/2011  . Hyperlipidemia LDL goal <70 10/25/2011  . Morbid obesity ([place]) 10/25/2011     Prior to Admission medications   Medication Sig Start Date End Date Taking? Authorizing Provider  aspirin 81 MG tablet Take 81 mg by mouth daily.   Yes [provider]  Blood Glucose Monitoring Suppl (ONE TOUCH ULTRA SYSTEM KIT) w/Device KIT Use to test blood sugar daily. 10/21/15  Yes Cyle Kenyon, PA-C  clobetasol ointment (TEMOVATE) 8.34 % Apply 1 application topically 2 (two) times daily. To the lesion on the LEG. 02/09/16  Yes Stormi Vandevelde, PA-C  colchicine 0.6 MG tablet Take 1 tablet (0.6 mg total) by mouth 2 (two) times daily. 06/06/16  Yes Oluwaseun Bruyere, PA-C  gabapentin (NEURONTIN) 600 MG tablet Take 300 mg by mouth 4 (four) times daily.   Yes [provider]  lisinopril (PRINIVIL,ZESTRIL) 20 MG tablet TAKE ONE TABLET BY MOUTH DAILY. 07/22/16  Yes Erionna Strum, PA-C  metFORMIN (GLUCOPHAGE) 1000 MG tablet TAKE ONE TABLET BY MOUTH TWICE DAILY WITH A MEAL 10/24/16  Yes Fleurette Woolbright, PA-C  Misc. Devices (ROLLATOR) MISC Use with ambulation. 11/15/16  Yes Aven Christen, PA-C  oxybutynin (DITROPAN) 5 MG tablet Take 1 tablet (5 mg total) by mouth 4 (four) times daily. 08/16/16  Yes Miquan Tandon, PA-C  Vitamin D, Ergocalciferol, (DRISDOL) 50000 units CAPS capsule TAKE 1 CAPSULE BY MOUTH ONCE A WEEK  12/14/16  Yes Ticara Waner, PA-C  cyclobenzaprine (FLEXERIL) 10 MG tablet TAKE 1 TABLET BY MOUTH THREE TIMES DAILY AS NEEDED FOR MUSCLE SPASMS Patient not taking: Reported on 02/17/2017 12/19/16   [name], PA-C  glucosamine-chondroitin 500-400 MG tablet Take 1 tablet by mouth 3 (three) times daily.    [provider]  HYDROcodone-acetaminophen (NORCO) 7.5-325 MG tablet Take 1 tablet by mouth every 6 (six)  hours as needed. Patient not taking: Reported on 02/17/2017 11/22/16   [name], PA-C  meloxicam (MOBIC) 15 MG tablet TAKE 1 TABLET BY MOUTH ONCE DAILY Patient not taking: Reported on 02/17/2017 12/20/16   Mancel Bale, PA-C  Multiple Vitamin (MULTIVITAMIN) tablet Take 1 tablet by mouth daily.    [provider]  naproxen sodium (ANAPROX) 220 MG tablet Take 220 mg by mouth 2 (two) times daily with a meal.    [provider]     No Known Allergies     Objective:  Physical Exam  Constitutional: He is oriented to person, place, and time. He appears well-developed and well-nourished. He is active and cooperative. No distress.  BP 140/86 (BP Location: Left Arm, Patient Position: Sitting, Cuff Size: Large)   Pulse 99   Temp 98.1 F (36.7 C) (Oral)   Resp 18   Ht '5\' 4"'  (1.626 m)   Wt 268 lb 12.8 oz (121.9 kg)   SpO2 95%   BMI 46.14 kg/m   HENT:  Head: Normocephalic and atraumatic.  Right Ear: Hearing normal.  Left Ear: Hearing normal.  Eyes: Conjunctivae are normal. No scleral icterus.  Neck: Normal range of motion. Neck supple. No thyromegaly present.  Cardiovascular: Normal rate, regular rhythm and normal heart sounds.   Pulses:      Radial pulses are 2+ on the right side, and 2+ on the left side.  Pulmonary/Chest: Effort normal and breath sounds normal.  Lymphadenopathy:       Head (right side): No tonsillar, no preauricular, no posterior auricular and no occipital adenopathy present.       Head (left side): No tonsillar, no preauricular, no posterior auricular and no occipital adenopathy present.    He has no cervical adenopathy.       Right: No supraclavicular adenopathy present.       Left: No supraclavicular adenopathy present.  Neurological: He is alert and oriented to person, place, and time. No sensory deficit.  Skin: Skin is warm, dry and intact. No rash noted. No cyanosis or erythema. Nails show no clubbing.  Psychiatric: He has a normal mood  and affect. His speech is normal and behavior is normal.       Wt Readings from Last 3 Encounters:  02/17/17 268 lb 12.8 oz (121.9 kg)  11/22/16 270 lb (122.5 kg)  11/15/16 270 lb 12.8 oz (122.8 kg)       Assessment & Plan:   Problem List Items Addressed This Visit    Diabetes type 2, controlled ([place]) - Primary (Chronic)    Reduced to QD, due to "inconvenient" BMs. If A1C has risen, would consider changing to XR formulation and increasing to 2000 mg QAM.      Relevant Orders   Comprehensive metabolic panel   Hemoglobin A1c   HTN (hypertension) (Chronic)    Borderline control. Needs to lose weight. Continue current treatment.      Relevant Orders   CBC with Differential/Platelet   Comprehensive metabolic panel   Hyperlipidemia LDL goal <70 (Chronic)   Relevant Orders  Comprehensive metabolic panel   Lipid panel   Morbid obesity ([facility]) (Chronic)    Major contributor/cause of most of his other problems. He's down 32 lbs from his maximum. Still struggles with healthy eating choices and exercise is limited due to joint pain. Refer to medical weight management.      Relevant Orders   Amb Ref to Medical Weight Management   OSA (obstructive sleep apnea) (Chronic)    Follow-up with Dr. Brett Fairy next week, as planned.      Vitamin D deficiency    Await labs. Adjust regimen as indicated by results.       Relevant Orders   VITAMIN D 25 Hydroxy (Vit-D Deficiency, Fractures)   Polyarthralgia    Follow-up with Dr. Estanislado Pandy. Consider contacting her office for an earlier visit if he's having such significant pain.       Other Visit Diagnoses    Need for influenza vaccination       Relevant Orders   Flu Vaccine QUAD 36+ mos IM (Completed)       Return in about 3 months (around 05/20/2017) for re-evaluation of diabetes, blood pressure, cholesterol.   Fara Chute, PA-C [facility]

## 2017-02-19 LAB — CBC WITH DIFFERENTIAL/PLATELET
BASOS ABS: 0 10*3/uL (ref 0.0–0.2)
Basos: 1 %
EOS (ABSOLUTE): 0.1 10*3/uL (ref 0.0–0.4)
Eos: 3 %
Hematocrit: 40.1 % (ref 37.5–51.0)
Hemoglobin: 13.3 g/dL (ref 13.0–17.7)
IMMATURE GRANS (ABS): 0 10*3/uL (ref 0.0–0.1)
IMMATURE GRANULOCYTES: 0 %
LYMPHS: 25 %
Lymphocytes Absolute: 1 10*3/uL (ref 0.7–3.1)
MCH: 30.4 pg (ref 26.6–33.0)
MCHC: 33.2 g/dL (ref 31.5–35.7)
MCV: 92 fL (ref 79–97)
MONOCYTES: 10 %
MONOS ABS: 0.4 10*3/uL (ref 0.1–0.9)
Neutrophils Absolute: 2.4 10*3/uL (ref 1.4–7.0)
Neutrophils: 61 %
PLATELETS: 245 10*3/uL (ref 150–379)
RBC: 4.38 x10E6/uL (ref 4.14–5.80)
RDW: 13.4 % (ref 12.3–15.4)
WBC: 3.9 10*3/uL (ref 3.4–10.8)

## 2017-02-19 LAB — HEMOGLOBIN A1C
Est. average glucose Bld gHb Est-mCnc: 126 mg/dL
Hgb A1c MFr Bld: 6 % — ABNORMAL HIGH (ref 4.8–5.6)

## 2017-02-19 LAB — LIPID PANEL
CHOL/HDL RATIO: 3.9 ratio (ref 0.0–5.0)
Cholesterol, Total: 164 mg/dL (ref 100–199)
HDL: 42 mg/dL (ref >39)
LDL CALC: 89 mg/dL (ref 0–99)
TRIGLYCERIDES: 165 mg/dL — AB (ref 0–149)
VLDL CHOLESTEROL CAL: 33 mg/dL (ref 5–40)

## 2017-02-19 LAB — COMPREHENSIVE METABOLIC PANEL
A/G RATIO: 1.5 (ref 1.2–2.2)
ALBUMIN: 4.1 g/dL (ref 3.6–4.8)
ALT: 9 IU/L (ref 0–44)
AST: 14 IU/L (ref 0–40)
Alkaline Phosphatase: 74 IU/L (ref 39–117)
BILIRUBIN TOTAL: 0.3 mg/dL (ref 0.0–1.2)
BUN / CREAT RATIO: 13 (ref 10–24)
BUN: 14 mg/dL (ref 8–27)
CALCIUM: 9.5 mg/dL (ref 8.6–10.2)
CHLORIDE: 102 mmol/L (ref 96–106)
CO2: 24 mmol/L (ref 20–29)
Creatinine, Ser: 1.04 mg/dL (ref 0.76–1.27)
GFR calc Af Amer: 86 mL/min/{1.73_m2} (ref >59)
GFR, EST NON AFRICAN AMERICAN: 74 mL/min/{1.73_m2} (ref >59)
GLUCOSE: 125 mg/dL — AB (ref 65–99)
Globulin, Total: 2.7 g/dL (ref 1.5–4.5)
POTASSIUM: 4.7 mmol/L (ref 3.5–5.2)
Sodium: 142 mmol/L (ref 134–144)
TOTAL PROTEIN: 6.8 g/dL (ref 6.0–8.5)

## 2017-02-19 LAB — VITAMIN D 25 HYDROXY (VIT D DEFICIENCY, FRACTURES): Vit D, 25-Hydroxy: 38.8 ng/mL (ref 30.0–100.0)

## 2017-02-20 ENCOUNTER — Ambulatory Visit (INDEPENDENT_AMBULATORY_CARE_PROVIDER_SITE_OTHER): Payer: Medicare Other | Admitting: Rheumatology

## 2017-02-20 ENCOUNTER — Encounter: Payer: Self-pay | Admitting: Rheumatology

## 2017-02-20 ENCOUNTER — Ambulatory Visit: Payer: Medicare Other | Admitting: Neurology

## 2017-02-20 VITALS — BP 137/82 | HR 84 | Ht 64.0 in | Wt 274.0 lb

## 2017-02-20 DIAGNOSIS — M19042 Primary osteoarthritis, left hand: Secondary | ICD-10-CM | POA: Diagnosis not present

## 2017-02-20 DIAGNOSIS — R768 Other specified abnormal immunological findings in serum: Secondary | ICD-10-CM | POA: Diagnosis not present

## 2017-02-20 DIAGNOSIS — M1711 Unilateral primary osteoarthritis, right knee: Secondary | ICD-10-CM | POA: Diagnosis not present

## 2017-02-20 DIAGNOSIS — G8929 Other chronic pain: Secondary | ICD-10-CM | POA: Diagnosis not present

## 2017-02-20 DIAGNOSIS — M5136 Other intervertebral disc degeneration, lumbar region: Secondary | ICD-10-CM

## 2017-02-20 DIAGNOSIS — M25512 Pain in left shoulder: Secondary | ICD-10-CM

## 2017-02-20 DIAGNOSIS — M1611 Unilateral primary osteoarthritis, right hip: Secondary | ICD-10-CM | POA: Diagnosis not present

## 2017-02-20 DIAGNOSIS — E790 Hyperuricemia without signs of inflammatory arthritis and tophaceous disease: Secondary | ICD-10-CM | POA: Diagnosis not present

## 2017-02-20 DIAGNOSIS — M51369 Other intervertebral disc degeneration, lumbar region without mention of lumbar back pain or lower extremity pain: Secondary | ICD-10-CM

## 2017-02-20 DIAGNOSIS — M19041 Primary osteoarthritis, right hand: Secondary | ICD-10-CM

## 2017-02-20 NOTE — Progress Notes (Signed)
Office Visit Note  Patient: Rodney Walsh             Date of Birth: 12-26-1950           MRN: [id number]             PCP: [name], PA-C Referring: [name], PA-C Visit Date: 02/20/2017 Occupation: @GUAROCC @    Subjective:  No chief complaint on file.   History of Present Illness: Rodney Walsh is a 66 y.o. male  Who was last seen on 07/28/2016 for osteoarthritis, trochanteric bursitis, left shoulder joint pain.  In addition, patient also has a history of sleep apnea, prostate cancer, diabetes mellitus, hypertension, and possible gout (had uric acid mildly elevated at 1 time with a history of gout flare once a year which responds well to colchicine (allopurinol was offered but declined) and personal history of adenomatous colonic polyps.  On the last visit, since his left shoulder was bothering him, Dr. Estanislado Pandy gave him a left shoulder joint cortisone injection and he tolerated the procedure well.  He also gets right hip injection for Dr. Romona Curls office using a fluoroscope. He states that injection only work for him for 2-3 days and then the pain came back.  Today, he is having his usual pain and discomfort and he is planning on starting to do water aerobics and weight loss activities.  He is tolerating those pains well and does not need to change any prescriptions at this time He has not had any gout flare. His left great toe is side that bothers him. He uses colchicine one pill daily 2 days and that has resulted in the past. He has not had a flare of gout since the last visit. He declines using allopurinol. He watches his diet and those foods to eat and to avoid to minimize risk of flare of gout.  Activities of Daily Living:  Patient reports morning stiffness for 1 hour.   Patient Reports nocturnal pain.  Difficulty dressing/grooming: Reports Difficulty climbing stairs: Reports Difficulty getting out of chair: Reports Difficulty using hands for taps,  buttons, cutlery, and/or writing: Reports   Review of Systems  Constitutional: Negative for fatigue.  HENT: Negative for mouth sores and mouth dryness.   Eyes: Negative for dryness.  Respiratory: Negative for shortness of breath.   Gastrointestinal: Negative for constipation and diarrhea.  Musculoskeletal: Negative for myalgias and myalgias.  Skin: Negative for sensitivity to sunlight.  Neurological: Negative for memory loss.  Psychiatric/Behavioral: Negative for sleep disturbance.    PMFS History:  Patient Active Problem List   Diagnosis Date Noted  . Polyarthralgia 09/21/2016  . Trochanteric bursitis of right hip 09/21/2016  . Primary osteoarthritis of both hands 09/21/2016  . Rheumatoid factor positive 09/21/2016  . Primary osteoarthritis of right hip 06/02/2016  . Primary osteoarthritis of right knee 06/02/2016  . Urinary frequency 03/15/2016  . Pain in joint, shoulder region 03/15/2016  . Degenerative disc disease, lumbar 10/13/2015  . 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 Diabetic retinopathy ([place]) 10/04/2015  . Nocturia 06/10/2014  . Cataract 09/25/2012  . OSA (obstructive sleep apnea) 08/10/2012  . Eczema 08/10/2012  . Vitamin D deficiency 08/10/2012  . Erectile dysfunction 08/10/2012  . Personal history of adenomatous colonic polyps 04/08/2012  . History of prostate cancer 01/04/2012  . Diabetes type 2, controlled ([place]) 10/25/2011  . HTN (hypertension) 10/25/2011  . Hyperlipidemia LDL goal <70 10/25/2011  . Morbid obesity ([place]) 10/25/2011    Past  Medical History:  Diagnosis Date  . Arthritis    left shoulder and left knee  . Diabetes mellitus without complication ([place])   . Hyperlipidemia   . Hypertension   . Nephrolithiasis   . Prostate cancer [facility])     Family History  Problem Relation Age of Onset  . Cancer Mother        ovarian  . Alzheimer's disease Father   . Hypertension Sister   . Diabetes Brother   . Renal Disease  Brother   . Heart disease Brother   . Diabetes Daughter   . Seizures Daughter   . Cancer Sister        ?uterine  . Hypertension Sister   . Diabetes Sister   . Hyperlipidemia Sister   . Hypertension Sister   . Alcohol abuse Son   . Drug abuse Son        heroin, prescription opiates  . Colon cancer Neg Hx    Past Surgical History:  Procedure Laterality Date  . BACK SURGERY    . COLONOSCOPY N/A 07/17/2012   Procedure: COLONOSCOPY;  Surgeon: Gatha Mayer, MD;  Location: [facility];  Service: Endoscopy;  Laterality: N/A;  . PROSTATE SURGERY     with seed placement   Social History   Social History Narrative   Lives with his wife and their youngest son Paramedic. Has four sons and one daughter. Has 9 grandchildren.      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    Drinks 2 cups of coffee a day      Objective: Vital Signs: There were no vitals taken for this visit.   Physical Exam  Constitutional: He is oriented to person, place, and time. He appears well-developed and well-nourished.  HENT:  Head: Normocephalic and atraumatic.  Eyes: Pupils are equal, round, and reactive to light. Conjunctivae and EOM are normal.  Neck: Normal range of motion. Neck supple.  Cardiovascular: Normal rate, regular rhythm and normal heart sounds.  Exam reveals no gallop and no friction rub.   No murmur heard. Pulmonary/Chest: Effort normal and breath sounds normal. No respiratory distress. He has no wheezes. He has no rales. He exhibits no tenderness.  Abdominal: Soft. He exhibits no distension and no mass. There is no tenderness. There is no guarding.  Musculoskeletal: Normal range of motion.  Lymphadenopathy:    He has no cervical adenopathy.  Neurological: He is alert and oriented to person, place, and time. He exhibits normal muscle tone. Coordination normal.  Skin: Skin is warm and dry. Capillary refill takes less than 2 seconds. No rash noted.  Psychiatric: He has a normal mood and affect. His behavior  is normal. Judgment and thought content normal.  Vitals reviewed.    Musculoskeletal Exam:  Decreased range of motion of left shoulder joint. Unable to put his hands behind his back bilaterally. Grip strength is equal and strong bilaterally Fiber also tender points are absent  CDAI Exam: No CDAI exam completed.  No synovitis  Investigation: No additional findings. Office Visit on 02/17/2017  Component Date Value Ref Range Status  . WBC 02/17/2017 3.9  3.4 - 10.8 x10E3/uL Final  . RBC 02/17/2017 4.38  4.14 - 5.80 x10E6/uL Final  . Hemoglobin 02/17/2017 13.3  13.0 - 17.7 g/dL Final  . Hematocrit 02/17/2017 40.1  37.5 - 51.0 % Final  . MCV 02/17/2017 92  79 - 97 fL Final  . MCH 02/17/2017 30.4  26.6 - 33.0 pg Final  . MCHC 02/17/2017 33.2  31.5 - 35.7 g/dL Final  . RDW 02/17/2017 13.4  12.3 - 15.4 % Final  . Platelets 02/17/2017 245  150 - 379 x10E3/uL Final  . Neutrophils 02/17/2017 61  Not Estab. % Final  . Lymphs 02/17/2017 25  Not Estab. % Final  . Monocytes 02/17/2017 10  Not Estab. % Final  . Eos 02/17/2017 3  Not Estab. % Final  . Basos 02/17/2017 1  Not Estab. % Final  . Neutrophils Absolute 02/17/2017 2.4  1.4 - 7.0 x10E3/uL Final  . Lymphocytes Absolute 02/17/2017 1.0  0.7 - 3.1 x10E3/uL Final  . Monocytes Absolute 02/17/2017 0.4  0.1 - 0.9 x10E3/uL Final  . EOS (ABSOLUTE) 02/17/2017 0.1  0.0 - 0.4 x10E3/uL Final  . Basophils Absolute 02/17/2017 0.0  0.0 - 0.2 x10E3/uL Final  . Immature Granulocytes 02/17/2017 0  Not Estab. % Final  . Immature Grans (Abs) 02/17/2017 0.0  0.0 - 0.1 x10E3/uL Final  . Glucose 02/17/2017 125* 65 - 99 mg/dL Final  . BUN 02/17/2017 14  8 - 27 mg/dL Final  . Creatinine, Ser 02/17/2017 1.04  0.76 - 1.27 mg/dL Final  . GFR calc non Af Amer 02/17/2017 74  >59 mL/min/1.73 Final  . GFR calc Af Amer 02/17/2017 86  >59 mL/min/1.73 Final  . BUN/Creatinine Ratio 02/17/2017 13  10 - 24 Final  . Sodium 02/17/2017 142  134 - 144 mmol/L Final  .  Potassium 02/17/2017 4.7  3.5 - 5.2 mmol/L Final  . Chloride 02/17/2017 102  96 - 106 mmol/L Final  . CO2 02/17/2017 24  20 - 29 mmol/L Final  . Calcium 02/17/2017 9.5  8.6 - 10.2 mg/dL Final  . Total Protein 02/17/2017 6.8  6.0 - 8.5 g/dL Final  . Albumin 02/17/2017 4.1  3.6 - 4.8 g/dL Final  . Globulin, Total 02/17/2017 2.7  1.5 - 4.5 g/dL Final  . Albumin/Globulin Ratio 02/17/2017 1.5  1.2 - 2.2 Final  . Bilirubin Total 02/17/2017 0.3  0.0 - 1.2 mg/dL Final  . Alkaline Phosphatase 02/17/2017 74  39 - 117 IU/L Final  . AST 02/17/2017 14  0 - 40 IU/L Final  . ALT 02/17/2017 9  0 - 44 IU/L Final  . Hgb A1c MFr Bld 02/17/2017 6.0* 4.8 - 5.6 % Final   Comment:          Prediabetes: 5.7 - 6.4          Diabetes: >6.4          Glycemic control for adults with diabetes: <7.0   . Est. average glucose Bld gHb Est-m* 02/17/2017 126  mg/dL Final  . Cholesterol, Total 02/17/2017 164  100 - 199 mg/dL Final  . Triglycerides 02/17/2017 165* 0 - 149 mg/dL Final  . HDL 02/17/2017 42  >39 mg/dL Final   Comment: **Effective February 27, 2017, HDL Cholesterol**   reference interval will be changing to:                                   Male        Male                               43 - [phone]   59 - [phone]   . VLDL Cholesterol Cal 02/17/2017 33  5 - 40 mg/dL Final  . LDL Calculated 02/17/2017 89  0 -  99 mg/dL Final  . Chol/HDL Ratio 02/17/2017 3.9  0.0 - 5.0 ratio Final   Comment:                                   T. Chol/HDL Ratio                                             Men  Women                               1/2 Avg.Risk  3.4    3.3                                   Avg.Risk  5.0    4.4                                2X Avg.Risk  9.6    7.1                                3X Avg.Risk 23.4   11.0   . Vit D, 25-Hydroxy 02/17/2017 38.8  30.0 - 100.0 ng/mL Final   Comment: Vitamin D deficiency has been defined by the [place] practice guideline as a level  of serum 25-OH vitamin D less than 20 ng/mL (1,2). The Endocrine Society went on to further define vitamin D insufficiency as a level between 21 and 29 ng/mL (2). 1. IOM (Institute of Medicine). 2010. Dietary reference    intakes for calcium and D. [place]: The    [employer]. 2. Holick MF, Binkley [place], Bischoff-Ferrari HA, et al.    Evaluation, treatment, and prevention of vitamin D    deficiency: an Endocrine Society clinical practice    guideline. JCEM. 2011 Jul; 96(7):1911-30.   Abstract on 12/22/2016  Component Date Value Ref Range Status  . HM Diabetic Eye Exam 11/23/2016 Retinopathy* No Retinopathy Final  Office Visit on 11/22/2016  Component Date Value Ref Range Status  . Color, UA 11/22/2016 yellow  yellow Final  . Clarity, UA 11/22/2016 clear  clear Final  . Glucose, UA 11/22/2016 negative  negative mg/dL Final  . Bilirubin, UA 11/22/2016 negative  negative Final  . Ketones, POC UA 11/22/2016 negative  negative mg/dL Final  . Spec Grav, UA 11/22/2016 1.010  1.010 - 1.025 Final  . Blood, UA 11/22/2016 negative  negative Final  . pH, UA 11/22/2016 7.0  5.0 - 8.0 Final  . Protein Ur, POC 11/22/2016 negative  negative mg/dL Final  . Urobilinogen, UA 11/22/2016 0.2  0.2 or 1.0 E.U./dL Final  . Nitrite, UA 11/22/2016 Negative  Negative Final  . Leukocytes, UA 11/22/2016 Negative  Negative Final  . Urine Culture, Routine 11/22/2016 Final report   Final  . Organism ID, Bacteria 11/22/2016 Comment   Final   Comment: Mixed urogenital flora Less than 10,000 colonies/mL   . WBC 11/22/2016 4.4  3.4 - 10.8 x10E3/uL Final  . RBC 11/22/2016 4.34  4.14 - 5.80 x10E6/uL Final  . Hemoglobin 11/22/2016  13.2  13.0 - 17.7 g/dL Final  . Hematocrit 11/22/2016 39.5  37.5 - 51.0 % Final  . MCV 11/22/2016 91  79 - 97 fL Final  . MCH 11/22/2016 30.4  26.6 - 33.0 pg Final  . MCHC 11/22/2016 33.4  31.5 - 35.7 g/dL Final  . RDW 11/22/2016 13.3  12.3 - 15.4 % Final  . Platelets  11/22/2016 272  150 - 379 x10E3/uL Final  . Neutrophils 11/22/2016 59  Not Estab. % Final  . Lymphs 11/22/2016 26  Not Estab. % Final  . Monocytes 11/22/2016 12  Not Estab. % Final  . Eos 11/22/2016 2  Not Estab. % Final  . Basos 11/22/2016 1  Not Estab. % Final  . Neutrophils Absolute 11/22/2016 2.7  1.4 - 7.0 x10E3/uL Final  . Lymphocytes Absolute 11/22/2016 1.1  0.7 - 3.1 x10E3/uL Final  . Monocytes Absolute 11/22/2016 0.5  0.1 - 0.9 x10E3/uL Final  . EOS (ABSOLUTE) 11/22/2016 0.1  0.0 - 0.4 x10E3/uL Final  . Basophils Absolute 11/22/2016 0.0  0.0 - 0.2 x10E3/uL Final  . Immature Granulocytes 11/22/2016 0  Not Estab. % Final  . Immature Grans (Abs) 11/22/2016 0.0  0.0 - 0.1 x10E3/uL Final  . Glucose 11/22/2016 107* 65 - 99 mg/dL Final  . BUN 11/22/2016 11  8 - 27 mg/dL Final  . Creatinine, Ser 11/22/2016 1.07  0.76 - 1.27 mg/dL Final  . GFR calc non Af Amer 11/22/2016 72  >59 mL/min/1.73 Final  . GFR calc Af Amer 11/22/2016 83  >59 mL/min/1.73 Final  . BUN/Creatinine Ratio 11/22/2016 10  10 - 24 Final  . Sodium 11/22/2016 138  134 - 144 mmol/L Final  . Potassium 11/22/2016 5.0  3.5 - 5.2 mmol/L Final  . Chloride 11/22/2016 99  96 - 106 mmol/L Final  . CO2 11/22/2016 23  20 - 29 mmol/L Final  . Calcium 11/22/2016 10.3* 8.6 - 10.2 mg/dL Final  Office Visit on 11/15/2016  Component Date Value Ref Range Status  . WBC 11/15/2016 4.3  3.4 - 10.8 x10E3/uL Final  . RBC 11/15/2016 4.25  4.14 - 5.80 x10E6/uL Final  . Hemoglobin 11/15/2016 12.8* 13.0 - 17.7 g/dL Final  . Hematocrit 11/15/2016 38.8  37.5 - 51.0 % Final  . MCV 11/15/2016 91  79 - 97 fL Final  . MCH 11/15/2016 30.1  26.6 - 33.0 pg Final  . MCHC 11/15/2016 33.0  31.5 - 35.7 g/dL Final  . RDW 11/15/2016 13.8  12.3 - 15.4 % Final  . Platelets 11/15/2016 250  150 - 379 x10E3/uL Final  . Neutrophils 11/15/2016 61  Not Estab. % Final  . Lymphs 11/15/2016 24  Not Estab. % Final  . Monocytes 11/15/2016 12  Not Estab. % Final  .  Eos 11/15/2016 2  Not Estab. % Final  . Basos 11/15/2016 1  Not Estab. % Final  . Neutrophils Absolute 11/15/2016 2.6  1.4 - 7.0 x10E3/uL Final  . Lymphocytes Absolute 11/15/2016 1.0  0.7 - 3.1 x10E3/uL Final  . Monocytes Absolute 11/15/2016 0.5  0.1 - 0.9 x10E3/uL Final  . EOS (ABSOLUTE) 11/15/2016 0.1  0.0 - 0.4 x10E3/uL Final  . Basophils Absolute 11/15/2016 0.0  0.0 - 0.2 x10E3/uL Final  . Immature Granulocytes 11/15/2016 0  Not Estab. % Final  . Immature Grans (Abs) 11/15/2016 0.0  0.0 - 0.1 x10E3/uL Final  . Glucose 11/15/2016 137* 65 - 99 mg/dL Final  . BUN 11/15/2016 13  8 - 27 mg/dL Final  . Creatinine,  Ser 11/15/2016 0.89  0.76 - 1.27 mg/dL Final  . GFR calc non Af Amer 11/15/2016 89  >59 mL/min/1.73 Final  . GFR calc Af Amer 11/15/2016 103  >59 mL/min/1.73 Final  . BUN/Creatinine Ratio 11/15/2016 15  10 - 24 Final  . Sodium 11/15/2016 137  134 - 144 mmol/L Final  . Potassium 11/15/2016 4.9  3.5 - 5.2 mmol/L Final  . Chloride 11/15/2016 101  96 - 106 mmol/L Final  . CO2 11/15/2016 20  20 - 29 mmol/L Final  . Calcium 11/15/2016 9.5  8.6 - 10.2 mg/dL Final  . Total Protein 11/15/2016 6.9  6.0 - 8.5 g/dL Final  . Albumin 11/15/2016 4.2  3.6 - 4.8 g/dL Final  . Globulin, Total 11/15/2016 2.7  1.5 - 4.5 g/dL Final  . Albumin/Globulin Ratio 11/15/2016 1.6  1.2 - 2.2 Final  . Bilirubin Total 11/15/2016 <0.2  0.0 - 1.2 mg/dL Final  . Alkaline Phosphatase 11/15/2016 68  39 - 117 IU/L Final  . AST 11/15/2016 16  0 - 40 IU/L Final  . ALT 11/15/2016 11  0 - 44 IU/L Final  . Cholesterol, Total 11/15/2016 150  100 - 199 mg/dL Final  . Triglycerides 11/15/2016 123  0 - 149 mg/dL Final  . HDL 11/15/2016 43  >39 mg/dL Final  . VLDL Cholesterol Cal 11/15/2016 25  5 - 40 mg/dL Final  . LDL Calculated 11/15/2016 82  0 - 99 mg/dL Final  . Chol/HDL Ratio 11/15/2016 3.5  0.0 - 5.0 ratio Final   Comment:                                   T. Chol/HDL Ratio                                              Men  Women                               1/2 Avg.Risk  3.4    3.3                                   Avg.Risk  5.0    4.4                                2X Avg.Risk  9.6    7.1                                3X Avg.Risk 23.4   11.0   . Hgb A1c MFr Bld 11/15/2016 6.4* 4.8 - 5.6 % Final   Comment:          Pre-diabetes: 5.7 - 6.4          Diabetes: >6.4          Glycemic control for adults with diabetes: <7.0   . Est. average glucose Bld gHb Est-m* 11/15/2016 137  mg/dL Final  . Albumin, Urine 11/15/2016 8.4  Not Estab. ug/mL Final     Imaging: No results found.  History of left third finger with bulging PULP between PIP and DIP joint Patient states that he broke that finger when he was young (in fourth grade) and never sought medical attention. It has healed well but has been looking like this since then. He does not want anything done to it. I took these pictures as baseline in case there is an issue in the future and we wanted to know what it looked like when there was no problem.   Speciality Comments: No specialty comments available.    Procedures:  No procedures performed Allergies: Patient has no known allergies.   Assessment / Plan:     Visit Diagnoses: Rheumatoid factor positive  Chronic left shoulder pain  Primary osteoarthritis of right hip  Primary osteoarthritis of right knee  Primary osteoarthritis of both hands  Degenerative disc disease, lumbar    Plan: #1: History of positive rheumatoid factor (23); anti-CCP negative; previous ultrasound negative for synovitis  #2: OA of right hip  #3: History of right hip trochanteric bursitis and has responded well in the past to cortisone injection  #4: Primary OA of right knee Weight loss advised in the past as well as proper exercise  #5: OA of both hands  #6: History of possible gout. History of uric acid mildly elevated which responds well to colchicine in the past. In the past, allopurinol  was offered but patient declined  #7: Labs recently done in October (CBC with differential and CMP with GFR) and all were within normal limits. Since labs are done, I do not want to do uric acid at today's visit. However, it would be prudent to do 1 in the next 6 months when blood is drawn once again.  #8: Return to clinic in 5 months  Orders: No orders of the defined types were placed in this encounter.  No orders of the defined types were placed in this encounter.   Face-to-face time spent with patient was 30 minutes. 50% of time was spent in counseling and coordination of care.  Follow-Up Instructions: No Follow-up on file.   Eliezer Lofts, PA-C  Note - This record has been created using [employer].  Chart creation errors have been sought, but may not always  have been located. Such creation errors do not reflect on  the standard of medical care.

## 2017-02-23 ENCOUNTER — Encounter: Payer: Self-pay | Admitting: Physician Assistant

## 2017-02-23 DIAGNOSIS — E559 Vitamin D deficiency, unspecified: Secondary | ICD-10-CM

## 2017-02-23 DIAGNOSIS — I10 Essential (primary) hypertension: Secondary | ICD-10-CM

## 2017-02-24 ENCOUNTER — Other Ambulatory Visit: Payer: Self-pay | Admitting: Physician Assistant

## 2017-02-24 DIAGNOSIS — E559 Vitamin D deficiency, unspecified: Secondary | ICD-10-CM

## 2017-02-24 DIAGNOSIS — I10 Essential (primary) hypertension: Secondary | ICD-10-CM

## 2017-02-24 MED ORDER — LISINOPRIL 20 MG PO TABS: 20.0000 mg | ORAL_TABLET | Freq: Every day | ORAL | 3 refills | Status: AC

## 2017-02-24 MED ORDER — VITAMIN D (ERGOCALCIFEROL) 1.25 MG (50000 UNIT) PO CAPS: 50000.0000 [IU] | ORAL_CAPSULE | ORAL | 3 refills | Status: DC

## 2017-02-26 ENCOUNTER — Encounter: Payer: Self-pay | Admitting: Neurology

## 2017-02-28 ENCOUNTER — Ambulatory Visit (INDEPENDENT_AMBULATORY_CARE_PROVIDER_SITE_OTHER): Payer: Medicare Other | Admitting: Neurology

## 2017-02-28 ENCOUNTER — Encounter: Payer: Self-pay | Admitting: Neurology

## 2017-02-28 VITALS — BP 180/93 | HR 82 | Ht 64.0 in | Wt 274.0 lb

## 2017-02-28 DIAGNOSIS — Z9989 Dependence on other enabling machines and devices: Secondary | ICD-10-CM

## 2017-02-28 DIAGNOSIS — G4733 Obstructive sleep apnea (adult) (pediatric): Secondary | ICD-10-CM | POA: Diagnosis not present

## 2017-02-28 NOTE — Patient Instructions (Signed)
Please continue using your CPAP regularly. While your insurance requires that you use CPAP at least 4 hours each night on 70% of the nights, I recommend, that you not skip any nights and use it throughout the night if you can. Getting used to CPAP and staying with the treatment long term does take time and patience and discipline. Untreated obstructive sleep apnea when it is moderate to severe can have an adverse impact on cardiovascular health and raise her risk for heart disease, arrhythmias, hypertension, congestive heart failure, stroke and diabetes. Untreated obstructive sleep apnea causes sleep disruption, nonrestorative sleep, and sleep deprivation. This can have an impact on your day to day functioning and cause daytime sleepiness and impairment of cognitive function, memory loss, mood disturbance, and problems focussing. Using CPAP regularly can improve these symptoms.  Keep up the good work! We can see you in 6 months, you can see one of our nurse practitioners. I will see you after that, hopefully yearly.

## 2017-02-28 NOTE — Progress Notes (Signed)
Subjective:    Patient ID: Rodney Walsh is a 66 y.o. male.  HPI     Interim history:   Rodney Walsh is a 66 year old right-handed gentleman with an underlying medical history of joint pain, degenerative disc disease, diabetes with retinopathy, nocturia, urinary frequency, cataracts, eczema, vitamin D deficiency, ED, prostate cancer, hypertension, hyperlipidemia, and morbid obesity who returns for follow-up consultation of his obstructive sleep apnea, after sleep study testing and recently starting CPAP therapy at home. The patient is unaccompanied today. I first met him on 06/09/2016 at the request of his primary care provider, at which time he reported a prior diagnosis of OSA. He had not used CPAP in about 5 years. He needed re-evaluation and a new machine. He had a baseline sleep study, followed by a CPAP titration study. I went over his test results with him in detail today. Baseline sleep study from 06/19/2016 showed a sleep latency delayed at 83.5 minutes and REM latency was prolonged at 207 minutes, sleep efficiency reduced at 69.4%. He had absence of slow-wave sleep and REM sleep was 16.8, he had increase in stage II sleep. Total AHI was 16.4 per hour, REM AHI 47 per hour, supine AHI 16.1 per hour, average oxygen saturation 95%, nadir was 72%. He had no significant PLMS. He was advised to return for a CPAP titration study. He had this on 07/07/2016, sleep efficiency was 87.2%, sleep latency 9 minutes, REM latency 215 minutes. He was fitted with a medium fullface mask and titrated from 5 cm to 15 cm. On the final pressure his AHI was 2.9 per hour, supine REM sleep was achieved, O2 nadir of 91%. Based on his test results I prescribed CPAP therapy for home use. He was unable to get his CPAP machine until recently due to financial reasons.  Today, 02/28/2017: I reviewed his CPAP compliance data from 01/28/2017 through 02/26/2017 which is a total of 30 days, during which time he used his machine 22  days with percent used days greater than 4 hours at 36.7%, indicating low compliance with an average usage for days on treatment of only 4 hours and 2 minutes, residual AHI at goal at 2.8 per hour, leak at times high, pressure at 15 cm. Of note, in the month of August through September, 12/14/2016 through 01/12/2017 he had a compliance percentage of 100% for greater than 4 hours usage, averaging 7 hours and 10 min. Leak was high at the time. AHI 2.3 per hour on a pressure of 15 cm. He reports doing okay with his new CPAP, he believes that his humidifier may be broken and he will make an appointment with his DME provider to have the machine looked at. He started on 12/14/16. Felt well in the beginning, but now mouth is too dry and CPAP is difficult to tolerate. He was using purified water, not distilled water. He felt great the first. Stopped meds for Hip pain. Takes aleve OTC. He fulfilled criteria for compliance criteria for CPAP usage in the first month of his CPAP start. He also noticed significant improvement in his sleep quality and daytime tiredness. He is motivated to continue to use CPAP regularly.   The patient's allergies, current medications, family history, past medical history, past social history, past surgical history and problem list were reviewed and updated as appropriate.   Previously (copied from previous notes for reference):   06/09/2016:  (He) was previously diagnosed with obstructive sleep apnea several years ago and place on CPAP therapy. Prior  sleep study results are not available for my review today. A CPAP compliance download is not available for my review today. I reviewed your office note from 03/15/2016. He was recently seen by rheumatology on 06/02/2016. His Epworth sleepiness score is 7 out of 24 today, his fatigue score is 39 out of 63. He reports that he had a sleep study about 15 years ago. He is retired. He lives with his wife. He has 5 grown children. He quit smoking in  2008 and does not currently drink any alcohol. He drinks 2 cups of coffee per day.  He had a recent gout flare, currently on colchicine. Had recent hip injection and it may have helped. Has had prior injections to L knee and R knee. All five kids are in [place], 9 GC. Retired Recruitment consultant, city bus and school bus. Wife cannot sleep in the same BR, d/t snoring and apneas. He has frequent nocturia, about 3-4 per night. No AM HAs. No Sx of neuropathy.  He has not used his CPAP for about 5 years, needs supplies and is a mouth breather. He had a ResMed Ultra Mirage FFM L, and a chinstrap. Has an old RedMed Compact CPAP machine. His bedtime is between 11 PM and midnight, wakeup time varies.   His Past Medical History Is Significant For: Past Medical History:  Diagnosis Date  . Arthritis    left shoulder and left knee  . Diabetes mellitus without complication ([place])   . Hyperlipidemia   . Hypertension   . Nephrolithiasis   . Prostate cancer [facility])     His Past Surgical History Is Significant For: Past Surgical History:  Procedure Laterality Date  . BACK SURGERY    . COLONOSCOPY N/A 07/17/2012   Procedure: COLONOSCOPY;  Surgeon: Gatha Mayer, MD;  Location: [facility];  Service: Endoscopy;  Laterality: N/A;  . PROSTATE SURGERY     with seed placement    His Family History Is Significant For: Family History  Problem Relation Age of Onset  . Cancer Mother        ovarian  . Alzheimer's disease Father   . Hypertension Sister   . Diabetes Brother   . Renal Disease Brother   . Heart disease Brother   . Diabetes Daughter   . Seizures Daughter   . Cancer Sister        ?uterine  . Hypertension Sister   . Diabetes Sister   . Hyperlipidemia Sister   . Hypertension Sister   . Alcohol abuse Son   . Drug abuse Son        heroin, prescription opiates  . Colon cancer Neg Hx     His Social History Is Significant For: Social History   Social History  . Marital status: Married    Spouse name:  Deb  . Number of children: 5  . Years of education: 12   Occupational History  . [place]    Retired    Social History Main Topics  . Smoking status: Former Smoker    Packs/day: 1.00    Years: 45.00    Types: Cigarettes  . Smokeless tobacco: Never Used     Comment: Quit in 2008  . Alcohol use No  . Drug use: No  . Sexual activity: Yes    Partners: Female   Other Topics Concern  . None   Social History Narrative   Lives with his wife and their youngest son Paramedic. Has four sons and  one daughter. Has 9 grandchildren.      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    Drinks 2 cups of coffee a day     His Allergies Are:  No Known Allergies:   His Current Medications Are:  Outpatient Encounter Prescriptions as of 02/28/2017  Medication Sig  . aspirin 81 MG tablet Take 81 mg by mouth daily.  . Blood Glucose Monitoring Suppl (ONE TOUCH ULTRA SYSTEM KIT) w/Device KIT Use to test blood sugar daily.  Marland Kitchen lisinopril (PRINIVIL,ZESTRIL) 20 MG tablet Take 1 tablet (20 mg total) by mouth daily.  . metFORMIN (GLUCOPHAGE) 1000 MG tablet TAKE ONE TABLET BY MOUTH TWICE DAILY WITH A MEAL  . Misc. Devices (ROLLATOR) MISC Use with ambulation.  Marland Kitchen oxybutynin (DITROPAN) 5 MG tablet Take 1 tablet (5 mg total) by mouth 4 (four) times daily.  . [DISCONTINUED] clobetasol ointment (TEMOVATE) 4.08 % Apply 1 application topically 2 (two) times daily. To the lesion on the LEG. (Patient not taking: Reported on 02/20/2017)  . [DISCONTINUED] colchicine 0.6 MG tablet Take 1 tablet (0.6 mg total) by mouth 2 (two) times daily. (Patient not taking: Reported on 02/20/2017)  . [DISCONTINUED] cyclobenzaprine (FLEXERIL) 10 MG tablet TAKE 1 TABLET BY MOUTH THREE TIMES DAILY AS NEEDED FOR MUSCLE SPASMS (Patient not taking: Reported on 02/17/2017)  . [DISCONTINUED] gabapentin (NEURONTIN) 600 MG tablet Take 300 mg by mouth 4 (four) times daily.  . [DISCONTINUED] glucosamine-chondroitin 500-400 MG tablet Take 1  tablet by mouth 3 (three) times daily.  . [DISCONTINUED] HYDROcodone-acetaminophen (NORCO) 7.5-325 MG tablet Take 1 tablet by mouth every 6 (six) hours as needed. (Patient not taking: Reported on 02/17/2017)  . [DISCONTINUED] meloxicam (MOBIC) 15 MG tablet TAKE 1 TABLET BY MOUTH ONCE DAILY (Patient not taking: Reported on 02/20/2017)  . [DISCONTINUED] naproxen sodium (ANAPROX) 220 MG tablet Take 220 mg by mouth 2 (two) times daily with a meal.  . [DISCONTINUED] Vitamin D, Ergocalciferol, (DRISDOL) 50000 units CAPS capsule Take 1 capsule (50,000 Units total) by mouth once a week.   No facility-administered encounter medications on file as of 02/28/2017.   :  Review of Systems:  Out of a complete 14 point review of systems, all are reviewed and negative with the exception of these symptoms as listed below: Review of Systems  Neurological:       Pt presents today to discuss his cpap. Pt feels that his humidifier may be broken and has made an appt with [facility] to get it checked out.    Objective:  Neurological Exam  Physical Exam Physical Examination:   Vitals:   02/28/17 1136  BP: (!) 180/93  Pulse: 82    General Examination: The patient is a very pleasant 66 y.o. male in no acute distress. He appears well-developed and well-nourished and adequately groomed. Good spirits.   HEENT: Normocephalic, atraumatic, pupils are equal, round and reactive to light and accommodation. Corrective eye glasses. Extraocular tracking is good without limitation to gaze excursion or nystagmus noted. Normal smooth pursuit is noted. Hearing is grossly intact. Face is symmetric with normal facial animation and normal facial sensation. Speech is clear with no dysarthria noted. There is no hypophonia. There is no lip, neck/head, jaw or voice tremor. Neck is supple with full range of passive and active motion. There are no carotid bruits on auscultation. Oropharynx exam reveals: moderate mouth dryness, poor  dental hygiene and dentures on top and missing teeth on bottom. He has moderate to significant airway crowding, due to tonsils in  place, larger tongue, large uvula and thicker soft palate, Mallampati is class III. Neck circumference is 21 inches. Tongue protrudes centrally and palate elevates symmetrically.  Chest: Clear to auscultation without wheezing, rhonchi or crackles noted.  Heart: S1+S2+0, regular and normal without murmurs, rubs or gallops noted.   Abdomen: Soft, non-tender and non-distended with normal bowel sounds appreciated on auscultation.  Extremities: There is trace pitting edema in the distal lower extremities bilaterally. Pedal pulses are intact.  Skin: Warm and dry without trophic changes noted. There are no varicose veins.  Musculoskeletal: exam reveals no obvious joint deformities, tenderness or joint swelling or erythema.   Neurologically:  Mental status: The patient is awake, alert and oriented in all 4 spheres. His immediate and remote memory, attention, language skills and fund of knowledge are appropriate. There is no evidence of aphasia, agnosia, apraxia or anomia. Speech is clear with normal prosody and enunciation. Thought process is linear. Mood is normal and affect is normal.  Cranial nerves II - XII are as described above under HEENT exam. In addition: shoulder shrug is normal with equal shoulder height noted. Motor exam: Normal bulk, strength and tone is noted. There is no drift, tremor or rebound. Romberg is not tested for safety. Reflexes are 1+ throughout. Fine motor skills and coordination: grossly intact.  Cerebellar testing: No dysmetria or intention tremor on finger to nose testing.   Sensory exam: intact to light touch.  Gait, station and balance: He stands with difficulty, has to push himself up, walks with a limp and walks slowly and cautiously, has a walker today. Tandem walk is not possible.               Assessment and Plan:  In summary,  Rodney Walsh is a very pleasant 66 year old male with an underlying medical history of joint pain, degenerative disc disease, diabetes with retinopathy, nocturia, urinary frequency, cataracts, eczema, vitamin D deficiency, ED, prostate cancer, hypertension, hyperlipidemia, and morbid obesity who presents for follow up consultation of his moderate to severe obstructive sleep apnea after recent sleep study testing this year. He had a prior Dx from several years ago, but was no longer on CPAP for about 5 years. He has We started CPAP therapy in early August and has demonstrated full compliance in the first month of his treatment. He then developed difficulty with CPAP usage because of the humidifier not working properly. He is going to make an appointment with his DME provider to troubleshoot this issue and is motivated to go back on treatment fully. He is commended for his treatment adherence and advised to follow-up in about 6 months with one of our nurse practitioners routinely. We talked about his sleep study results from February and March 2018 as well as his compliance data today.  I answered all his questions today and the patient was in agreement.  I spent 25 minutes in total face-to-face time with the patient, more than 50% of which was spent in counseling and coordination of care, reviewing test results, reviewing medication and discussing or reviewing the diagnosis of OSA, its prognosis and treatment options. Pertinent laboratory and imaging test results that were available during this visit with the patient were reviewed by me and considered in my medical decision making (see chart for details).

## 2017-03-03 ENCOUNTER — Ambulatory Visit: Payer: Self-pay | Admitting: Nurse Practitioner

## 2017-03-14 ENCOUNTER — Telehealth: Payer: Self-pay | Admitting: Neurology

## 2017-03-14 NOTE — Telephone Encounter (Signed)
  Previously reviewed his compliance data from 12/14/2016 through 01/12/2017, during which time patient was compliant with treatment on his CPAP of 15 cm. DME request signed office note from 02/20/2017, patient was actually seen on 02/28/2017. Pls fax signed note.

## 2017-03-15 ENCOUNTER — Ambulatory Visit: Payer: Medicare Other | Admitting: Neurology

## 2017-04-03 ENCOUNTER — Ambulatory Visit: Payer: Medicare Other | Admitting: Rheumatology

## 2017-04-05 ENCOUNTER — Ambulatory Visit (INDEPENDENT_AMBULATORY_CARE_PROVIDER_SITE_OTHER): Payer: Medicare Other

## 2017-04-05 VITALS — BP 122/70 | HR 83 | Temp 97.7°F | Ht 64.0 in | Wt 270.0 lb

## 2017-04-05 DIAGNOSIS — Z598 Other problems related to housing and economic circumstances: Secondary | ICD-10-CM

## 2017-04-05 DIAGNOSIS — Z0000 Encounter for general adult medical examination without abnormal findings: Secondary | ICD-10-CM | POA: Diagnosis not present

## 2017-04-05 DIAGNOSIS — Z599 Problem related to housing and economic circumstances, unspecified: Secondary | ICD-10-CM

## 2017-04-05 NOTE — Progress Notes (Signed)
Subjective:   Rodney Walsh is a 66 y.o. male who presents for an Initial Medicare Annual Wellness Visit.  Review of Systems  N/A Cardiac Risk Factors include: male gender;advanced age (>78mn, >>37women);diabetes mellitus;dyslipidemia;hypertension;obesity (BMI >30kg/m2)    Objective:    Today's Vitals   04/05/17 1524 04/05/17 1538  BP: 122/70   Pulse: 83   Temp: 97.7 F (36.5 C)   TempSrc: Oral   SpO2: 97%   Weight: 270 lb (122.5 kg)   Height: 5' 4" (1.626 m)   PainSc:  8    Body mass index is 46.35 kg/m.  Current Medications (verified) Outpatient Encounter Medications as of 04/05/2017  Medication Sig  . aspirin 81 MG tablet Take 81 mg by mouth daily.  . Blood Glucose Monitoring Suppl (ONE TOUCH ULTRA SYSTEM KIT) w/Device KIT Use to test blood sugar daily.  .Marland Kitchenlisinopril (PRINIVIL,ZESTRIL) 20 MG tablet Take 1 tablet (20 mg total) by mouth daily.  . metFORMIN (GLUCOPHAGE) 1000 MG tablet TAKE ONE TABLET BY MOUTH TWICE DAILY WITH A MEAL  . Misc. Devices (ROLLATOR) MISC Use with ambulation.  . naproxen sodium (ALEVE) 220 MG tablet Take 220 mg by mouth.  . oxybutynin (DITROPAN) 5 MG tablet Take 1 tablet (5 mg total) by mouth 4 (four) times daily.   No facility-administered encounter medications on file as of 04/05/2017.     Allergies (verified) Patient has no known allergies.   History: Past Medical History:  Diagnosis Date  . Arthritis    left shoulder and left knee  . Diabetes mellitus [place] complication ([place]   . Hyperlipidemia   . Hypertension   . Nephrolithiasis   . Prostate cancer ([facility]    Past Surgical History:  Procedure Laterality Date  . BACK SURGERY    . COLONOSCOPY N/A 07/17/2012   Procedure: COLONOSCOPY;  Surgeon: CGatha Mayer MD;  Location: [facility];  Service: Endoscopy;  Laterality: N/A;  . PROSTATE SURGERY     with seed placement   Family History  Problem Relation Age of Onset  . Cancer Mother        ovarian  . Alzheimer's  disease Father   . Hypertension Sister   . Diabetes Brother   . Renal Disease Brother   . Heart disease Brother   . Diabetes Daughter   . Seizures Daughter   . Cancer Sister        ?uterine  . Hypertension Sister   . Diabetes Sister   . Hyperlipidemia Sister   . Hypertension Sister   . Alcohol abuse Son   . Drug abuse Son        heroin, prescription opiates  . Colon cancer Neg Hx    Social History   Occupational History  . Occupation: BUS DRIVER    Employer: [employer] Retired   Tobacco Use  . Smoking status: Former Smoker    Packs/day: 1.00    Years: 45.00    Pack years: 45.00    Types: Cigarettes  . Smokeless tobacco: Never Used  . Tobacco comment: Quit in 2008  Substance and Sexual Activity  . Alcohol use: No  . Drug use: No  . Sexual activity: Yes    Partners: Female   Tobacco Counseling Counseling given: Not Answered Comment: Quit in 2008   Activities of Daily Living In your present state of health, do you have any difficulty performing the following activities: 04/05/2017  Hearing? N  Vision? Y  Comment Patient  has issues with small print  Difficulty concentrating or making decisions? Y  Comment Patient has some memory issues  Walking or climbing stairs? Y  Comment Patient has arthritis in his hip  Dressing or bathing? N  Doing errands, shopping? N  Preparing Food and eating ? N  Using the Toilet? N  In the past six months, have you accidently leaked urine? Y  Comment Patient has issues with urine leakage   Do you have problems with loss of bowel control? N  Managing your Medications? N  Managing your Finances? N  Housekeeping or managing your Housekeeping? N  Some recent data might be hidden    Immunizations and Health Maintenance Immunization History  Administered Date(s) Administered  . Influenza Split 01/31/2012  . Influenza,inj,Quad PF,6+ Mos 03/26/2013, 06/10/2014, 01/06/2015, 02/09/2016, 02/17/2017  .  Pneumococcal Conjugate-13 04/14/2015  . Pneumococcal Polysaccharide-23 11/29/2006, 08/16/2016  . Td 04/08/2005  . Tdap 05/27/2015  . Zoster 08/07/2013   There are no preventive care reminders to display for this patient.  Patient Care Team: [name], PA-C as PCP - General (Physician Assistant) Nat Christen, MD as Attending Physician (Optometry) Bo Merino, MD as Consulting Physician (Rheumatology) Star Age, MD as Attending Physician (Neurology)  Indicate any recent Medical Services you may have received from other than [facility] providers in the past year (date may be approximate).    Assessment:   This is a routine wellness examination for Rodney Walsh.   Hearing/Vision screen Hearing Screening Comments: Patient has not had a hearing exam  Vision Screening Comments: Patient sees his eye doctor once yearly for his routine eye exams.   Dietary issues and exercise activities discussed: Current Exercise Habits: The patient does not participate in regular exercise at present, Exercise limited by: None identified  Goals    . Exercise 3x per week (30 min per time)     Patient wants to try to start back exercising more often.       Depression Screen PHQ 2/9 Scores 04/05/2017 02/17/2017 11/22/2016 11/15/2016  PHQ - 2 Score 0 0 0 0    Fall Risk Fall Risk  04/05/2017 02/17/2017 11/22/2016 11/15/2016 08/16/2016  Falls in the past year? _0     Cognitive Function:     6CIT Screen 04/05/2017  What Year? 0 points  What month? 0 points  What time? 0 points  Count back from 20 0 points  Months in reverse 0 points  Repeat phrase 0 points  Total Score 0    Screening Tests Health Maintenance  Topic Date Due  . HEMOGLOBIN A1C  08/18/2017  . FOOT EXAM  11/15/2017  . OPHTHALMOLOGY EXAM  11/23/2017  . COLONOSCOPY  07/18/2022  . TETANUS/TDAP  05/26/2025  . INFLUENZA VACCINE  Completed  . Hepatitis C Screening  Completed  . PNA vac Low Risk Adult  Completed         Plan:   I have personally reviewed and noted the following in the patient's chart:   . Medical and social history . Use of alcohol, tobacco or illicit drugs  . Current medications and supplements . Functional ability and status . Nutritional status . Physical activity . Advanced directives . List of other physicians . Hospitalizations, surgeries, and ER visits in previous 12 months . Vitals . Screenings to include cognitive, depression, and falls . Referrals and appointments  In addition, I have reviewed and discussed with patient certain preventive protocols, quality metrics, and best practice recommendations. A written personalized care  plan for preventive services as well as general preventive health recommendations were provided to patient.   1. Financial difficulties - Ambulatory referral to [facility]  2. Encounter for Medicare annual wellness exam  [place], [place], LPN   [id number]

## 2017-04-05 NOTE — Patient Instructions (Addendum)
Mr. Rodney Walsh , Thank you for taking time to come for your Medicare Wellness Visit. I appreciate your ongoing commitment to your health goals. Please review the following plan we discussed and let me know if I can assist you in the future.   Screening recommendations/referrals: Colonoscopy: up to date, next due 07/18/2022 Recommended yearly ophthalmology/optometry visit for glaucoma screening and checkup Recommended yearly dental visit for hygiene and checkup  Vaccinations: Influenza vaccine: up to date Pneumococcal vaccine: up to date Tdap vaccine: up to date, next due 05/26/2025 Shingles vaccine: up to date, You can receive new vaccine at pharmacy     Advanced directives: Advance directive discussed with you today. I have provided a copy for you to complete at home and have notarized. Once this is complete please bring a copy in to our office so we can scan it into your chart.   Conditions/risks identified: Try to start back exercising more often.   Next appointment: 05/26/65 @ 2:20 pm with [place] 67 Years and Older, Male Preventive care refers to lifestyle choices and visits with your health care provider that can promote health and wellness. What does preventive care include?  A yearly physical exam. This is also called an annual well check.  Dental exams once or twice a year.  Routine eye exams. Ask your health care provider how often you should have your eyes checked.  Personal lifestyle choices, including:  Daily care of your teeth and gums.  Regular physical activity.  Eating a healthy diet.  Avoiding tobacco and drug use.  Limiting alcohol use.  Practicing safe sex.  Taking low doses of aspirin every day.  Taking vitamin and mineral supplements as recommended by your health care provider. What happens during an annual well check? The services and screenings done by your health care provider during your annual well check will depend on  your age, overall health, lifestyle risk factors, and family history of disease. Counseling  Your health care provider may ask you questions about your:  Alcohol use.  Tobacco use.  Drug use.  Emotional well-being.  Home and relationship well-being.  Sexual activity.  Eating habits.  History of falls.  Memory and ability to understand (cognition).  Work and work Statistician. Screening  You may have the following tests or measurements:  Height, weight, and BMI.  Blood pressure.  Lipid and cholesterol levels. These may be checked every 5 years, or more frequently if you are over 39 years old.  Skin check.  Lung cancer screening. You may have this screening every year starting at age 26 if you have a 30-pack-year history of smoking and currently smoke or have quit within the past 15 years.  Fecal occult blood test (FOBT) of the stool. You may have this test every year starting at age 37.  Flexible sigmoidoscopy or colonoscopy. You may have a sigmoidoscopy every 5 years or a colonoscopy every 10 years starting at age 60.  Prostate cancer screening. Recommendations will vary depending on your family history and other risks.  Hepatitis C blood test.  Hepatitis B blood test.  Sexually transmitted disease (STD) testing.  Diabetes screening. This is done by checking your blood sugar (glucose) after you have not eaten for a while (fasting). You may have this done every 1-3 years.  Abdominal aortic aneurysm (AAA) screening. You may need this if you are a current or former smoker.  Osteoporosis. You may be screened starting at age 17 if you are at  high risk. Talk with your health care provider about your test results, treatment options, and if necessary, the need for more tests. Vaccines  Your health care provider may recommend certain vaccines, such as:  Influenza vaccine. This is recommended every year.  Tetanus, diphtheria, and acellular pertussis (Tdap, Td) vaccine.  You may need a Td booster every 10 years.  Zoster vaccine. You may need this after age 62.  Pneumococcal 13-valent conjugate (PCV13) vaccine. One dose is recommended after age 48.  Pneumococcal polysaccharide (PPSV23) vaccine. One dose is recommended after age 46. Talk to your health care provider about which screenings and vaccines you need and how often you need them. This information is not intended to replace advice given to you by your health care provider. Make sure you discuss any questions you have with your health care provider. Document Released: 05/22/2015 Document Revised: 01/13/2016 Document Reviewed: 02/24/2015 Elsevier Interactive Patient Education  2017 [place] Prevention in the Home Falls can cause injuries. They can happen to people of all ages. There are many things you can do to make your home safe and to help prevent falls. What can I do on the outside of my home?  Regularly fix the edges of walkways and driveways and fix any cracks.  Remove anything that might make you trip as you walk through a door, such as a raised step or threshold.  Trim any bushes or trees on the path to your home.  Use bright outdoor lighting.  Clear any walking paths of anything that might make someone trip, such as rocks or tools.  Regularly check to see if handrails are loose or broken. Make sure that both sides of any steps have handrails.  Any raised decks and porches should have guardrails on the edges.  Have any leaves, snow, or ice cleared regularly.  Use sand or salt on walking paths during winter.  Clean up any spills in your garage right away. This includes oil or grease spills. What can I do in the bathroom?  Use night lights.  Install grab bars by the toilet and in the tub and shower. Do not use towel bars as grab bars.  Use non-skid mats or decals in the tub or shower.  If you need to sit down in the shower, use a plastic, non-slip stool.  Keep the  floor dry. Clean up any water that spills on the floor as soon as it happens.  Remove soap buildup in the tub or shower regularly.  Attach bath mats securely with double-sided non-slip rug tape.  Do not have throw rugs and other things on the floor that can make you trip. What can I do in the bedroom?  Use night lights.  Make sure that you have a light by your bed that is easy to reach.  Do not use any sheets or blankets that are too big for your bed. They should not hang down onto the floor.  Have a firm chair that has side arms. You can use this for support while you get dressed.  Do not have throw rugs and other things on the floor that can make you trip. What can I do in the kitchen?  Clean up any spills right away.  Avoid walking on wet floors.  Keep items that you use a lot in easy-to-reach places.  If you need to reach something above you, use a strong step stool that has a grab bar.  Keep electrical cords out of the  way.  Do not use floor polish or wax that makes floors slippery. If you must use wax, use non-skid floor wax.  Do not have throw rugs and other things on the floor that can make you trip. What can I do with my stairs?  Do not leave any items on the stairs.  Make sure that there are handrails on both sides of the stairs and use them. Fix handrails that are broken or loose. Make sure that handrails are as long as the stairways.  Check any carpeting to make sure that it is firmly attached to the stairs. Fix any carpet that is loose or worn.  Avoid having throw rugs at the top or bottom of the stairs. If you do have throw rugs, attach them to the floor with carpet tape.  Make sure that you have a light switch at the top of the stairs and the bottom of the stairs. If you do not have them, ask someone to add them for you. What else can I do to help prevent falls?  Wear shoes that:  Do not have high heels.  Have rubber bottoms.  Are comfortable and fit  you well.  Are closed at the toe. Do not wear sandals.  If you use a stepladder:  Make sure that it is fully opened. Do not climb a closed stepladder.  Make sure that both sides of the stepladder are locked into place.  Ask someone to hold it for you, if possible.  Clearly mark and make sure that you can see:  Any grab bars or handrails.  First and last steps.  Where the edge of each step is.  Use tools that help you move around (mobility aids) if they are needed. These include:  Canes.  Walkers.  Scooters.  Crutches.  Turn on the lights when you go into a dark area. Replace any light bulbs as soon as they burn out.  Set up your furniture so you have a clear path. Avoid moving your furniture around.  If any of your floors are uneven, fix them.  If there are any pets around you, be aware of where they are.  Review your medicines with your doctor. Some medicines can make you feel dizzy. This can increase your chance of falling. Ask your doctor what other things that you can do to help prevent falls. This information is not intended to replace advice given to you by your health care provider. Make sure you discuss any questions you have with your health care provider. Document Released: 02/19/2009 Document Revised: 10/01/2015 Document Reviewed: 05/30/2014 Elsevier Interactive Patient Education  2017 [employer].

## 2017-05-18 ENCOUNTER — Other Ambulatory Visit: Payer: Self-pay | Admitting: Physician Assistant

## 2017-05-18 DIAGNOSIS — E785 Hyperlipidemia, unspecified: Secondary | ICD-10-CM

## 2017-05-18 DIAGNOSIS — E1165 Type 2 diabetes mellitus with hyperglycemia: Secondary | ICD-10-CM

## 2017-05-18 DIAGNOSIS — IMO0002 Reserved for concepts with insufficient information to code with codable children: Secondary | ICD-10-CM

## 2017-05-19 ENCOUNTER — Telehealth: Payer: Self-pay

## 2017-05-19 ENCOUNTER — Encounter: Payer: Self-pay | Admitting: Physician Assistant

## 2017-05-19 NOTE — Telephone Encounter (Signed)
Called patient to discuss community resource referral received from nurse health advisor to provide resources for heating and utility assistance help in the community. Talked with patient and shared those resources with him at his request. Gave him my contact number for any additional questions.    Josepha Pigg, B.A.  Care Guide - [facility]

## 2017-05-26 ENCOUNTER — Ambulatory Visit (INDEPENDENT_AMBULATORY_CARE_PROVIDER_SITE_OTHER): Payer: Medicare Other | Admitting: Physician Assistant

## 2017-05-26 ENCOUNTER — Other Ambulatory Visit: Payer: Self-pay

## 2017-05-26 ENCOUNTER — Encounter: Payer: Self-pay | Admitting: Physician Assistant

## 2017-05-26 VITALS — BP 132/80 | HR 68 | Temp 98.5°F | Resp 18 | Ht 64.0 in | Wt 263.4 lb

## 2017-05-26 DIAGNOSIS — E11319 Type 2 diabetes mellitus with unspecified diabetic retinopathy without macular edema: Secondary | ICD-10-CM | POA: Diagnosis not present

## 2017-05-26 DIAGNOSIS — M1711 Unilateral primary osteoarthritis, right knee: Secondary | ICD-10-CM

## 2017-05-26 DIAGNOSIS — M1611 Unilateral primary osteoarthritis, right hip: Secondary | ICD-10-CM

## 2017-05-26 DIAGNOSIS — C61 Malignant neoplasm of prostate: Secondary | ICD-10-CM | POA: Insufficient documentation

## 2017-05-26 DIAGNOSIS — E785 Hyperlipidemia, unspecified: Secondary | ICD-10-CM | POA: Diagnosis not present

## 2017-05-26 DIAGNOSIS — M083 Juvenile rheumatoid polyarthritis (seronegative): Secondary | ICD-10-CM | POA: Diagnosis not present

## 2017-05-26 DIAGNOSIS — R350 Frequency of micturition: Secondary | ICD-10-CM | POA: Diagnosis not present

## 2017-05-26 DIAGNOSIS — I10 Essential (primary) hypertension: Secondary | ICD-10-CM | POA: Diagnosis not present

## 2017-05-26 DIAGNOSIS — M0580 Other rheumatoid arthritis with rheumatoid factor of unspecified site: Secondary | ICD-10-CM | POA: Insufficient documentation

## 2017-05-26 DIAGNOSIS — G4733 Obstructive sleep apnea (adult) (pediatric): Secondary | ICD-10-CM

## 2017-05-26 MED ORDER — TRAMADOL HCL 50 MG PO TABS: 50.0000 mg | ORAL_TABLET | Freq: Three times a day (TID) | ORAL | 0 refills | Status: DC | PRN

## 2017-05-26 NOTE — Progress Notes (Signed)
Subjective:    Patient ID: Rodney Walsh, male    DOB: Oct 11, 1950, 67 y.o.   MRN: [id number]  Chief Complaint  Patient presents with  . Hypertension  . Diabetes  . Hyperlipidemia  . Follow-up   Patient presents for follow-up of DM, HTN, and hyperlipidemia.   He feels good, expect for joint pain. He is having pain in his hip and knees are stiff. The pain is causing difficulty getting dressed in the morning and walking. He was brought into the exam room from the lobby in a wheelchair. He uses a can and Rollator at home. Says his rheumatologist gave him an NSAID which he took for 3 weeks, then stopped because there was no change in the pain. He has had previous cortisone injections in his left shoulder and right hip. Injection worked for the right hip for 2-3 days before the pain returned. He was told to do aquatic exercises, but has yet to start.   He started back on his CPAP in August. Uses every night.   Taking his medications as prescribed. Denies any adverse effects except for constipation. Miralax helps.   When he remembers to check his glucose, it runs from 117-125. He usually checks in the morning.   Review of Systems  As above. No HA, dizziness, nausea, vomiting, diarrhea, rash, fever, chills, swelling, CP, or SOB  Patient Active Problem List   Diagnosis Date Noted  . Prostate cancer ([place]) 05/26/2017  . 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 Polyarthralgia 09/21/2016  . Trochanteric bursitis of right hip 09/21/2016  . Primary osteoarthritis of both hands 09/21/2016  . Rheumatoid factor positive 09/21/2016  . Primary osteoarthritis of right hip 06/02/2016  . Primary osteoarthritis of right knee 06/02/2016  . Urinary frequency 03/15/2016  . Pain in joint, shoulder region 03/15/2016  . Degenerative disc disease, lumbar 10/13/2015  . 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 Diabetic retinopathy ([place]) 10/04/2015  . Nocturia  06/10/2014  . Cataract 09/25/2012  . OSA (obstructive sleep apnea) 08/10/2012  . Eczema 08/10/2012  . Vitamin D deficiency 08/10/2012  . Erectile dysfunction 08/10/2012  . Personal history of adenomatous colonic polyps 04/08/2012  . History of prostate cancer 01/04/2012  . Diabetes type 2, controlled ([place]) 10/25/2011  . HTN (hypertension) 10/25/2011  . Hyperlipidemia LDL goal <70 10/25/2011  . BMI 45.0-49.9, adult ([place]) 10/25/2011   Prior to Admission medications   Medication Sig Start Date End Date Taking? Authorizing Provider  aspirin 81 MG tablet Take 81 mg by mouth daily.   Yes [provider]  Blood Glucose Monitoring Suppl (ONE TOUCH ULTRA SYSTEM KIT) w/Device KIT Use to test blood sugar daily. 10/21/15  Yes Jeffery, Chelle, PA-C  lisinopril (PRINIVIL,ZESTRIL) 20 MG tablet Take 1 tablet (20 mg total) by mouth daily. 02/24/17  Yes Jeffery, Chelle, PA-C  metFORMIN (GLUCOPHAGE) 1000 MG tablet TAKE 1 TABLET BY MOUTH TWICE DAILY WITH A MEAL 05/18/17  Yes Jeffery, Chelle, PA-C  Misc. Devices (ROLLATOR) MISC Use with ambulation. 11/15/16  Yes Jeffery, Chelle, PA-C  naproxen sodium (ALEVE) 220 MG tablet Take 220 mg by mouth.   Yes [provider]  oxybutynin (DITROPAN) 5 MG tablet Take 1 tablet (5 mg total) by mouth 4 (four) times daily. 08/16/16  Yes Jeffery, Chelle, PA-C          No Known Allergies     Objective:   Physical Exam  Constitutional: He is oriented to person, place, and time.  He appears well-developed and well-nourished.  Cardiovascular: Normal rate, regular rhythm, normal heart sounds and intact distal pulses.  Pulses:      Radial pulses are 2+ on the right side, and 2+ on the left side.       Posterior tibial pulses are 2+ on the right side, and 2+ on the left side.  Pulmonary/Chest: Effort normal and breath sounds normal. He exhibits no tenderness.  Musculoskeletal: He exhibits no edema.  Lymphadenopathy:    He has no cervical adenopathy.    Neurological: He is alert and oriented to person, place, and time.  Psychiatric: He has a normal mood and affect. His behavior is normal.   Vitals:   05/26/17 1426  BP: 132/80  Pulse: 68  Resp: 18  Temp: 98.5 F (36.9 C)  SpO2: 98%      Assessment & Plan:  1. 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 Monitor glucose at home. - CBC with Differential/Platelet - Hemoglobin A1c - Comprehensive metabolic panel  2. Essential hypertension Continue Lisinopril. - CBC with Differential/Platelet - Comprehensive metabolic panel  3. Hyperlipidemia LDL goal <70 - CBC with Differential/Platelet - Lipid panel  4. Urinary frequency Continue Oxybutynin. Can increase to 4 times daily. Can switch to extended release if needed.  5. Primary osteoarthritis of right hip 6. Primary osteoarthritis of right knee  - traMADol (ULTRAM) 50 MG tablet; Take 1-2 tablets (50-100 mg total) by mouth every 8 (eight) hours as needed for severe pain.  Dispense: 90 tablet; Refill: 0 - DME Other see comment  7. OSA (obstructive sleep apnea) Continue use of CPAP.  8. Morbid obesity ([place])  9. Prostate cancer ([place])  10. Polyarthritis with positive rheumatoid factor (HCC) - DME Other see comment  Return in about 3 months (around 08/24/2017) for re-evaluation of diabetes, blood pressure, etc.  Noemi Chapel, PA-S

## 2017-05-26 NOTE — Patient Instructions (Addendum)
Let me know when it gets close to time to refill that oxybutynin, and we can try a change to the extended release product. In the meantime, you could try to take it one tablet 4 times a day.    IF you received an x-ray today, you will receive an invoice from [facility]. Please contact [facility] at [phone] with questions or concerns regarding your invoice.   IF you received labwork today, you will receive an invoice from [facility]. Please contact [facility] at [phone] with questions or concerns regarding your invoice.   Our billing staff will not be able to assist you with questions regarding bills from these companies.  You will be contacted with the lab results as soon as they are available. The fastest way to get your results is to activate your My Chart account. Instructions are located on the last page of this paperwork. If you have not heard from [place] regarding the results in 2 weeks, please contact this office.

## 2017-05-26 NOTE — Progress Notes (Signed)
Patient ID: Rodney Walsh, male    DOB: 11/13/1950, 67 y.o.   MRN: [id number]  PCP: [name], PA-C  Chief Complaint  Patient presents with  . Hypertension  . Diabetes  . Hyperlipidemia  . Follow-up    Subjective:   Presents for evaluation of diabetes, HTN, hyperlipidemia. He is accompanied by his wife, Rodney Walsh.  Home glucose readings 117-125, fasting, when he remembers to check.. Pravastatin 40 mg stopped in 2014 due to patient preference. LDL has been 40-43 for the past 2 years. Using CPAP nightly.  RIGHT hip pain is his primary concern today. Constant pain, daily, worse with weight bearing.  Makes balance difficult, he's afraid he may fall. Water exercise recommended, but he has not yet initiated it. Fluoroscopy guided RIGHT hip injection helped for only 1-2 days. Last visit with rheumatology was 02/20/2017.  Having a new shower installed, walk-in, so he doesn't have to step over the tub. Uses a walker at home, a wheelchair or Rollator outside. Difficulty getting up out of seated position, getting in and out of the car. Doesn't go to church, due to pain.  Switched from Aleve to meloxicam, which helped initially. Was alternating them for a while, but now neither works. He is interested in a referral to see Dr. Katy Fitch if the exercises are not effective.    Review of Systems  Constitutional: Negative for activity change, appetite change, fatigue and unexpected weight change.  HENT: Negative for congestion, dental problem, ear pain, hearing loss, mouth sores, postnasal drip, rhinorrhea, sneezing, sore throat, tinnitus and trouble swallowing.   Eyes: Negative for photophobia, pain, redness and visual disturbance.  Respiratory: Negative for cough, chest tightness and shortness of breath.   Cardiovascular: Negative for chest pain, palpitations and leg swelling.  Gastrointestinal: Positive for constipation (uses Miralax with relief). Negative for abdominal  pain, blood in stool, diarrhea, nausea and vomiting.  Endocrine: Negative for cold intolerance, heat intolerance, polydipsia, polyphagia and polyuria.  Genitourinary: Negative for dysuria, frequency, hematuria and urgency.  Musculoskeletal: Positive for arthralgias (hips, knees) and gait problem (due to hip and knee pain). Negative for myalgias and neck stiffness.  Skin: Negative for rash.  Neurological: Negative for dizziness, speech difficulty, weakness, light-headedness, numbness and headaches.  Hematological: Negative for adenopathy.  Psychiatric/Behavioral: Negative for confusion and sleep disturbance. The patient is not nervous/anxious.        Patient Active Problem List   Diagnosis Date Noted  . Polyarthralgia 09/21/2016  . Trochanteric bursitis of right hip 09/21/2016  . Primary osteoarthritis of both hands 09/21/2016  . Rheumatoid factor positive 09/21/2016  . Primary osteoarthritis of right hip 06/02/2016  . Primary osteoarthritis of right knee 06/02/2016  . Urinary frequency 03/15/2016  . Pain in joint, shoulder region 03/15/2016  . Degenerative disc disease, lumbar 10/13/2015  . 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 Diabetic retinopathy ([place]) 10/04/2015  . Nocturia 06/10/2014  . Cataract 09/25/2012  . OSA (obstructive sleep apnea) 08/10/2012  . Eczema 08/10/2012  . Vitamin D deficiency 08/10/2012  . Erectile dysfunction 08/10/2012  . Personal history of adenomatous colonic polyps 04/08/2012  . History of prostate cancer 01/04/2012  . Diabetes type 2, controlled ([place]) 10/25/2011  . HTN (hypertension) 10/25/2011  . Hyperlipidemia LDL goal <70 10/25/2011  . Morbid obesity ([place]) 10/25/2011     Prior to Admission medications   Medication Sig Start Date End Date Taking? Authorizing Provider  aspirin 81 MG tablet Take 81 mg by mouth  daily.   Yes [provider]  Blood Glucose Monitoring Suppl (ONE TOUCH ULTRA SYSTEM KIT) w/Device KIT Use  to test blood sugar daily. 10/21/15  Yes Samika Vetsch, PA-C  lisinopril (PRINIVIL,ZESTRIL) 20 MG tablet Take 1 tablet (20 mg total) by mouth daily. 02/24/17  Yes Kitti Mcclish, PA-C  metFORMIN (GLUCOPHAGE) 1000 MG tablet TAKE 1 TABLET BY MOUTH TWICE DAILY WITH A MEAL: taking only QD 05/18/17  Yes Leiani Enright, PA-C  Misc. Devices (ROLLATOR) MISC Use with ambulation. 11/15/16  Yes Elizer Bostic, PA-C  naproxen sodium (ALEVE) 220 MG tablet Take 220 mg by mouth.   Yes [provider]  oxybutynin (DITROPAN) 5 MG tablet Take 1 tablet (5 mg total) by mouth 4 (four) times daily. 08/16/16  Yes Remberto Lienhard, PA-C     No Known Allergies     Objective:  Physical Exam  Constitutional: He is oriented to person, place, and time. He appears well-developed and well-nourished. He is active and cooperative. No distress.  BP 132/80 (BP Location: Right Arm, Patient Position: Sitting, Cuff Size: Large)   Pulse 68   Temp 98.5 F (36.9 C) (Oral)   Resp 18   Ht _0  (1.626 m)   Wt 263 lb 6.4 oz (119.5 kg)   SpO2 98%   BMI 45.21 kg/m   HENT:  Head: Normocephalic and atraumatic.  Right Ear: Hearing normal.  Left Ear: Hearing normal.  Eyes: Conjunctivae are normal. No scleral icterus.  Neck: Normal range of motion. Neck supple. No thyromegaly present.  Cardiovascular: Normal rate, regular rhythm and normal heart sounds.  Pulses:      Radial pulses are 2+ on the right side, and 2+ on the left side.  Pulmonary/Chest: Effort normal and breath sounds normal.  Musculoskeletal:  Brought to the exam room in a wheel chair due to pain with ambulation  Lymphadenopathy:       Head (right side): No tonsillar, no preauricular, no posterior auricular and no occipital adenopathy present.       Head (left side): No tonsillar, no preauricular, no posterior auricular and no occipital adenopathy present.    He has no cervical adenopathy.       Right: No supraclavicular adenopathy present.       Left:  No supraclavicular adenopathy present.  Neurological: He is alert and oriented to person, place, and time. No sensory deficit.  Skin: Skin is warm, dry and intact. No rash noted. No cyanosis or erythema. Nails show no clubbing.  Psychiatric: He has a normal mood and affect. His speech is normal and behavior is normal.   Wt Readings from Last 3 Encounters:  05/26/17 263 lb 6.4 oz (119.5 kg)  04/05/17 270 lb (122.5 kg)  02/28/17 274 lb (124.3 kg)           Assessment & Plan:   Problem List Items Addressed This Visit    Diabetes type 2, controlled ([place]) - Primary (Chronic)    Has been controlled. Await A1C today. If >7%, will recommend increase metformin to BID.      Relevant Orders   CBC with Differential/Platelet (Completed)   Hemoglobin A1c (Completed)   Comprehensive metabolic panel (Completed)   HTN (hypertension) (Chronic)    COntrolled. Continue lisinopril.      Relevant Orders   CBC with Differential/Platelet (Completed)   Comprehensive metabolic panel (Completed)   Hyperlipidemia LDL goal <70 (Chronic)    LDL has been <70 (actually in the 40s) off statin therapy.  Relevant Orders   CBC with Differential/Platelet (Completed)   Lipid panel (Completed)   OSA (obstructive sleep apnea) (Chronic)    Continue CPAP and follow-up per sleep medicine.      Urinary frequency    Continue oxybutynin      Primary osteoarthritis of right hip    Encourage water exercises recommended by rheumatology. Happy to refer to ortho when he is ready to consider surgery.      Relevant Medications   traMADol (ULTRAM) 50 MG tablet   Other Relevant Orders   DME Other see comment   Primary osteoarthritis of right knee    Encourage water exercises recommended by rheumatology.      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 Encourage  recommendation for water exercise.      Relevant Orders   DME Other see comment    Other Visit Diagnoses    Morbid obesity ([place])   (Chronic)     continue efforts toward healthier eating. Encourage water exercise.       Return in about 3 months (around 08/24/2017) for re-evaluation of diabetes, blood pressure, etc.   Fara Chute, PA-C [facility] This Encounter  Procedures  . DME Other see comment    LIFT chair  . CBC with Differential/Platelet  . Hemoglobin A1c  . Lipid panel  . Comprehensive metabolic panel

## 2017-05-27 LAB — COMPREHENSIVE METABOLIC PANEL
ALT: 12 IU/L (ref 0–44)
AST: 15 IU/L (ref 0–40)
Albumin/Globulin Ratio: 1.5 (ref 1.2–2.2)
Albumin: 4.4 g/dL (ref 3.6–4.8)
Alkaline Phosphatase: 71 IU/L (ref 39–117)
BUN/Creatinine Ratio: 14 (ref 10–24)
BUN: 17 mg/dL (ref 8–27)
Bilirubin Total: 0.3 mg/dL (ref 0.0–1.2)
CALCIUM: 10 mg/dL (ref 8.6–10.2)
CO2: 22 mmol/L (ref 20–29)
CREATININE: 1.22 mg/dL (ref 0.76–1.27)
Chloride: 100 mmol/L (ref 96–106)
GFR, EST AFRICAN AMERICAN: 70 mL/min/{1.73_m2} (ref >59)
GFR, EST NON AFRICAN AMERICAN: 61 mL/min/{1.73_m2} (ref >59)
GLUCOSE: 87 mg/dL (ref 65–99)
Globulin, Total: 3 g/dL (ref 1.5–4.5)
Potassium: 5 mmol/L (ref 3.5–5.2)
Sodium: 141 mmol/L (ref 134–144)
Total Protein: 7.4 g/dL (ref 6.0–8.5)

## 2017-05-27 LAB — CBC WITH DIFFERENTIAL/PLATELET
BASOS ABS: 0 10*3/uL (ref 0.0–0.2)
Basos: 1 %
EOS (ABSOLUTE): 0.1 10*3/uL (ref 0.0–0.4)
Eos: 2 %
Hematocrit: 44 % (ref 37.5–51.0)
Hemoglobin: 14.5 g/dL (ref 13.0–17.7)
IMMATURE GRANS (ABS): 0 10*3/uL (ref 0.0–0.1)
Immature Granulocytes: 0 %
LYMPHS ABS: 1.6 10*3/uL (ref 0.7–3.1)
LYMPHS: 29 %
MCH: 30 pg (ref 26.6–33.0)
MCHC: 33 g/dL (ref 31.5–35.7)
MCV: 91 fL (ref 79–97)
Monocytes Absolute: 0.6 10*3/uL (ref 0.1–0.9)
Monocytes: 11 %
NEUTROS ABS: 3.1 10*3/uL (ref 1.4–7.0)
Neutrophils: 57 %
PLATELETS: 255 10*3/uL (ref 150–379)
RBC: 4.84 x10E6/uL (ref 4.14–5.80)
RDW: 12.7 % (ref 12.3–15.4)
WBC: 5.4 10*3/uL (ref 3.4–10.8)

## 2017-05-27 LAB — LIPID PANEL
CHOL/HDL RATIO: 4 ratio (ref 0.0–5.0)
Cholesterol, Total: 168 mg/dL (ref 100–199)
HDL: 42 mg/dL (ref >39)
LDL Calculated: 101 mg/dL — ABNORMAL HIGH (ref 0–99)
Triglycerides: 124 mg/dL (ref 0–149)
VLDL Cholesterol Cal: 25 mg/dL (ref 5–40)

## 2017-05-27 LAB — HEMOGLOBIN A1C
Est. average glucose Bld gHb Est-mCnc: 134 mg/dL
Hgb A1c MFr Bld: 6.3 % — ABNORMAL HIGH (ref 4.8–5.6)

## 2017-06-11 NOTE — Assessment & Plan Note (Signed)
LDL has been <70 (actually in the 40s) off statin therapy.

## 2017-06-11 NOTE — Assessment & Plan Note (Signed)
COntrolled. Continue lisinopril.

## 2017-06-11 NOTE — Assessment & Plan Note (Signed)
Encourage water exercises recommended by rheumatology.

## 2017-06-11 NOTE — Assessment & Plan Note (Signed)
Continue oxybutynin

## 2017-06-11 NOTE — Assessment & Plan Note (Signed)
Continue per rheumatology. Encourage recommendation for water exercise.

## 2017-06-11 NOTE — Assessment & Plan Note (Signed)
Encourage water exercises recommended by rheumatology. Happy to refer to ortho when he is ready to consider surgery.

## 2017-06-11 NOTE — Assessment & Plan Note (Signed)
Has been controlled. Await A1C today. If >7%, will recommend increase metformin to BID.

## 2017-06-11 NOTE — Assessment & Plan Note (Signed)
Continue CPAP and follow-up per sleep medicine.

## 2017-06-12 ENCOUNTER — Other Ambulatory Visit: Payer: Self-pay | Admitting: Physician Assistant

## 2017-06-12 MED ORDER — ATORVASTATIN CALCIUM 20 MG PO TABS: 20.0000 mg | ORAL_TABLET | Freq: Every day | ORAL | 3 refills | Status: DC

## 2017-06-12 NOTE — Telephone Encounter (Unsigned)
Copied from [place]. Topic: Inquiry >> Jun 12, 2017 12:04 PM Conception Chancy, NT wrote: Patient is requesting Dewitt Hoes to contact her in regards to his AWV in November.

## 2017-06-13 NOTE — Telephone Encounter (Signed)
Left message on voicemail notifying patient I returned his call

## 2017-06-22 ENCOUNTER — Encounter (INDEPENDENT_AMBULATORY_CARE_PROVIDER_SITE_OTHER): Payer: Medicare Other

## 2017-07-07 ENCOUNTER — Encounter: Payer: Self-pay | Admitting: Adult Health

## 2017-07-17 NOTE — Progress Notes (Signed)
Office Visit Note  Patient: Rodney Walsh             Date of Birth: 05-Jan-1951           MRN: [id number]             PCP: [name], PA-C Referring: [name], PA-C Visit Date: 07/28/2017 Occupation: @GUAROCC @    Subjective:  Right knee pain    History of Present Illness: Rodney Walsh is a 67 y.o. male with history of osteoarthritis and degenerative disc disease.  Patient states that he is having increased pain in his right knee.  He states that his right knee swells as well.  He states that he has been walking with a walker or cane wherever he goes.  He states that he has a lot of stiffness in his right knee.  He denies any recent injuries or falls.  Patient states that his right hip has improved.  Patient states he is also having discomfort in his left shoulder especially when he lays on that side.  Patient states that his hands are doing well.  He denies any swelling in his hands.  He states his lower back continues to be stiff.  Patient states he continues to take vitamin D daily.     Activities of Daily Living:  Patient reports morning stiffness for 3-4  hour.   Patient Reports nocturnal pain.  Difficulty dressing/grooming: Denies Difficulty climbing stairs: Reports Difficulty getting out of chair: Reports Difficulty using hands for taps, buttons, cutlery, and/or writing: Denies   Review of Systems  Constitutional: Positive for fatigue. Negative for night sweats.  HENT: Positive for mouth dryness. Negative for mouth sores, trouble swallowing, trouble swallowing and nose dryness.   Eyes: Negative for redness, visual disturbance and dryness.  Respiratory: Negative for cough, hemoptysis, shortness of breath, apnea and difficulty breathing.   Cardiovascular: Negative for chest pain, palpitations, hypertension, irregular heartbeat and swelling in legs/feet.  Gastrointestinal: Positive for constipation. Negative for blood in stool and diarrhea.  Endocrine:  Negative for increased urination.  Genitourinary: Negative for painful urination.  Musculoskeletal: Positive for arthralgias, joint pain, joint swelling and morning stiffness. Negative for myalgias, muscle weakness, muscle tenderness and myalgias.  Skin: Negative for color change, rash, hair loss, nodules/bumps, skin tightness, ulcers and sensitivity to sunlight.  Allergic/Immunologic: Negative for susceptible to infections.  Neurological: Negative for dizziness, fainting, memory loss, night sweats and weakness.  Hematological: Negative for swollen glands.  Psychiatric/Behavioral: Positive for depressed mood. Negative for sleep disturbance. The patient is not nervous/anxious.     PMFS History:  Patient Active Problem List   Diagnosis Date Noted  . 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 Polyarthralgia 09/21/2016  . Trochanteric bursitis of right hip 09/21/2016  . Primary osteoarthritis of both hands 09/21/2016  . Rheumatoid factor positive 09/21/2016  . Primary osteoarthritis of right hip 06/02/2016  . Primary osteoarthritis of right knee 06/02/2016  . Urinary frequency 03/15/2016  . Pain in joint, shoulder region 03/15/2016  . Degenerative disc disease, lumbar 10/13/2015  . 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 Diabetic retinopathy ([place]) 10/04/2015  . Nocturia 06/10/2014  . Cataract 09/25/2012  . OSA (obstructive sleep apnea) 08/10/2012  . Eczema 08/10/2012  . Vitamin D deficiency 08/10/2012  . Erectile dysfunction 08/10/2012  . Personal history of adenomatous colonic polyps 04/08/2012  . History of prostate cancer 01/04/2012  . Diabetes type 2, controlled ([place]) 10/25/2011  . HTN (hypertension) 10/25/2011  .  Hyperlipidemia LDL goal <70 10/25/2011  . BMI 45.0-49.9, adult ([place]) 10/25/2011    Past Medical History:  Diagnosis Date  . Arthritis    left shoulder and left knee  . Diabetes mellitus without complication ([place])   .  Hyperlipidemia   . Hypertension   . Nephrolithiasis   . Prostate cancer [facility])     Family History  Problem Relation Age of Onset  . Cancer Mother        ovarian  . Alzheimer's disease Father   . Hypertension Sister   . Diabetes Brother   . Renal Disease Brother   . Heart disease Brother   . Diabetes Daughter   . Seizures Daughter   . Cancer Sister        ?uterine  . Hypertension Sister   . Diabetes Sister   . Hyperlipidemia Sister   . Hypertension Sister   . Alcohol abuse Son   . Drug abuse Son        heroin, prescription opiates  . Alcohol abuse Son   . Colon cancer Neg Hx    Past Surgical History:  Procedure Laterality Date  . BACK SURGERY    . COLONOSCOPY N/A 07/17/2012   Procedure: COLONOSCOPY;  Surgeon: Gatha Mayer, MD;  Location: [facility];  Service: Endoscopy;  Laterality: N/A;  . PROSTATE SURGERY     with seed placement   Social History   Social History Narrative   Lives with his wife and their youngest son Paramedic. Has four sons and one daughter. Has 9 grandchildren.      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    Drinks 2 cups of coffee a day      Objective: Vital Signs: BP 126/78 (BP Location: Left Arm, Patient Position: Sitting, Cuff Size: Large)   Pulse 64   Resp 18   Ht 5\' 4"  (1.626 m)   Wt 259 lb (117.5 kg)   BMI 44.46 kg/m    Physical Exam  Constitutional: He is oriented to person, place, and time. He appears well-developed and well-nourished.  HENT:  Head: Normocephalic and atraumatic.  Eyes: Pupils are equal, round, and reactive to light. Conjunctivae and EOM are normal.  Neck: Normal range of motion. Neck supple.  Cardiovascular: Normal rate, regular rhythm and normal heart sounds.  Pulmonary/Chest: Effort normal and breath sounds normal.  Abdominal: Soft. Bowel sounds are normal.  Neurological: He is alert and oriented to person, place, and time.  Skin: Skin is warm and dry. Capillary refill takes less than 2 seconds.  Psychiatric: He has a  normal mood and affect. His behavior is normal.  Nursing note and vitals reviewed.    Musculoskeletal Exam: C-spine good  range of motion.  Thoracic and lumbar spine good range of motion.  No midline spinal tenderness.  No SI joint tenderness.  Shoulder joints, elbow joints, wrist joints, MCPs, PIPs, DIPs good range of motion with no synovitis.  PIP and DIP synovial thickening consistent with osteoarthritis.  He has significant discomfort with range of motion of his right knee.  He has a varus deformity of the right knee.  No warmth or effusion.  He has bilateral knee crepitus.  Ankle joints MCPs PIPs DIPs good range of motion with no synovitis.  No trochanteric bursa tenderness.  CDAI Exam: No CDAI exam completed.    Investigation: No additional findings. CBC Latest Ref Rng & Units 05/26/2017 02/17/2017 11/22/2016  WBC 3.4 - 10.8 x10E3/uL 5.4 3.9 4.4  Hemoglobin 13.0 - 17.7 g/dL  14.5 13.3 13.2  Hematocrit 37.5 - 51.0 % 44.0 40.1 39.5  Platelets 150 - 379 x10E3/uL 255 245 272   CMP Latest Ref Rng & Units 05/26/2017 02/17/2017 11/22/2016  Glucose 65 - 99 mg/dL 87 125(H) 107(H)  BUN 8 - 27 mg/dL 17 14 11   Creatinine 0.76 - 1.27 mg/dL 1.22 1.04 1.07  Sodium 134 - 144 mmol/L 141 142 138  Potassium 3.5 - 5.2 mmol/L 5.0 4.7 5.0  Chloride 96 - 106 mmol/L 100 102 99  CO2 20 - 29 mmol/L 22 24 23   Calcium 8.6 - 10.2 mg/dL 10.0 9.5 10.3(H)  Total Protein 6.0 - 8.5 g/dL 7.4 6.8 -  Total Bilirubin 0.0 - 1.2 mg/dL 0.3 0.3 -  Alkaline Phos 39 - 117 IU/L 71 74 -  AST 0 - 40 IU/L 15 14 -  ALT 0 - 44 IU/L 12 9 -    Imaging: No results found.  Speciality Comments: No specialty comments available.    Procedures:  Large Joint Inj: R knee on 07/28/2017 10:07 AM Indications: pain Details: 27 G 1.5 in needle, medial approach  Arthrogram: No  Medications: 1.5 mL lidocaine 1 %; 40 mg triamcinolone acetonide 40 MG/ML Aspirate: 0 mL Outcome: tolerated well, no immediate complications Procedure,  treatment alternatives, risks and benefits explained, specific risks discussed. Consent was given by the patient. Immediately prior to procedure a time out was called to verify the correct patient, procedure, equipment, support staff and site/side marked as required. Patient was prepped and draped in the usual sterile fashion.     Allergies: Patient has no known allergies.   Assessment / Plan:     Visit Diagnoses: Rheumatoid factor positive - RF 23, anti-CCP negative, ultrasound negative for synovitis in the past  Primary osteoarthritis of right hip: Range of motion has improved.  He does not have any discomfort in his right hip at this time.  Performs exercises at home which has helped his hip pain.  Chronic left shoulder pain: He has limited active range of motion with left shoulder.  Passive range of motion to 120 degrees of abduction.  He has discomfort in his left shoulder especially when laying on it at night.  Chronic pain of right knee: He has significant discomfort of his right knee.  He has a varus deformity of right knee.  He has limited range of motion with discomfort.  He has right knee crepitus.  No warmth or effusion on exam.  He requested a cortisone injection of his right knee today.  He tolerated the procedure well.  He was advised to monitor his blood sugars closely.  He was encouraged to continue to work on range of motion.  Primary osteoarthritis of right knee: Varus deformity noted.  He had a cortisone injection of his right knee today.  Primary osteoarthritis of both hands: PIP and DIP synovial thickening consistent with ulcer arthritis.  He has no discomfort in his hands.  No synovitis was noted on exam.  Joint protection and muscle strengthening were discussed.  DDD (degenerative disc disease), lumbar: No midline spinal tenderness.  He has lower back stiffness.  History of vitamin D deficiency: He takes vitamin D supplement daily.  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  History of sleep apnea  History of diabetes mellitus: He  was advised to monitor his blood glucose levels very closely following the cortisone injection today.  History of prostate cancer     Orders: Orders Placed This Encounter  Procedures  . Large Joint Inj   No orders of the defined types were placed in this encounter.   Face-to-face time spent with patient was 30 minutes. 50% of time was spent in counseling and coordination of care.  Follow-Up Instructions: No follow-ups on file.   Ofilia Neas, PA-C   I examined and evaluated the patient with Hazel Sams PA.  Patient was having significant pain in his right knee joint today.  No warmth is noted on my examination.  We decided to go ahead and inject with cortisone.  I also discussed with him future Visco supplement injections as he is diabetic.  The plan of care was discussed as noted above.  Bo Merino, MD  Note - This record has been created using Editor, commissioning.  Chart creation errors have been sought, but may not always  have been located. Such creation errors do not reflect on  the standard of medical care.

## 2017-07-26 ENCOUNTER — Ambulatory Visit: Payer: Medicare Other | Admitting: Rheumatology

## 2017-07-28 ENCOUNTER — Ambulatory Visit (INDEPENDENT_AMBULATORY_CARE_PROVIDER_SITE_OTHER): Payer: Medicare Other | Admitting: Physician Assistant

## 2017-07-28 ENCOUNTER — Encounter: Payer: Self-pay | Admitting: Rheumatology

## 2017-07-28 VITALS — BP 126/78 | HR 64 | Resp 18 | Ht 64.0 in | Wt 259.0 lb

## 2017-07-28 DIAGNOSIS — M25561 Pain in right knee: Secondary | ICD-10-CM

## 2017-07-28 DIAGNOSIS — M5136 Other intervertebral disc degeneration, lumbar region: Secondary | ICD-10-CM

## 2017-07-28 DIAGNOSIS — M1711 Unilateral primary osteoarthritis, right knee: Secondary | ICD-10-CM

## 2017-07-28 DIAGNOSIS — M19042 Primary osteoarthritis, left hand: Secondary | ICD-10-CM

## 2017-07-28 DIAGNOSIS — Z8546 Personal history of malignant neoplasm of prostate: Secondary | ICD-10-CM | POA: Diagnosis not present

## 2017-07-28 DIAGNOSIS — E13311 Other specified diabetes mellitus with unspecified diabetic retinopathy with macular edema: Secondary | ICD-10-CM | POA: Diagnosis not present

## 2017-07-28 DIAGNOSIS — Z8669 Personal history of other diseases of the nervous system and sense organs: Secondary | ICD-10-CM | POA: Diagnosis not present

## 2017-07-28 DIAGNOSIS — Z8639 Personal history of other endocrine, nutritional and metabolic disease: Secondary | ICD-10-CM

## 2017-07-28 DIAGNOSIS — R768 Other specified abnormal immunological findings in serum: Secondary | ICD-10-CM

## 2017-07-28 DIAGNOSIS — G8929 Other chronic pain: Secondary | ICD-10-CM

## 2017-07-28 DIAGNOSIS — M1611 Unilateral primary osteoarthritis, right hip: Secondary | ICD-10-CM

## 2017-07-28 DIAGNOSIS — Z860100 Personal history of colon polyps, unspecified: Secondary | ICD-10-CM

## 2017-07-28 DIAGNOSIS — R7689 Other specified abnormal immunological findings in serum: Secondary | ICD-10-CM

## 2017-07-28 DIAGNOSIS — M25512 Pain in left shoulder: Secondary | ICD-10-CM | POA: Diagnosis not present

## 2017-07-28 DIAGNOSIS — Z8679 Personal history of other diseases of the circulatory system: Secondary | ICD-10-CM | POA: Diagnosis not present

## 2017-07-28 DIAGNOSIS — M19041 Primary osteoarthritis, right hand: Secondary | ICD-10-CM | POA: Diagnosis not present

## 2017-07-28 DIAGNOSIS — M51369 Other intervertebral disc degeneration, lumbar region without mention of lumbar back pain or lower extremity pain: Secondary | ICD-10-CM

## 2017-07-28 MED ORDER — LIDOCAINE HCL 1 % IJ SOLN
1.5000 mL | INTRAMUSCULAR | Status: AC | PRN
  Administered 2017-07-28: 1.5 mL

## 2017-07-28 MED ORDER — TRIAMCINOLONE ACETONIDE 40 MG/ML IJ SUSP
40.0000 mg | INTRAMUSCULAR | Status: AC | PRN
  Administered 2017-07-28: 40 mg via INTRA_ARTICULAR

## 2017-08-14 ENCOUNTER — Encounter: Payer: Self-pay | Admitting: Physician Assistant

## 2017-08-15 ENCOUNTER — Encounter: Payer: Self-pay | Admitting: Physician Assistant

## 2017-08-31 ENCOUNTER — Ambulatory Visit (INDEPENDENT_AMBULATORY_CARE_PROVIDER_SITE_OTHER): Payer: Medicare Other | Admitting: Adult Health

## 2017-08-31 ENCOUNTER — Encounter: Payer: Self-pay | Admitting: Adult Health

## 2017-08-31 ENCOUNTER — Ambulatory Visit: Payer: Medicare Other | Admitting: Adult Health

## 2017-08-31 VITALS — BP 112/62 | HR 84 | Ht 64.0 in | Wt 254.0 lb

## 2017-08-31 DIAGNOSIS — Z9989 Dependence on other enabling machines and devices: Secondary | ICD-10-CM

## 2017-08-31 DIAGNOSIS — G4733 Obstructive sleep apnea (adult) (pediatric): Secondary | ICD-10-CM

## 2017-08-31 NOTE — Patient Instructions (Signed)
Your Plan:  Continue using CPAP nightly and >4 hours each night If your symptoms worsen or you develop new symptoms please let us know.   Thank you for coming to see us at [facility]. I hope we have been able to provide you high quality care today.  You may receive a patient satisfaction survey over the next few weeks. We would appreciate your feedback and comments so that we may continue to improve ourselves and the health of our patients.  

## 2017-08-31 NOTE — Progress Notes (Addendum)
PATIENT: Rodney Walsh DOB: 10/21/1950  REASON FOR VISIT: follow up HISTORY FROM: patient  HISTORY OF PRESENT ILLNESS: Today 08/31/17:  Mr. Cedar is a 67 year old male with a history of obstructive sleep apnea on CPAP.  He returns today for compliance download.  His download indicates that he use his machine 28 out of 30 days for compliance of 96.6%.  He uses machine greater than 4 hours for compliance of 89.7%.  On average he uses his machine 5 hours and 51 minutes.  His residual AHI is 2.3 on 15 cm of water.  Overall the patient reports that  he has noticed a benefit when he is able to use the machine.  He states that he did have an issue with his water chamber and was unable to use the machine for several days in March.  Overall he feels that he is doing well.  He returns today for evaluation.   HISTORY 23/2018 (Copied from Dr. [place] Sabin notes) : I reviewed his CPAP compliance data from 01/28/2017 through 02/26/2017 which is a total of 30 days, during which time he used his machine 22 days with percent used days greater than 4 hours at 36.7%, indicating low compliance with an average usage for days on treatment of only 4 hours and 2 minutes, residual AHI at goal at 2.8 per hour, leak at times high, pressure at 15 cm. Of note, in the month of August through September, 12/14/2016 through 01/12/2017 he had a compliance percentage of 100% for greater than 4 hours usage, averaging 7 hours and 10 min. Leak was high at the time. AHI 2.3 per hour on a pressure of 15 cm. He reports doing okay with his new CPAP, he believes that his humidifier may be broken and he will make an appointment with his DME provider to have the machine looked at. He started on 12/14/16. Felt well in the beginning, but now mouth is too dry and CPAP is difficult to tolerate. He was using purified water, not distilled water. He felt great the first. Stopped meds for Hip pain. Takes aleve OTC. He fulfilled criteria for compliance  criteria for CPAP usage in the first month of his CPAP start. He also noticed significant improvement in his sleep quality and daytime tiredness. He is motivated to continue to use CPAP regularly.     REVIEW OF SYSTEMS: Out of a complete 14 system review of symptoms, the patient complains only of the following symptoms, and all other reviewed systems are negative.  ESS 10  ALLERGIES: No Known Allergies  HOME MEDICATIONS: Outpatient Medications Prior to Visit  Medication Sig Dispense Refill  . aspirin 81 MG tablet Take 81 mg by mouth daily.    . Blood Glucose Monitoring Suppl (ONE TOUCH ULTRA SYSTEM KIT) w/Device KIT Use to test blood sugar daily. 1 each 0  . lisinopril (PRINIVIL,ZESTRIL) 20 MG tablet Take 1 tablet (20 mg total) by mouth daily. 90 tablet 3  . metFORMIN (GLUCOPHAGE) 1000 MG tablet TAKE 1 TABLET BY MOUTH TWICE DAILY WITH A MEAL 180 tablet 1  . naproxen sodium (ALEVE) 220 MG tablet Take 220 mg by mouth daily as needed.     Marland Kitchen oxybutynin (DITROPAN) 5 MG tablet Take 1 tablet (5 mg total) by mouth 4 (four) times daily. 360 tablet 3  . traMADol (ULTRAM) 50 MG tablet Take 1-2 tablets (50-100 mg total) by mouth every 8 (eight) hours as needed for severe pain. 90 tablet 0  . Vitamin D, Ergocalciferol, (  DRISDOL) 50000 units CAPS capsule 50,000 Units every 7 (seven) days.    . Misc. Devices (ROLLATOR) MISC Use with ambulation. (Patient not taking: Reported on 08/31/2017) 1 each 0   No facility-administered medications prior to visit.     PAST MEDICAL HISTORY: Past Medical History:  Diagnosis Date  . Arthritis    left shoulder and left knee  . Arthritis    r hip, r knee  . Diabetes mellitus without complication ([place])   . Hyperlipidemia   . Hypertension   . Nephrolithiasis   . OSA on CPAP   . Prostate cancer ([place])     PAST SURGICAL HISTORY: Past Surgical History:  Procedure Laterality Date  . BACK SURGERY    . COLONOSCOPY N/A 07/17/2012   Procedure: COLONOSCOPY;   Surgeon: Gatha Mayer, MD;  Location: [facility];  Service: Endoscopy;  Laterality: N/A;  . PROSTATE SURGERY     with seed placement    FAMILY HISTORY: Family History  Problem Relation Age of Onset  . Cancer Mother        ovarian  . Alzheimer's disease Father   . Hypertension Sister   . Diabetes Brother   . Renal Disease Brother   . Heart disease Brother   . Diabetes Daughter   . Seizures Daughter   . Cancer Sister        ?uterine  . Hypertension Sister   . Diabetes Sister   . Hyperlipidemia Sister   . Hypertension Sister   . Alcohol abuse Son   . Drug abuse Son        heroin, prescription opiates  . Alcohol abuse Son   . Colon cancer Neg Hx     SOCIAL HISTORY: Social History   Socioeconomic History  . Marital status: Married    Spouse name: Deb  . Number of children: 5  . Years of education: 58  . Highest education level: High school graduate  Occupational History  . Occupation: BUS DRIVER    Employer: [employer]: Retired   Scientific laboratory technician  . Financial resource strain: Not very hard  . Food insecurity:    Worry: Never true    Inability: Never true  . Transportation needs:    Medical: No    Non-medical: No  Tobacco Use  . Smoking status: Former Smoker    Packs/day: 1.00    Years: 45.00    Pack years: 45.00    Types: Cigarettes  . Smokeless tobacco: Never Used  . Tobacco comment: Quit in 2008  Substance and Sexual Activity  . Alcohol use: No  . Drug use: Never  . Sexual activity: Yes    Partners: Female  Lifestyle  . Physical activity:    Days per week: 0 days    Minutes per session: 0 min  . Stress: Rather much  Relationships  . Social connections:    Talks on phone: More than three times a week    Gets together: More than three times a week    Attends religious service: More than 4 times per year    Active member of club or organization: No    Attends meetings of clubs or organizations: Never    Relationship status:  Married  . Intimate partner violence:    Fear of current or ex partner: No    Emotionally abused: No    Physically abused: No    Forced sexual activity: No  Other Topics Concern  . Not on file  Social History Narrative   Lives with his wife and their youngest son Paramedic. Has four sons and one daughter. Has 9 grandchildren.      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    Drinks 2 cups of coffee a day       PHYSICAL EXAM  Vitals:   08/31/17 1355  BP: 112/62  Pulse: 84  Weight: 254 lb (115.2 kg)  Height: '5\' 4"'  (1.626 m)   Body mass index is 43.6 kg/m.  Generalized: Well developed, in no acute distress   Neurological examination  Mentation: Alert oriented to time, place, history taking. Follows all commands speech and language fluent Cranial nerve II-XII: Pupils were equal round reactive to light. Extraocular movements were full, visual field were full on confrontational test. Facial sensation and strength were normal. Uvula tongue midline. Head turning and shoulder shrug  were normal and symmetric.  Neck circumference 20 inches, Mallampati 4+ Motor: The motor testing reveals 5 over 5 strength of all 4 extremities. Good symmetric motor tone is noted throughout.  Sensory: Sensory testing is intact to soft touch on all 4 extremities. No evidence of extinction is noted.  Coordination: Cerebellar testing reveals good finger-nose-finger and heel-to-shin bilaterally.  Gait and station: Patient uses a Rollator when ambulating Reflexes: Deep tendon reflexes are symmetric and normal bilaterally.   DIAGNOSTIC DATA (LABS, IMAGING, TESTING) - I reviewed patient records, labs, notes, testing and imaging myself where available.  Lab Results  Component Value Date   WBC 5.4 05/26/2017   HGB 14.5 05/26/2017   HCT 44.0 05/26/2017   MCV 91 05/26/2017   PLT 255 05/26/2017      Component Value Date/Time   NA 141 05/26/2017 1556   K 5.0 05/26/2017 1556   CL 100 05/26/2017 1556   CO2 22 05/26/2017 1556    GLUCOSE 87 05/26/2017 1556   GLUCOSE 137 (H) 03/15/2016 1209   BUN 17 05/26/2017 1556   CREATININE 1.22 05/26/2017 1556   CREATININE 0.95 03/15/2016 1209   CALCIUM 10.0 05/26/2017 1556   CALCIUM 8.9 03/31/2008 0540   PROT 7.4 05/26/2017 1556   ALBUMIN 4.4 05/26/2017 1556   AST 15 05/26/2017 1556   ALT 12 05/26/2017 1556   ALKPHOS 71 05/26/2017 1556   BILITOT 0.3 05/26/2017 1556   GFRNONAA 61 05/26/2017 1556   GFRNONAA 53 (L) 02/12/2014 1817   GFRAA 70 05/26/2017 1556   GFRAA 61 02/12/2014 1817   Lab Results  Component Value Date   CHOL 168 05/26/2017   HDL 42 05/26/2017   LDLCALC 101 (H) 05/26/2017   TRIG 124 05/26/2017   CHOLHDL 4.0 05/26/2017   Lab Results  Component Value Date   HGBA1C 6.3 (H) 05/26/2017   Lab Results  Component Value Date   VITAMINB12 190 (L) 04/16/2007   Lab Results  Component Value Date   TSH 1.25 02/09/2016      ASSESSMENT AND PLAN 67 y.o. year old male  has a past medical history of Arthritis, Arthritis, Diabetes mellitus without complication ([place]), Hyperlipidemia, Hypertension, Nephrolithiasis, OSA on CPAP, and Prostate cancer ([place]). here with:  1.  Obstructive sleep apnea on CPAP  The patient CPAP download shows excellent compliance and good treatment of his apnea.  He is advised that if his symptoms worsen or he develops new symptoms he should let us know.  He will follow-up in 1 year or sooner if needed.  I spent 15 minutes with the patient. 50% of this time was spent reviewing his CPAP download  Ward Givens, MSN, NP-C 08/31/2017, 1:54 PM [facility] [address] [place], [place] [address] [phone]  I reviewed the above note and documentation by the Nurse Practitioner and agree with the history, physical exam, assessment and plan as outlined above. I was immediately available for face-to-face consultation. Star Age, MD, PhD [facility])

## 2017-09-09 ENCOUNTER — Other Ambulatory Visit: Payer: Self-pay | Admitting: Physician Assistant

## 2017-09-09 DIAGNOSIS — R351 Nocturia: Secondary | ICD-10-CM

## 2017-09-11 ENCOUNTER — Encounter: Payer: Self-pay | Admitting: Rheumatology

## 2017-09-11 NOTE — Telephone Encounter (Signed)
Patient is requesting a refill of the following medications: Requested Prescriptions   Pending Prescriptions Disp Refills  . oxybutynin (DITROPAN) 5 MG tablet [Pharmacy Med Name: OXYBUTYNIN 5MG  TAB] 360 tablet 3    Sig: TAKE 1 TABLET BY MOUTH 4 TIMES DAILY    Date of patient request: 09/09/17 Last office visit: 05/26/17 Date of last refill: 08/16/16 Last refill amount: #360 3RF Follow up time period per chart: 09/12/17

## 2017-09-12 ENCOUNTER — Encounter: Payer: Self-pay | Admitting: Physician Assistant

## 2017-09-12 ENCOUNTER — Ambulatory Visit (INDEPENDENT_AMBULATORY_CARE_PROVIDER_SITE_OTHER): Payer: Medicare Other | Admitting: Physician Assistant

## 2017-09-12 ENCOUNTER — Other Ambulatory Visit: Payer: Self-pay

## 2017-09-12 VITALS — BP 120/78 | HR 94 | Temp 98.3°F | Resp 18 | Ht 64.0 in | Wt 250.6 lb

## 2017-09-12 DIAGNOSIS — R351 Nocturia: Secondary | ICD-10-CM

## 2017-09-12 DIAGNOSIS — M083 Juvenile rheumatoid polyarthritis (seronegative): Secondary | ICD-10-CM

## 2017-09-12 DIAGNOSIS — I10 Essential (primary) hypertension: Secondary | ICD-10-CM | POA: Diagnosis not present

## 2017-09-12 DIAGNOSIS — M1611 Unilateral primary osteoarthritis, right hip: Secondary | ICD-10-CM

## 2017-09-12 DIAGNOSIS — M1711 Unilateral primary osteoarthritis, right knee: Secondary | ICD-10-CM | POA: Diagnosis not present

## 2017-09-12 DIAGNOSIS — E785 Hyperlipidemia, unspecified: Secondary | ICD-10-CM | POA: Diagnosis not present

## 2017-09-12 DIAGNOSIS — E11319 Type 2 diabetes mellitus with unspecified diabetic retinopathy without macular edema: Secondary | ICD-10-CM

## 2017-09-12 DIAGNOSIS — G4733 Obstructive sleep apnea (adult) (pediatric): Secondary | ICD-10-CM

## 2017-09-12 DIAGNOSIS — M0580 Other rheumatoid arthritis with rheumatoid factor of unspecified site: Secondary | ICD-10-CM

## 2017-09-12 DIAGNOSIS — Z6841 Body Mass Index (BMI) 40.0 and over, adult: Secondary | ICD-10-CM

## 2017-09-12 MED ORDER — HYDROCODONE-ACETAMINOPHEN 5-325 MG PO TABS: 1.0000 | ORAL_TABLET | Freq: Four times a day (QID) | ORAL | 0 refills | Status: AC | PRN

## 2017-09-12 MED ORDER — OXYBUTYNIN CHLORIDE 5 MG PO TABS: 5.0000 mg | ORAL_TABLET | Freq: Four times a day (QID) | ORAL | 3 refills | Status: DC

## 2017-09-12 NOTE — Progress Notes (Signed)
Subjective:    Patient ID: Rodney Walsh, male    DOB: Oct 25, 1950, 67 y.o.   MRN: [id number] Chief Complaint  Patient presents with  . Hip Pain    Pt states hip and knee pain are getting worse. Pt states he has had almost falls.  . Knee Pain  . Follow-up    HPI  67 yo male presents for evaluation of RIGHT hip pain. Hx of positive rheumatoid factor, OA of knees and hips, and metabolic syndrome (DM, HTN, HLD, Obesity).  He was seen by rheumatology 07/28/2017 and state his right hip pain had improved. He had received a steroid shot in his knee at this visit, no relief.  Pain is 10/10, worse with walking, laying on it (sharp pain shoot up into groin), better with rest. He just started omega 3 capsules (yesterday) and cinnamon capsules (1 week ago) as well as CBD cream 250 mg. He notes the CBD cream has helped the most. He massages it into his knee and hip.   He feels like his knee is so weak that it may break if he walks on it.  He is scheduled to get more injections of "gel" once approved by his insurance.   Review of Systems   As above.  Patient Active Problem List   Diagnosis Date Noted  . 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 Polyarthralgia 09/21/2016  . Trochanteric bursitis of right hip 09/21/2016  . Primary osteoarthritis of both hands 09/21/2016  . Rheumatoid factor positive 09/21/2016  . Primary osteoarthritis of right hip 06/02/2016  . Primary osteoarthritis of right knee 06/02/2016  . Urinary frequency 03/15/2016  . Pain in joint, shoulder region 03/15/2016  . Degenerative disc disease, lumbar 10/13/2015  . 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 Diabetic retinopathy ([place]) 10/04/2015  . Nocturia 06/10/2014  . Cataract 09/25/2012  . OSA (obstructive sleep apnea) 08/10/2012  . Eczema 08/10/2012  . Vitamin D deficiency 08/10/2012  . Erectile dysfunction 08/10/2012  . Personal history of adenomatous colonic polyps  04/08/2012  . History of prostate cancer 01/04/2012  . Diabetes type 2, controlled ([place]) 10/25/2011  . HTN (hypertension) 10/25/2011  . Hyperlipidemia LDL goal <70 10/25/2011  . BMI 45.0-49.9, adult ([place]) 10/25/2011    Past Medical History:  Diagnosis Date  . Arthritis    left shoulder and left knee  . Arthritis    r hip, r knee  . Diabetes mellitus without complication ([place])   . Hyperlipidemia   . Hypertension   . Nephrolithiasis   . OSA on CPAP   . Prostate cancer [facility])     Prior to Admission medications   Medication Sig Start Date End Date Taking? Authorizing Provider  aspirin 81 MG tablet Take 81 mg by mouth daily.   Yes [provider]  Blood Glucose Monitoring Suppl (ONE TOUCH ULTRA SYSTEM KIT) w/Device KIT Use to test blood sugar daily. 10/21/15  Yes Jeffery, Chelle, PA-C  lisinopril (PRINIVIL,ZESTRIL) 20 MG tablet Take 1 tablet (20 mg total) by mouth daily. 02/24/17  Yes Jeffery, Chelle, PA-C  metFORMIN (GLUCOPHAGE) 1000 MG tablet TAKE 1 TABLET BY MOUTH TWICE DAILY WITH A MEAL 05/18/17  Yes Jeffery, Chelle, PA-C  naproxen sodium (ALEVE) 220 MG tablet Take 220 mg by mouth daily as needed.    Yes [provider]  oxybutynin (DITROPAN) 5 MG tablet Take 1 tablet (5 mg total) by mouth 4 (four) times daily. 09/12/17  Yes [name], PA-C  Vitamin D, Ergocalciferol, (DRISDOL) 50000 units CAPS capsule 50,000 Units every 7 (seven) days. 08/08/17  Yes [provider]  HYDROcodone-acetaminophen (NORCO/VICODIN) 5-325 MG tablet Take 1 tablet by mouth every 6 (six) hours as needed for severe pain. 09/12/17 10/12/17  [name], PA-C  Misc. Devices (ROLLATOR) MISC Use with ambulation. Patient not taking: Reported on 08/31/2017 11/15/16   [name], PA-C  oxybutynin (DITROPAN) 5 MG tablet Take 1 tablet (5 mg total) by mouth 2 (two) times daily. 06/16/11 08/01/12  [name], PA-C    No Known Allergies     Objective:   Physical Exam  Constitutional:  He is oriented to person, place, and time. He appears well-developed and well-nourished. No distress.  BP 120/78 (BP Location: Right Arm, Patient Position: Sitting, Cuff Size: Large)   Pulse 94   Temp 98.3 F (36.8 C) (Oral)   Resp 18   Ht _0  (1.626 m)   Wt 250 lb 9.6 oz (113.7 kg)   SpO2 97%   BMI 43.02 kg/m    Patient in wheel chair  HENT:  Head: Normocephalic and atraumatic.  Neck: Normal range of motion.  Neurological: He is alert and oriented to person, place, and time.  Skin: He is not diaphoretic.  Psychiatric: He has a normal mood and affect. His behavior is normal.          Assessment & Plan:  1. 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 Essential hypertension Await labs and f/u - Comprehensive metabolic panel  3. Hyperlipidemia LDL goal <70 Await labs and f/u - Lipid panel - Comprehensive metabolic panel  4. Nocturia - oxybutynin (DITROPAN) 5 MG tablet; Take 1 tablet (5 mg total) by mouth 4 (four) times daily.  Dispense: 360 tablet; Refill: 3  5. BMI 45.0-49.9, adult [facility]) Exercise difficult due to pain, informed to try water aerobics or swimming with his wife.  6. OSA (obstructive sleep apnea) Patient scheduled for titration.  7. Polyarthritis with positive rheumatoid factor (HCC) Due to increasing pain and inability to perform ADL due to pain, hydrocodone beneficial until patient able to receive further treatment for Rheumatoid arthritis and his OA.  - Ambulatory referral to Rheumatology - HYDROcodone-acetaminophen (NORCO/VICODIN) 5-325 MG tablet; Take 1 tablet by mouth every 6 (six) hours as needed for severe pain.  Dispense: 30 tablet; Refill: 0  8. Primary osteoarthritis of right hip - HYDROcodone-acetaminophen (NORCO/VICODIN) 5-325  MG tablet; Take 1 tablet by mouth every 6 (six) hours as needed for severe pain.  Dispense: 30 tablet; Refill: 0  9. Primary osteoarthritis of right knee - HYDROcodone-acetaminophen (NORCO/VICODIN) 5-325 MG tablet; Take 1 tablet by mouth every 6 (six) hours as needed for severe pain.  Dispense: 30 tablet; Refill: 0

## 2017-09-12 NOTE — Patient Instructions (Addendum)
Go ahead and call [facility] to schedule your next visit with me there. [phone].  Stop the tramadol. Use the hydrocodone AS NEEDED for severe pain.    IF you received an x-ray today, you will receive an invoice from [facility]. Please contact [facility] at [phone] with questions or concerns regarding your invoice.   IF you received labwork today, you will receive an invoice from [facility]. Please contact [facility] at [phone] with questions or concerns regarding your invoice.   Our billing staff will not be able to assist you with questions regarding bills from these companies.  You will be contacted with the lab results as soon as they are available. The fastest way to get your results is to activate your My Chart account. Instructions are located on the last page of this paperwork. If you have not heard from [place] regarding the results in 2 weeks, please contact this office.

## 2017-09-12 NOTE — Progress Notes (Signed)
Patient ID: Rodney Walsh, male    DOB: 1951/01/28, 67 y.o.   MRN: [id number]  PCP: [name], PA-C  Chief Complaint  Patient presents with  . Hip Pain    Pt states hip and knee pain are getting worse. Pt states he has had almost falls.  . Knee Pain  . Follow-up    Subjective:   Presents for evaluation of diabetes, hypertension, hyperlipidemia.  He also reports continued and worsening hip and knee pain.  He is accompanied by his wife, who is also being seen today for respiratory illness.  Most recent visit with rheumatology was 07/28/2017.  Hip pain had improved at that visit and he received a steroid injection in the right knee with no benefit.  He rates his pain 10/10, worse with weightbearing and ambulation.  Right side-lying causes sharp pain to shoot into the groin.  He has recently started using CBD cream which he massages into the knee and hip.  Last week he added cinnamon capsules and yesterday added omega-3's.  He describes weakness in the left knee feeling as though it may break when he weight bears.  He is waiting for insurance authorization of "gel" injections into the knee.  Tolerates his regular medications.  Generally is not checking home blood pressure or blood sugar.  He continues to work on [employer].    Review of Systems As above. No chest pain, shortness of breath, blurred vision, dizziness. No urinary urgency since starting oxybutynin. No nausea, vomiting, diarrhea, constipation. No fever, chills.    Patient Active Problem List   Diagnosis Date Noted  . 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 Polyarthralgia 09/21/2016  . Trochanteric bursitis of right hip 09/21/2016  . Primary osteoarthritis of both hands 09/21/2016  . Rheumatoid factor positive 09/21/2016  . Primary osteoarthritis of right hip 06/02/2016  . Primary osteoarthritis of right knee 06/02/2016  . Urinary frequency 03/15/2016  . Pain in  joint, shoulder region 03/15/2016  . Degenerative disc disease, lumbar 10/13/2015  . 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 Diabetic retinopathy ([place]) 10/04/2015  . Nocturia 06/10/2014  . Cataract 09/25/2012  . OSA (obstructive sleep apnea) 08/10/2012  . Eczema 08/10/2012  . Vitamin D deficiency 08/10/2012  . Erectile dysfunction 08/10/2012  . Personal history of adenomatous colonic polyps 04/08/2012  . History of prostate cancer 01/04/2012  . Diabetes type 2, controlled ([place]) 10/25/2011  . HTN (hypertension) 10/25/2011  . Hyperlipidemia LDL goal <70 10/25/2011  . BMI 45.0-49.9, adult ([place]) 10/25/2011     Prior to Admission medications   Medication Sig Start Date End Date Taking? Authorizing Provider  aspirin 81 MG tablet Take 81 mg by mouth daily.   Yes [provider]  Blood Glucose Monitoring Suppl (ONE TOUCH ULTRA SYSTEM KIT) w/Device KIT Use to test blood sugar daily. 10/21/15  Yes Kathalina Ostermann, PA-C  lisinopril (PRINIVIL,ZESTRIL) 20 MG tablet Take 1 tablet (20 mg total) by mouth daily. 02/24/17  Yes Lavon Bothwell, PA-C  metFORMIN (GLUCOPHAGE) 1000 MG tablet TAKE 1 TABLET BY MOUTH TWICE DAILY WITH A MEAL 05/18/17  Yes Mikeila Burgen, PA-C  naproxen sodium (ALEVE) 220 MG tablet Take 220 mg by mouth daily as needed.    Yes [provider]  oxybutynin (DITROPAN) 5 MG tablet Take 1 tablet (5 mg total) by mouth 4 (four) times daily. 09/12/17  Yes Yoandri Congrove, PA-C  Vitamin D, Ergocalciferol, (DRISDOL) 50000 units CAPS capsule 50,000 Units  every 7 (seven) days. 08/08/17  Yes [provider]  HYDROcodone-acetaminophen (NORCO/VICODIN) 5-325 MG tablet Take 1 tablet by mouth every 6 (six) hours as needed for severe pain. 09/12/17 10/12/17  [name], PA-C  Misc. Devices (ROLLATOR) MISC Use with ambulation. Patient not taking: Reported on 08/31/2017 11/15/16   [name], PA-C  oxybutynin (DITROPAN) 5 MG tablet Take 1 tablet (5 mg  total) by mouth 2 (two) times daily. 06/16/11 08/01/12  [name], PA-C     No Known Allergies     Objective:  Physical Exam  Constitutional: He is oriented to person, place, and time. He appears well-developed and well-nourished. He is active and cooperative. No distress.  BP 120/78 (BP Location: Right Arm, Patient Position: Sitting, Cuff Size: Large)   Pulse 94   Temp 98.3 F (36.8 C) (Oral)   Resp 18   Ht '5\' 4"'  (1.626 m)   Wt 250 lb 9.6 oz (113.7 kg)   SpO2 97%   BMI 43.02 kg/m  Seated in a wheelchair.  HENT:  Head: Normocephalic and atraumatic.  Right Ear: Hearing normal.  Left Ear: Hearing normal.  Eyes: Conjunctivae are normal. No scleral icterus.  Neck: Normal range of motion. Neck supple. No thyromegaly present.  Cardiovascular: Normal rate, regular rhythm and normal heart sounds.  Pulses:      Radial pulses are 2+ on the right side, and 2+ on the left side.  Pulmonary/Chest: Effort normal and breath sounds normal.  Lymphadenopathy:       Head (right side): No tonsillar, no preauricular, no posterior auricular and no occipital adenopathy present.       Head (left side): No tonsillar, no preauricular, no posterior auricular and no occipital adenopathy present.    He has no cervical adenopathy.       Right: No supraclavicular adenopathy present.       Left: No supraclavicular adenopathy present.  Neurological: He is alert and oriented to person, place, and time. No sensory deficit.  Skin: Skin is warm, dry and intact. No rash noted. No cyanosis or erythema. Nails show no clubbing.  Psychiatric: He has a normal mood and affect. His speech is normal and behavior is normal.       Wt Readings from Last 3 Encounters:  09/12/17 250 lb 9.6 oz (113.7 kg)  08/31/17 254 lb (115.2 kg)  07/28/17 259 lb (117.5 kg)       Assessment & Plan:   Problem List Items Addressed This Visit    Diabetes type 2, controlled ([place]) - Primary (Chronic)    Await hemoglobin A1c.   Encouraged continued efforts for healthy eating.  Aquatic exercise could help, given his severe orthopedic/rheumatologic pain.      Relevant Medications   atorvastatin (LIPITOR) 10 MG tablet   Other Relevant Orders   Hemoglobin A1c (Completed)   Comprehensive metabolic panel (Completed)   HTN (hypertension) (Chronic)    Well-controlled.  No changes.      Relevant Medications   atorvastatin (LIPITOR) 10 MG tablet   Other Relevant Orders   Comprehensive metabolic panel (Completed)   Hyperlipidemia LDL goal <70 (Chronic)    Continue low-dose atorvastatin.      Relevant Medications   atorvastatin (LIPITOR) 10 MG tablet   Other Relevant Orders   Lipid panel (Completed)   Comprehensive metabolic panel (Completed)   OSA (obstructive sleep apnea) (Chronic)   Nocturia (Chronic)    Controlled with oxybutynin.  Continue.      Relevant Medications   oxybutynin (  DITROPAN) 5 MG tablet   BMI 45.0-49.9, adult ([place])    Congratulated on his continued efforts toward healthy lifestyle modifications and weight reduction.  He is now down 50 pounds since his maximum weight in 2013.      Primary osteoarthritis of right hip   Relevant Medications   HYDROcodone-acetaminophen (NORCO/VICODIN) 5-325 MG tablet   Primary osteoarthritis of right knee   Relevant Medications   HYDROcodone-acetaminophen (NORCO/VICODIN) 5-325 MG tablet   Polyarthritis with positive rheumatoid factor ([place])    He desires seeing a different specialist.  There is some confusion about which areas of pain are due to rheumatologic cause and which are due to osteoarthritis.      Relevant Medications   HYDROcodone-acetaminophen (NORCO/VICODIN) 5-325 MG tablet   Other Relevant Orders   Ambulatory referral to Rheumatology       Return in about 3 months (around 12/13/2017) for re-evaluation of diabetes, blood pressure, cholesterol.   Fara Chute, PA-C [facility] [place]

## 2017-09-13 ENCOUNTER — Telehealth (INDEPENDENT_AMBULATORY_CARE_PROVIDER_SITE_OTHER): Payer: Self-pay

## 2017-09-13 LAB — HEMOGLOBIN A1C
ESTIMATED AVERAGE GLUCOSE: 126 mg/dL
HEMOGLOBIN A1C: 6 % — AB (ref 4.8–5.6)

## 2017-09-13 LAB — COMPREHENSIVE METABOLIC PANEL
A/G RATIO: 1.4 (ref 1.2–2.2)
ALBUMIN: 4.4 g/dL (ref 3.6–4.8)
ALK PHOS: 82 IU/L (ref 39–117)
ALT: 13 IU/L (ref 0–44)
AST: 15 IU/L (ref 0–40)
BUN/Creatinine Ratio: 17 (ref 10–24)
BUN: 19 mg/dL (ref 8–27)
Bilirubin Total: 0.3 mg/dL (ref 0.0–1.2)
CO2: 21 mmol/L (ref 20–29)
Calcium: 10.2 mg/dL (ref 8.6–10.2)
Chloride: 101 mmol/L (ref 96–106)
Creatinine, Ser: 1.1 mg/dL (ref 0.76–1.27)
GFR calc Af Amer: 80 mL/min/{1.73_m2} (ref >59)
GFR, EST NON AFRICAN AMERICAN: 69 mL/min/{1.73_m2} (ref >59)
GLOBULIN, TOTAL: 3.1 g/dL (ref 1.5–4.5)
GLUCOSE: 91 mg/dL (ref 65–99)
Potassium: 5 mmol/L (ref 3.5–5.2)
SODIUM: 140 mmol/L (ref 134–144)
TOTAL PROTEIN: 7.5 g/dL (ref 6.0–8.5)

## 2017-09-13 LAB — LIPID PANEL
Chol/HDL Ratio: 3.4 ratio (ref 0.0–5.0)
Cholesterol, Total: 171 mg/dL (ref 100–199)
HDL: 50 mg/dL (ref >39)
LDL Calculated: 103 mg/dL — ABNORMAL HIGH (ref 0–99)
TRIGLYCERIDES: 88 mg/dL (ref 0–149)
VLDL CHOLESTEROL CAL: 18 mg/dL (ref 5–40)

## 2017-09-13 NOTE — Telephone Encounter (Signed)
Submitted application online for Euflexxa injection series, Right Knee.

## 2017-09-16 MED ORDER — ATORVASTATIN CALCIUM 10 MG PO TABS: 10.0000 mg | ORAL_TABLET | Freq: Every day | ORAL | 3 refills | Status: DC

## 2017-09-18 NOTE — Assessment & Plan Note (Signed)
He desires seeing a different specialist.  There is some confusion about which areas of pain are due to rheumatologic cause and which are due to osteoarthritis.

## 2017-09-18 NOTE — Assessment & Plan Note (Signed)
Controlled with oxybutynin.  Continue.

## 2017-09-18 NOTE — Assessment & Plan Note (Signed)
Well controlled. No changes. 

## 2017-09-18 NOTE — Assessment & Plan Note (Signed)
Continue low dose atorvastatin  

## 2017-09-18 NOTE — Assessment & Plan Note (Signed)
Congratulated on his continued efforts toward healthy lifestyle modifications and weight reduction.  He is now down 50 pounds since his maximum weight in 2013.

## 2017-09-18 NOTE — Assessment & Plan Note (Signed)
Await hemoglobin A1c.  Encouraged continued efforts for healthy eating.  Aquatic exercise could help, given his severe orthopedic/rheumatologic pain.

## 2017-09-27 DIAGNOSIS — R768 Other specified abnormal immunological findings in serum: Secondary | ICD-10-CM | POA: Diagnosis not present

## 2017-09-27 DIAGNOSIS — M1611 Unilateral primary osteoarthritis, right hip: Secondary | ICD-10-CM | POA: Diagnosis not present

## 2017-09-27 DIAGNOSIS — M25551 Pain in right hip: Secondary | ICD-10-CM | POA: Diagnosis not present

## 2017-09-27 DIAGNOSIS — M1711 Unilateral primary osteoarthritis, right knee: Secondary | ICD-10-CM | POA: Diagnosis not present

## 2017-09-27 DIAGNOSIS — M109 Gout, unspecified: Secondary | ICD-10-CM | POA: Diagnosis not present

## 2017-09-27 DIAGNOSIS — M25561 Pain in right knee: Secondary | ICD-10-CM | POA: Diagnosis not present

## 2017-10-04 ENCOUNTER — Encounter: Payer: Self-pay | Admitting: Physician Assistant

## 2017-10-12 DIAGNOSIS — R768 Other specified abnormal immunological findings in serum: Secondary | ICD-10-CM | POA: Diagnosis not present

## 2017-10-12 DIAGNOSIS — M1711 Unilateral primary osteoarthritis, right knee: Secondary | ICD-10-CM | POA: Diagnosis not present

## 2017-10-12 DIAGNOSIS — M25561 Pain in right knee: Secondary | ICD-10-CM | POA: Diagnosis not present

## 2017-10-12 DIAGNOSIS — M25512 Pain in left shoulder: Secondary | ICD-10-CM | POA: Diagnosis not present

## 2017-10-12 DIAGNOSIS — M109 Gout, unspecified: Secondary | ICD-10-CM | POA: Diagnosis not present

## 2017-10-12 DIAGNOSIS — M1611 Unilateral primary osteoarthritis, right hip: Secondary | ICD-10-CM | POA: Diagnosis not present

## 2017-10-12 DIAGNOSIS — M25551 Pain in right hip: Secondary | ICD-10-CM | POA: Diagnosis not present

## 2017-10-13 ENCOUNTER — Telehealth (INDEPENDENT_AMBULATORY_CARE_PROVIDER_SITE_OTHER): Payer: Self-pay

## 2017-10-13 NOTE — Telephone Encounter (Signed)
PA is required for Euflexxa([id number]).   PA forms being faxed today per [facility].

## 2017-10-16 ENCOUNTER — Telehealth: Payer: Self-pay | Admitting: Physician Assistant

## 2017-10-16 NOTE — Telephone Encounter (Signed)
Pt is scheduled with [facility] on 09/27/17 at 1PM

## 2017-11-07 ENCOUNTER — Telehealth (INDEPENDENT_AMBULATORY_CARE_PROVIDER_SITE_OTHER): Payer: Self-pay

## 2017-11-07 NOTE — Telephone Encounter (Signed)
Received PA , 11/07/2017. Completed and faxed PA form to [facility] at [phone].

## 2017-11-07 NOTE — Telephone Encounter (Signed)
Application for Euflexxa injection, right knee was canceled due to Euflexxa being a nonpreferred product.  Submitted application online for Synvisc/SynviscOne, right knee, per Abigail Butts M.on 11/03/17.  Received VOB for Synvisc, right knee PA is required for Synvisc

## 2017-11-13 ENCOUNTER — Telehealth (INDEPENDENT_AMBULATORY_CARE_PROVIDER_SITE_OTHER): Payer: Self-pay

## 2017-11-13 NOTE — Telephone Encounter (Signed)
Patient approved for Synvisc series (3), right knee. [place] [employer] at 100% PA approved- Approval# [id number] Valid 11/07/2017- 11/07/2018  Please call patient to schedule appointment. Thank You.

## 2017-11-24 ENCOUNTER — Telehealth: Payer: Self-pay | Admitting: Rheumatology

## 2017-11-24 NOTE — Telephone Encounter (Signed)
FYI: I spoke with patient in regards to scheduling Synvisc injections. Per patient he went to an Orthopedic doctor instead, and already had injections done. Patient canceled upcoming appt, and has decided to continue care with the Orthopedic instead.

## 2017-11-24 NOTE — Telephone Encounter (Signed)
Thank you for informing me.

## 2017-11-30 DIAGNOSIS — E119 Type 2 diabetes mellitus without complications: Secondary | ICD-10-CM | POA: Diagnosis not present

## 2017-11-30 DIAGNOSIS — M083 Juvenile rheumatoid polyarthritis (seronegative): Secondary | ICD-10-CM | POA: Diagnosis not present

## 2017-11-30 DIAGNOSIS — E13311 Other specified diabetes mellitus with unspecified diabetic retinopathy with macular edema: Secondary | ICD-10-CM | POA: Diagnosis not present

## 2017-11-30 DIAGNOSIS — R1011 Right upper quadrant pain: Secondary | ICD-10-CM | POA: Diagnosis not present

## 2017-11-30 DIAGNOSIS — I10 Essential (primary) hypertension: Secondary | ICD-10-CM | POA: Diagnosis not present

## 2017-11-30 DIAGNOSIS — Z136 Encounter for screening for cardiovascular disorders: Secondary | ICD-10-CM | POA: Diagnosis not present

## 2017-11-30 DIAGNOSIS — E785 Hyperlipidemia, unspecified: Secondary | ICD-10-CM | POA: Diagnosis not present

## 2017-11-30 DIAGNOSIS — Z6841 Body Mass Index (BMI) 40.0 and over, adult: Secondary | ICD-10-CM | POA: Diagnosis not present

## 2017-11-30 DIAGNOSIS — R350 Frequency of micturition: Secondary | ICD-10-CM | POA: Diagnosis not present

## 2017-11-30 DIAGNOSIS — M2550 Pain in unspecified joint: Secondary | ICD-10-CM | POA: Diagnosis not present

## 2017-12-05 ENCOUNTER — Other Ambulatory Visit: Payer: Self-pay | Admitting: Physician Assistant

## 2017-12-05 DIAGNOSIS — Z136 Encounter for screening for cardiovascular disorders: Secondary | ICD-10-CM

## 2017-12-21 ENCOUNTER — Ambulatory Visit
Admission: RE | Admit: 2017-12-21 | Discharge: 2017-12-21 | Disposition: A | Discharge status: Home / Self Care | Payer: Medicare Other | Source: Ambulatory Visit | Attending: Family Medicine | Admitting: Family Medicine

## 2017-12-21 DIAGNOSIS — Z136 Encounter for screening for cardiovascular disorders: Secondary | ICD-10-CM | POA: Diagnosis not present

## 2017-12-21 DIAGNOSIS — I714 Abdominal aortic aneurysm, without rupture: Secondary | ICD-10-CM | POA: Diagnosis not present

## 2018-01-09 DIAGNOSIS — Z7984 Long term (current) use of oral hypoglycemic drugs: Secondary | ICD-10-CM | POA: Diagnosis not present

## 2018-01-09 DIAGNOSIS — E119 Type 2 diabetes mellitus without complications: Secondary | ICD-10-CM | POA: Diagnosis not present

## 2018-01-09 DIAGNOSIS — H40013 Open angle with borderline findings, low risk, bilateral: Secondary | ICD-10-CM | POA: Diagnosis not present

## 2018-01-09 DIAGNOSIS — H2513 Age-related nuclear cataract, bilateral: Secondary | ICD-10-CM | POA: Diagnosis not present

## 2018-01-22 DIAGNOSIS — R1011 Right upper quadrant pain: Secondary | ICD-10-CM | POA: Diagnosis not present

## 2018-01-25 ENCOUNTER — Telehealth: Payer: Self-pay | Admitting: Family Medicine

## 2018-01-25 DIAGNOSIS — M109 Gout, unspecified: Secondary | ICD-10-CM | POA: Diagnosis not present

## 2018-01-25 DIAGNOSIS — M25551 Pain in right hip: Secondary | ICD-10-CM | POA: Diagnosis not present

## 2018-01-25 DIAGNOSIS — M25512 Pain in left shoulder: Secondary | ICD-10-CM | POA: Diagnosis not present

## 2018-01-25 DIAGNOSIS — M1611 Unilateral primary osteoarthritis, right hip: Secondary | ICD-10-CM | POA: Diagnosis not present

## 2018-01-25 DIAGNOSIS — R768 Other specified abnormal immunological findings in serum: Secondary | ICD-10-CM | POA: Diagnosis not present

## 2018-01-25 DIAGNOSIS — M25561 Pain in right knee: Secondary | ICD-10-CM | POA: Diagnosis not present

## 2018-01-25 DIAGNOSIS — M1711 Unilateral primary osteoarthritis, right knee: Secondary | ICD-10-CM | POA: Diagnosis not present

## 2018-01-25 NOTE — Telephone Encounter (Signed)
Copied from [place] [id number]. Topic: General - Other >> Jan 24, 2018  5:21 PM Valla Leaver wrote: Reason for CRM: Jackson Latino with [employer] [employer] calling regarding faxes sent for supplies for cpap machine on the 6th, 13th, and 18th of September. Spoke with Demi on 09/13, however no crm in chart. Patient was seeing Daphane Shepherd. Please advise.

## 2018-01-31 ENCOUNTER — Ambulatory Visit: Payer: Medicare Other | Admitting: Rheumatology

## 2018-02-05 DIAGNOSIS — Z79891 Long term (current) use of opiate analgesic: Secondary | ICD-10-CM | POA: Diagnosis not present

## 2018-02-05 DIAGNOSIS — M25559 Pain in unspecified hip: Secondary | ICD-10-CM | POA: Diagnosis not present

## 2018-02-05 DIAGNOSIS — Z79899 Other long term (current) drug therapy: Secondary | ICD-10-CM | POA: Diagnosis not present

## 2018-02-05 DIAGNOSIS — M545 Low back pain: Secondary | ICD-10-CM | POA: Diagnosis not present

## 2018-02-05 DIAGNOSIS — M79606 Pain in leg, unspecified: Secondary | ICD-10-CM | POA: Diagnosis not present

## 2018-02-05 DIAGNOSIS — G894 Chronic pain syndrome: Secondary | ICD-10-CM | POA: Diagnosis not present

## 2018-02-05 NOTE — Telephone Encounter (Signed)
[name] from [facility] called in to follow up on fax sent for cpap supply order.Please advise

## 2018-02-06 ENCOUNTER — Other Ambulatory Visit: Payer: Self-pay | Admitting: Physician Assistant

## 2018-02-06 ENCOUNTER — Ambulatory Visit
Admission: RE | Admit: 2018-02-06 | Discharge: 2018-02-06 | Disposition: A | Discharge status: Home / Self Care | Payer: Medicare Other | Source: Ambulatory Visit | Attending: Physician Assistant | Admitting: Physician Assistant

## 2018-02-06 DIAGNOSIS — M5116 Intervertebral disc disorders with radiculopathy, lumbar region: Secondary | ICD-10-CM | POA: Diagnosis not present

## 2018-02-06 DIAGNOSIS — M545 Low back pain, unspecified: Secondary | ICD-10-CM

## 2018-02-06 DIAGNOSIS — M79606 Pain in leg, unspecified: Secondary | ICD-10-CM

## 2018-02-07 NOTE — Telephone Encounter (Signed)
LMOVM for [facility] Pt is under the care of [facility] PA-C  [place] phone # and address for [facility].

## 2018-02-21 ENCOUNTER — Encounter: Payer: Self-pay | Admitting: Neurology

## 2018-02-22 DIAGNOSIS — G894 Chronic pain syndrome: Secondary | ICD-10-CM | POA: Diagnosis not present

## 2018-02-22 DIAGNOSIS — Z79899 Other long term (current) drug therapy: Secondary | ICD-10-CM | POA: Diagnosis not present

## 2018-02-22 DIAGNOSIS — Z79891 Long term (current) use of opiate analgesic: Secondary | ICD-10-CM | POA: Diagnosis not present

## 2018-02-22 DIAGNOSIS — M25559 Pain in unspecified hip: Secondary | ICD-10-CM | POA: Diagnosis not present

## 2018-03-05 DIAGNOSIS — E119 Type 2 diabetes mellitus without complications: Secondary | ICD-10-CM | POA: Diagnosis not present

## 2018-03-05 DIAGNOSIS — E785 Hyperlipidemia, unspecified: Secondary | ICD-10-CM | POA: Diagnosis not present

## 2018-03-05 DIAGNOSIS — R351 Nocturia: Secondary | ICD-10-CM | POA: Diagnosis not present

## 2018-03-05 DIAGNOSIS — E559 Vitamin D deficiency, unspecified: Secondary | ICD-10-CM | POA: Diagnosis not present

## 2018-03-05 DIAGNOSIS — I10 Essential (primary) hypertension: Secondary | ICD-10-CM | POA: Diagnosis not present

## 2018-03-05 DIAGNOSIS — Z6841 Body Mass Index (BMI) 40.0 and over, adult: Secondary | ICD-10-CM | POA: Diagnosis not present

## 2018-03-05 DIAGNOSIS — Z8546 Personal history of malignant neoplasm of prostate: Secondary | ICD-10-CM | POA: Diagnosis not present

## 2018-03-05 DIAGNOSIS — Z23 Encounter for immunization: Secondary | ICD-10-CM | POA: Diagnosis not present

## 2018-03-05 DIAGNOSIS — I714 Abdominal aortic aneurysm, without rupture: Secondary | ICD-10-CM | POA: Diagnosis not present

## 2018-03-05 DIAGNOSIS — Z125 Encounter for screening for malignant neoplasm of prostate: Secondary | ICD-10-CM | POA: Diagnosis not present

## 2018-03-06 DIAGNOSIS — G894 Chronic pain syndrome: Secondary | ICD-10-CM | POA: Diagnosis not present

## 2018-03-06 DIAGNOSIS — M25559 Pain in unspecified hip: Secondary | ICD-10-CM | POA: Diagnosis not present

## 2018-03-06 DIAGNOSIS — M25519 Pain in unspecified shoulder: Secondary | ICD-10-CM | POA: Diagnosis not present

## 2018-03-06 DIAGNOSIS — Z79891 Long term (current) use of opiate analgesic: Secondary | ICD-10-CM | POA: Diagnosis not present

## 2018-03-06 DIAGNOSIS — Z79899 Other long term (current) drug therapy: Secondary | ICD-10-CM | POA: Diagnosis not present

## 2018-03-06 DIAGNOSIS — M545 Low back pain: Secondary | ICD-10-CM | POA: Diagnosis not present

## 2018-03-07 ENCOUNTER — Other Ambulatory Visit: Payer: Self-pay | Admitting: Pain Medicine

## 2018-03-07 DIAGNOSIS — M545 Low back pain, unspecified: Secondary | ICD-10-CM

## 2018-03-08 ENCOUNTER — Other Ambulatory Visit: Payer: Self-pay | Admitting: Physician Assistant

## 2018-03-08 ENCOUNTER — Ambulatory Visit
Admission: RE | Admit: 2018-03-08 | Discharge: 2018-03-08 | Disposition: A | Discharge status: Home / Self Care | Payer: Medicare Other | Source: Ambulatory Visit | Attending: Physician Assistant | Admitting: Physician Assistant

## 2018-03-08 ENCOUNTER — Ambulatory Visit
Admission: RE | Admit: 2018-03-08 | Discharge: 2018-03-08 | Disposition: A | Discharge status: Home / Self Care | Payer: Medicare Other | Source: Ambulatory Visit | Attending: Pain Medicine | Admitting: Pain Medicine

## 2018-03-08 DIAGNOSIS — M25519 Pain in unspecified shoulder: Secondary | ICD-10-CM

## 2018-03-08 DIAGNOSIS — M48061 Spinal stenosis, lumbar region without neurogenic claudication: Secondary | ICD-10-CM | POA: Diagnosis not present

## 2018-03-08 DIAGNOSIS — M545 Low back pain, unspecified: Secondary | ICD-10-CM

## 2018-03-08 DIAGNOSIS — M25512 Pain in left shoulder: Secondary | ICD-10-CM | POA: Diagnosis not present

## 2018-03-20 DIAGNOSIS — M5417 Radiculopathy, lumbosacral region: Secondary | ICD-10-CM | POA: Diagnosis not present

## 2018-03-20 DIAGNOSIS — M79609 Pain in unspecified limb: Secondary | ICD-10-CM | POA: Diagnosis not present

## 2018-03-26 ENCOUNTER — Telehealth: Payer: Self-pay | Admitting: Adult Health

## 2018-03-26 NOTE — Telephone Encounter (Signed)
Jewls with [employer] requesting a call at [phone] to discuss the pt getting new CPAP supplies.

## 2018-03-28 NOTE — Telephone Encounter (Signed)
Unable to get in contact with Rodney Walsh with [facility]. Left a voicemail for them to give me a call back. Office number provided.

## 2018-03-29 NOTE — Telephone Encounter (Signed)
Patient doesn't use [facility] [employer] suppplies as his DME company. He uses [place] has been discarded.

## 2018-03-29 NOTE — Telephone Encounter (Signed)
Spoke with Jewls in regards to cpap paperwork for Mr. Rodney Walsh. I was unable to locate the paperwork that she faxed over on March 20, 2018. Jewl has agreed to refax the paperwork to pod [phone]).

## 2018-04-02 DIAGNOSIS — G894 Chronic pain syndrome: Secondary | ICD-10-CM | POA: Diagnosis not present

## 2018-04-02 DIAGNOSIS — M5136 Other intervertebral disc degeneration, lumbar region: Secondary | ICD-10-CM | POA: Diagnosis not present

## 2018-04-02 DIAGNOSIS — M545 Low back pain: Secondary | ICD-10-CM | POA: Diagnosis not present

## 2018-04-02 DIAGNOSIS — Z79899 Other long term (current) drug therapy: Secondary | ICD-10-CM | POA: Diagnosis not present

## 2018-04-02 DIAGNOSIS — Z79891 Long term (current) use of opiate analgesic: Secondary | ICD-10-CM | POA: Diagnosis not present

## 2018-04-02 DIAGNOSIS — M25559 Pain in unspecified hip: Secondary | ICD-10-CM | POA: Diagnosis not present

## 2018-04-02 DIAGNOSIS — M79606 Pain in leg, unspecified: Secondary | ICD-10-CM | POA: Diagnosis not present

## 2018-04-30 DIAGNOSIS — M5136 Other intervertebral disc degeneration, lumbar region: Secondary | ICD-10-CM | POA: Diagnosis not present

## 2018-06-04 DIAGNOSIS — M5136 Other intervertebral disc degeneration, lumbar region: Secondary | ICD-10-CM | POA: Diagnosis not present

## 2018-06-04 DIAGNOSIS — M1711 Unilateral primary osteoarthritis, right knee: Secondary | ICD-10-CM | POA: Diagnosis not present

## 2018-06-04 DIAGNOSIS — M25551 Pain in right hip: Secondary | ICD-10-CM | POA: Diagnosis not present

## 2018-06-04 DIAGNOSIS — M79651 Pain in right thigh: Secondary | ICD-10-CM | POA: Diagnosis not present

## 2018-06-05 DIAGNOSIS — E1159 Type 2 diabetes mellitus with other circulatory complications: Secondary | ICD-10-CM | POA: Diagnosis not present

## 2018-06-05 DIAGNOSIS — E785 Hyperlipidemia, unspecified: Secondary | ICD-10-CM | POA: Diagnosis not present

## 2018-06-05 DIAGNOSIS — M0580 Other rheumatoid arthritis with rheumatoid factor of unspecified site: Secondary | ICD-10-CM | POA: Diagnosis not present

## 2018-06-05 DIAGNOSIS — E1169 Type 2 diabetes mellitus with other specified complication: Secondary | ICD-10-CM | POA: Diagnosis not present

## 2018-06-05 DIAGNOSIS — I714 Abdominal aortic aneurysm, without rupture: Secondary | ICD-10-CM | POA: Diagnosis not present

## 2018-06-05 DIAGNOSIS — E13311 Other specified diabetes mellitus with unspecified diabetic retinopathy with macular edema: Secondary | ICD-10-CM | POA: Diagnosis not present

## 2018-06-05 DIAGNOSIS — Z0000 Encounter for general adult medical examination without abnormal findings: Secondary | ICD-10-CM | POA: Diagnosis not present

## 2018-06-05 DIAGNOSIS — E113393 Type 2 diabetes mellitus with moderate nonproliferative diabetic retinopathy without macular edema, bilateral: Secondary | ICD-10-CM | POA: Diagnosis not present

## 2018-06-05 DIAGNOSIS — I10 Essential (primary) hypertension: Secondary | ICD-10-CM | POA: Diagnosis not present

## 2018-06-05 DIAGNOSIS — E559 Vitamin D deficiency, unspecified: Secondary | ICD-10-CM | POA: Diagnosis not present

## 2018-06-05 DIAGNOSIS — Z6841 Body Mass Index (BMI) 40.0 and over, adult: Secondary | ICD-10-CM | POA: Diagnosis not present

## 2018-06-21 DIAGNOSIS — M1711 Unilateral primary osteoarthritis, right knee: Secondary | ICD-10-CM | POA: Diagnosis not present

## 2018-06-21 DIAGNOSIS — E875 Hyperkalemia: Secondary | ICD-10-CM | POA: Diagnosis not present

## 2018-06-21 DIAGNOSIS — G894 Chronic pain syndrome: Secondary | ICD-10-CM | POA: Diagnosis not present

## 2018-06-21 DIAGNOSIS — Z79891 Long term (current) use of opiate analgesic: Secondary | ICD-10-CM | POA: Diagnosis not present

## 2018-06-21 DIAGNOSIS — Z79899 Other long term (current) drug therapy: Secondary | ICD-10-CM | POA: Diagnosis not present

## 2018-07-19 DIAGNOSIS — M1711 Unilateral primary osteoarthritis, right knee: Secondary | ICD-10-CM | POA: Diagnosis not present

## 2018-07-19 DIAGNOSIS — G894 Chronic pain syndrome: Secondary | ICD-10-CM | POA: Diagnosis not present

## 2018-07-19 DIAGNOSIS — M25559 Pain in unspecified hip: Secondary | ICD-10-CM | POA: Diagnosis not present

## 2018-07-19 DIAGNOSIS — M79606 Pain in leg, unspecified: Secondary | ICD-10-CM | POA: Diagnosis not present

## 2018-08-09 DIAGNOSIS — M1711 Unilateral primary osteoarthritis, right knee: Secondary | ICD-10-CM | POA: Diagnosis not present

## 2018-08-09 DIAGNOSIS — M545 Low back pain: Secondary | ICD-10-CM | POA: Diagnosis not present

## 2018-08-09 DIAGNOSIS — G894 Chronic pain syndrome: Secondary | ICD-10-CM | POA: Diagnosis not present

## 2018-08-09 DIAGNOSIS — M25559 Pain in unspecified hip: Secondary | ICD-10-CM | POA: Diagnosis not present

## 2018-08-14 ENCOUNTER — Telehealth: Payer: Self-pay

## 2018-08-14 NOTE — Telephone Encounter (Signed)
Due to current COVID 19 pandemic, our office is severely reducing in office visits for at least the next 2 weeks, in order to minimize the risk to our patients and healthcare providers.   Megan, NP is also out on maternity leave on the day of pt's appt.  I called pt. He is agreeable to converting his appt to a virtual visit.  He does not know his email address, and will call me back in a few minutes to give me that. I will also need to discuss consent with him for this visit.  Pt reports that he is not using his cpap because he is in pain management right now and not doing well. The medications that he is now on cause him to be "dry" and he is unable to tolerate the cpap.

## 2018-08-14 NOTE — Addendum Note (Signed)
Addended by: Lester Castalia A on: 08/14/2018 11:59 AM   Modules accepted: Orders

## 2018-08-14 NOTE — Telephone Encounter (Signed)
I called pt, updated his meds, allergies, and PMH. I explained again to pt how the webex meeting will work. He understands that we will file with his insurance and he may have a patient responsible charge. He will download the app prior to his appt on Wed and then refer back to the email to hit the "join meeting" button a few minutes prior to his appt.  Pt reports that his weight is 232 lb and he is 5'4.

## 2018-08-14 NOTE — Telephone Encounter (Signed)
Pt and his wife have called back in.  Pt gave verbal consent to file insurance for virtual visit.  Pt provided his insurance as [email or web address]. Phone rep offered the information needed for the virtual visit, both pt and his wife declined.  Pt  And wife asked that RN Cyril Mourning have the information emailed to them.

## 2018-08-22 ENCOUNTER — Encounter: Payer: Self-pay | Admitting: Neurology

## 2018-08-22 ENCOUNTER — Other Ambulatory Visit: Payer: Self-pay

## 2018-08-22 ENCOUNTER — Ambulatory Visit (INDEPENDENT_AMBULATORY_CARE_PROVIDER_SITE_OTHER): Payer: Medicare Other | Admitting: Neurology

## 2018-08-22 DIAGNOSIS — Z789 Other specified health status: Secondary | ICD-10-CM | POA: Diagnosis not present

## 2018-08-22 DIAGNOSIS — G4733 Obstructive sleep apnea (adult) (pediatric): Secondary | ICD-10-CM

## 2018-08-22 NOTE — Patient Instructions (Signed)
Given over the phone during today's phone call virtual visit.  

## 2018-08-22 NOTE — Progress Notes (Signed)
Interim history:  Rodney Walsh is a 68 year old right-handed gentleman with an underlying medical history of joint pain, degenerative disc disease, diabetes with retinopathy, nocturia, urinary frequency, cataracts, eczema, vitamin D deficiency, ED, prostate cancer, hypertension, hyperlipidemia, and morbid obesity), with whom I am conducting a virtual, phone based visit ([employer] did not work for him today), in lieu of a face-to-face visit for follow-up consultation of his obstructive sleep apnea. The patient is accompanied by his wife today and joins via phone from home. I last saw him on 02/28/2017, at which time he was not fully compliant with his CPAP machine.   He saw Ward Givens, nurse practitioner in the interim on 08/31/2017, at which time he was compliant with his CPAP machine and advised to follow-up in one year.  Today, 08/22/2018: Please also see below for virtual visit documentation.   I reviewed his CPAP compliance data for the past 30 days, he has not been using his machine. The last days or usage was detected was 02/21/2018. In the past 90 days prior to that he used his machine altogether 2 days indicating noncompliance.   The patient's allergies, current medications, family history, past medical history, past social history, past surgical history and problem list were reviewed and updated as appropriate.    Previously (copied from previous notes for reference):    I first met him on 06/09/2016 at the request of his primary care provider, at which time he reported a prior diagnosis of OSA. He had not used CPAP in about 5 years. He needed re-evaluation and a new machine. He had a baseline sleep study, followed by a CPAP titration study. I went over his test results with him in detail today. Baseline sleep study from 06/19/2016 showed a sleep latency delayed at 83.5 minutes and REM latency was prolonged at 207 minutes, sleep efficiency reduced at 69.4%. He had absence of slow-wave  sleep and REM sleep was 16.8, he had increase in stage II sleep. Total AHI was 16.4 per hour, REM AHI 47 per hour, supine AHI 16.1 per hour, average oxygen saturation 95%, nadir was 72%. He had no significant PLMS. He was advised to return for a CPAP titration study. He had this on 07/07/2016, sleep efficiency was 87.2%, sleep latency 9 minutes, REM latency 215 minutes. He was fitted with a medium fullface mask and titrated from 5 cm to 15 cm. On the final pressure his AHI was 2.9 per hour, supine REM sleep was achieved, O2 nadir of 91%. Based on his test results I prescribed CPAP therapy for home use. He was unable to get his CPAP machine until recently due to financial reasons.   I reviewed his CPAP compliance data from 01/28/2017 through 02/26/2017 which is a total of 30 days, during which time he used his machine 22 days with percent used days greater than 4 hours at 36.7%, indicating low compliance with an average usage for days on treatment of only 4 hours and 2 minutes, residual AHI at goal at 2.8 per hour, leak at times high, pressure at 15 cm. Of note, in the month of August through September, 12/14/2016 through 01/12/2017 he had a compliance percentage of 100% for greater than 4 hours usage, averaging 7 hours and 10 min. Leak was high at the time. AHI 2.3 per hour on a pressure of 15 cm.    06/09/2016:   (He) was previously diagnosed with obstructive sleep apnea several years ago and place on CPAP therapy. Prior sleep study results  are not available for my review today. A CPAP compliance download is not available for my review today. I reviewed your office note from 03/15/2016. He was recently seen by rheumatology on 06/02/2016. His Epworth sleepiness score is 7 out of 24 today, his fatigue score is 39 out of 63. He reports that he had a sleep study about 15 years ago. He is retired. He lives with his wife. He has 5 grown children. He quit smoking in 2008 and does not currently drink any alcohol. He  drinks 2 cups of coffee per day.  He had a recent gout flare, currently on colchicine. Had recent hip injection and it may have helped. Has had prior injections to L knee and R knee. All five kids are in [place], 9 GC. Retired Recruitment consultant, city bus and school bus. Wife cannot sleep in the same BR, d/t snoring and apneas. He has frequent nocturia, about 3-4 per night. No AM HAs. No Sx of neuropathy.  He has not used his CPAP for about 5 years, needs supplies and is a mouth breather. He had a ResMed Ultra Mirage FFM L, and a chinstrap. Has an old RedMed Compact CPAP machine. His bedtime is between 11 PM and midnight, wakeup time varies.   His Past Medical History Is Significant For: Past Medical History:  Diagnosis Date   Arthritis    left shoulder and left knee   Arthritis    r hip, r knee   Diabetes mellitus without complication ([place])    Hyperlipidemia    Hypertension    Nephrolithiasis    OSA on CPAP    Prostate cancer ([place])     His Past Surgical History Is Significant For: Past Surgical History:  Procedure Laterality Date   BACK SURGERY     COLONOSCOPY N/A 07/17/2012   Procedure: COLONOSCOPY;  Surgeon: Gatha Mayer, MD;  Location: [facility];  Service: Endoscopy;  Laterality: N/A;   PROSTATE SURGERY     with seed placement    His Family History Is Significant For: Family History  Problem Relation Age of Onset   Cancer Mother        ovarian   Alzheimer's disease Father    Hypertension Sister    Diabetes Brother    Renal Disease Brother    Heart disease Brother    Diabetes Daughter    Seizures Daughter    Cancer Sister        ?uterine   Hypertension Sister    Diabetes Sister    Hyperlipidemia Sister    Hypertension Sister    Alcohol abuse Son    Drug abuse Son        heroin, prescription opiates   Alcohol abuse Son    Colon cancer Neg Hx     His Social History Is Significant For: Social History   Socioeconomic History   Marital  status: Married    Spouse name: Deb   Number of children: 5   Years of education: 12   Highest education level: High school graduate  Occupational History   Occupation: Designer, television/film set DRIVER    Employer: [employer]: Retired   Scientist, product/process development strain: Not very hard   Food insecurity:    Worry: Never true    Inability: Never true   Transportation needs:    Medical: No    Non-medical: No  Tobacco Use   Smoking status: Former Smoker    Packs/day: 1.00  Years: 45.00    Pack years: 45.00    Types: Cigarettes   Smokeless tobacco: Never Used   Tobacco comment: Quit in 2008  Substance and Sexual Activity   Alcohol use: No   Drug use: Never   Sexual activity: Yes    Partners: Female  Lifestyle   Physical activity:    Days per week: 0 days    Minutes per session: 0 min   Stress: Rather much  Relationships   Social connections:    Talks on phone: More than three times a week    Gets together: More than three times a week    Attends religious service: More than 4 times per year    Active member of club or organization: No    Attends meetings of clubs or organizations: Never    Relationship status: Married  Other Topics Concern   Not on file  Social History Narrative   Lives with his wife and their youngest son Paramedic. Has four sons and one daughter. Has 9 grandchildren.      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    Drinks 2 cups of coffee a day     His Allergies Are:  No Known Allergies:   His Current Medications Are:  Outpatient Encounter Medications as of 08/22/2018  Medication Sig   aspirin 81 MG tablet Take 81 mg by mouth daily.   Blood Glucose Monitoring Suppl (ONE TOUCH ULTRA SYSTEM KIT) w/Device KIT Use to test blood sugar daily.   diclofenac sodium (VOLTAREN) 1 % GEL Apply topically 4 (four) times daily.   HYDROcodone-acetaminophen (NORCO) 10-325 MG tablet Take 1 tablet by mouth 3 (three) times daily as needed.    lisinopril (PRINIVIL,ZESTRIL) 20 MG tablet Take 1 tablet (20 mg total) by mouth daily.   metFORMIN (GLUCOPHAGE) 1000 MG tablet TAKE 1 TABLET BY MOUTH TWICE DAILY WITH A MEAL (Patient taking differently: Take 1,000 mg by mouth daily with breakfast. )   Misc. Devices (ROLLATOR) MISC Use with ambulation.   naproxen sodium (ALEVE) 220 MG tablet Take 220 mg by mouth daily as needed.    Vitamin D, Ergocalciferol, (DRISDOL) 50000 units CAPS capsule 50,000 Units every 7 (seven) days.   No facility-administered encounter medications on file as of 08/22/2018.   :  Review of Systems:  Out of a complete 14 point review of systems, all are reviewed and negative with the exception of these symptoms as listed below:  Virtual Visit via Phone Note on 08/22/18: I connected with Rodney Walsh on 08/22/18 at  1:00 PM EDT by a phone and verified that I am speaking with the correct person using two identifiers.   I discussed the limitations of evaluation and management by telemedicine and the availability of in person appointments. The patient expressed understanding and agreed to proceed.  History of Present Illness: He reports that he is currently unable to use his CPAP machine because of pain medication which causes him significant mouth dryness and he is unable to tolerate CPAP. He is on hydrocodone 10/325 mg strength one pill 3 times a day. He is followed by pain management, has an appointment next month. He is hoping to further reduce his pain medication, currently his pain generally lingers around 6 out of 10, previously he reports that it was above 10 and severe. He has right hip pain, he has low back pain with radiation to the right. He may need right hip replacement surgery he reports. He also reports that he was not able to  afford CPAP supplies. He stopped the supplies shipments. He owns his machine at this time but will need ongoing supplies, he is willing to get back on the CPAP machine when he is off the pain  medication.   Observations/Objective: There are no recent vital signs available for my review, the most recent viewable vital signs are from 09/12/2017.    Assessment and Plan: In summary, Rodney Walsh a very pleasant 68 year old male with an underlying medical history of joint pain, degenerative disc disease, on narcotic pain medication, diabetes with retinopathy, nocturia, urinary frequency, cataracts, eczema, vitamin D deficiency, ED, prostate cancer, hypertension, hyperlipidemia, and morbid obesity, who presents for a virtual, phone based follow up consultation of his moderate to severe obstructive sleep apnea. He has a prior Dx from several years ago, and had not bee on CPAP for about 5 years when I first met him. He started CPAP therapy in early August 2018 and was initially fully compliant and then more intermittently compliant. He has not been using CPAP in the past few months, He estimates for the past 6 months. He is currently on hydrocodone, has reduced his pain medication, currently on 1 pill 3 times a day. Has an appointment next month. He anticipates that he can come off of the medication. He has had significant mouth dryness with the pain medication and is not able to tolerate CPAP at this time. He is willing to get back on it when possible. He is agreeable for a follow-up appointment in 6 months with Ward Givens, nurse practitioner.Of note, he had sleep study testing in February and March 2018. I answered all his questions today and the patient was in agreement.   Follow Up Instructions: 1. Try to use CPAP regularly, when possible.  2. He agrees to restart using CPAP when he can tolerate it. He anticipates that he will gradually be able to come off of his hydrocodone. He has already reduced it, has a follow-up appointment next month. 3. He may need hip replacement surgery for the right hip. 4. He is agreeable for a six-month follow-up with the nurse practitioner, Wayne Sever will call if he needs to be seen sooner. 5. Call or email through My Chart for any interim questions or concerns.   I discussed the assessment and treatment plan with the patient. The patient was provided an opportunity to ask questions and all were answered. The patient agreed with the plan and demonstrated an understanding of the instructions.   The patient was advised to call back or seek an in-person evaluation if the symptoms worsen or if the condition fails to improve as anticipated.  I provided 11 minutes of non-face-to-face time during this encounter.   Star Age, MD

## 2018-09-03 ENCOUNTER — Ambulatory Visit: Payer: Medicare Other | Admitting: Adult Health

## 2018-09-06 DIAGNOSIS — E785 Hyperlipidemia, unspecified: Secondary | ICD-10-CM | POA: Diagnosis not present

## 2018-09-06 DIAGNOSIS — E1159 Type 2 diabetes mellitus with other circulatory complications: Secondary | ICD-10-CM | POA: Diagnosis not present

## 2018-09-06 DIAGNOSIS — I10 Essential (primary) hypertension: Secondary | ICD-10-CM | POA: Diagnosis not present

## 2018-09-06 DIAGNOSIS — E1169 Type 2 diabetes mellitus with other specified complication: Secondary | ICD-10-CM | POA: Diagnosis not present

## 2018-09-11 DIAGNOSIS — I10 Essential (primary) hypertension: Secondary | ICD-10-CM | POA: Diagnosis not present

## 2018-09-11 DIAGNOSIS — E1169 Type 2 diabetes mellitus with other specified complication: Secondary | ICD-10-CM | POA: Diagnosis not present

## 2018-09-11 DIAGNOSIS — E1159 Type 2 diabetes mellitus with other circulatory complications: Secondary | ICD-10-CM | POA: Diagnosis not present

## 2018-09-11 DIAGNOSIS — E785 Hyperlipidemia, unspecified: Secondary | ICD-10-CM | POA: Diagnosis not present

## 2018-09-13 DIAGNOSIS — Z79891 Long term (current) use of opiate analgesic: Secondary | ICD-10-CM | POA: Diagnosis not present

## 2018-09-13 DIAGNOSIS — Z79899 Other long term (current) drug therapy: Secondary | ICD-10-CM | POA: Diagnosis not present

## 2018-09-13 DIAGNOSIS — M25519 Pain in unspecified shoulder: Secondary | ICD-10-CM | POA: Diagnosis not present

## 2018-09-13 DIAGNOSIS — M1711 Unilateral primary osteoarthritis, right knee: Secondary | ICD-10-CM | POA: Diagnosis not present

## 2018-09-13 DIAGNOSIS — G894 Chronic pain syndrome: Secondary | ICD-10-CM | POA: Diagnosis not present

## 2018-09-13 DIAGNOSIS — M25559 Pain in unspecified hip: Secondary | ICD-10-CM | POA: Diagnosis not present

## 2018-11-06 DIAGNOSIS — M25519 Pain in unspecified shoulder: Secondary | ICD-10-CM | POA: Diagnosis not present

## 2018-11-06 DIAGNOSIS — M1711 Unilateral primary osteoarthritis, right knee: Secondary | ICD-10-CM | POA: Diagnosis not present

## 2018-11-06 DIAGNOSIS — M25559 Pain in unspecified hip: Secondary | ICD-10-CM | POA: Diagnosis not present

## 2018-11-06 DIAGNOSIS — G894 Chronic pain syndrome: Secondary | ICD-10-CM | POA: Diagnosis not present

## 2019-09-02 ENCOUNTER — Other Ambulatory Visit: Payer: Self-pay

## 2019-09-02 ENCOUNTER — Encounter: Payer: Self-pay | Admitting: Neurology

## 2019-09-02 ENCOUNTER — Ambulatory Visit (INDEPENDENT_AMBULATORY_CARE_PROVIDER_SITE_OTHER): Payer: Medicare Other | Admitting: Neurology

## 2019-09-02 VITALS — BP 132/84 | HR 74 | Temp 97.4°F | Ht 64.0 in | Wt 238.3 lb

## 2019-09-02 DIAGNOSIS — G4733 Obstructive sleep apnea (adult) (pediatric): Secondary | ICD-10-CM

## 2019-09-02 NOTE — Patient Instructions (Signed)
Please talk to your DME provider, [facility] about your supplies and the cost of the supplies for your CPAP machine.  You may be able to tolerate a different mask better.  Please let me know if you would like to get a prescription for a different mask.  Please get back on the machine consistently as you have been untreated for your severe obstructive sleep apnea.  Please follow-up routinely to see one of our nurse practitioners in 3 months, either [facility] or [facility].

## 2019-09-02 NOTE — Progress Notes (Signed)
Subjective:    Patient ID: Rodney Walsh is a 69 y.o. male.  HPI     Interim history:   Rodney Walsh is a 69 year old right-handed gentleman with an underlying medical history of joint pain, degenerative disc disease, diabetes with retinopathy, nocturia, urinary frequency, cataracts, eczema, vitamin D deficiency, ED, prostate cancer, hypertension, hyperlipidemia, and morbid obesity), who presents for follow-up consultation of his obstructive sleep apnea.  The patient is unaccompanied today and is re-referred by [name], PA.  I last saw Rodney Walsh on 08/14/2018 in a virtual visit, at which time Rodney Walsh was not compliant with his CPAP.  Rodney Walsh was encouraged to restart using it.  Rodney Walsh reported difficulty using his CPAP secondary to severe mouth dryness from taking pain medication. Rodney Walsh was in the process of weaning off of hydrocodone.  Today, 09/02/2019: Rodney Walsh reports discomfort with his current mask.  Rodney Walsh would like to hold off on a mask refit appointment with his DME company because they keep sending Rodney Walsh bills and Rodney Walsh cannot afford his supplies at this time.  Rodney Walsh would be willing to get back on CPAP therapy and talk to them about the cost of his supplies.  Rodney Walsh would like to hold off on a prescription for supplies or mask refit appointment through his DME company, would like to give them a call himself.  Rodney Walsh has been off of narcotic pain medication.  Rodney Walsh still has arthritis pain.  Rodney Walsh uses a rolling walker.  Rodney Walsh uses distilled water in the machine.  I reviewed compliance data from last year, in the month of August through September 2020 Rodney Walsh used his machine once.  The patient's allergies, current medications, family history, past medical history, past social history, past surgical history and problem list were reviewed and updated as appropriate.    Previously (copied from previous notes for reference):    I saw Rodney Walsh on 02/28/2017, at which time Rodney Walsh was not fully compliant with his CPAP machine.    Rodney Walsh saw Ward Givens,  nurse practitioner in the interim on 08/31/2017, at which time Rodney Walsh was compliant with his CPAP machine and advised to follow-up in one year.    I reviewed his CPAP compliance data for the past 30 days, Rodney Walsh has not been using his machine. The last days or usage was detected was 02/21/2018. In the past 90 days prior to that Rodney Walsh used his machine altogether 2 days indicating noncompliance.        I first met Rodney Walsh on 06/09/2016 at the request of his primary care provider, at which time Rodney Walsh reported a prior diagnosis of OSA. Rodney Walsh had not used CPAP in about 5 years. Rodney Walsh needed re-evaluation and a new machine. Rodney Walsh had a baseline sleep study, followed by a CPAP titration study. I went over his test results with Rodney Walsh in detail today. Baseline sleep study from 06/19/2016 showed a sleep latency delayed at 83.5 minutes and REM latency was prolonged at 207 minutes, sleep efficiency reduced at 69.4%. Rodney Walsh had absence of slow-wave sleep and REM sleep was 16.8, Rodney Walsh had increase in stage II sleep. Total AHI was 16.4 per hour, REM AHI 47 per hour, supine AHI 16.1 per hour, average oxygen saturation 95%, nadir was 72%. Rodney Walsh had no significant PLMS. Rodney Walsh was advised to return for a CPAP titration study. Rodney Walsh had this on 07/07/2016, sleep efficiency was 87.2%, sleep latency 9 minutes, REM latency 215 minutes. Rodney Walsh was fitted with a medium fullface mask and titrated from 5 cm to 15 cm. On  the final pressure his AHI was 2.9 per hour, supine REM sleep was achieved, O2 nadir of 91%. Based on his test results I prescribed CPAP therapy for home use. Rodney Walsh was unable to get his CPAP machine until recently due to financial reasons.   I reviewed his CPAP compliance data from 01/28/2017 through 02/26/2017 which is a total of 30 days, during which time Rodney Walsh used his machine 22 days with percent used days greater than 4 hours at 36.7%, indicating low compliance with an average usage for days on treatment of only 4 hours and 2 minutes, residual AHI at goal at 2.8 per  hour, leak at times high, pressure at 15 cm. Of note, in the month of August through September, 12/14/2016 through 01/12/2017 Rodney Walsh had a compliance percentage of 100% for greater than 4 hours usage, averaging 7 hours and 10 min. Leak was high at the time. AHI 2.3 per hour on a pressure of 15 cm.    06/09/2016:   (Rodney Walsh) was previously diagnosed with obstructive sleep apnea several years ago and place on CPAP therapy. Prior sleep study results are not available for my review today. A CPAP compliance download is not available for my review today. I reviewed your office note from 03/15/2016. Rodney Walsh was recently seen by rheumatology on 06/02/2016. His Epworth sleepiness score is 7 out of 24 today, his fatigue score is 39 out of 63. Rodney Walsh reports that Rodney Walsh had a sleep study about 15 years ago. Rodney Walsh is retired. Rodney Walsh lives with his wife. Rodney Walsh has 5 grown children. Rodney Walsh quit smoking in 2008 and does not currently drink any alcohol. Rodney Walsh drinks 2 cups of coffee per day.  Rodney Walsh had a recent gout flare, currently on colchicine. Had recent hip injection and it may have helped. Has had prior injections to L knee and R knee. All five kids are in [place], 9 GC. Retired Recruitment consultant, city bus and school bus. Wife cannot sleep in the same BR, d/t snoring and apneas. Rodney Walsh has frequent nocturia, about 3-4 per night. No AM HAs. No Sx of neuropathy.  Rodney Walsh has not used his CPAP for about 5 years, needs supplies and is a mouth breather. Rodney Walsh had a ResMed Ultra Mirage FFM L, and a chinstrap. Has an old RedMed Compact CPAP machine. His bedtime is between 11 PM and midnight, wakeup time varies.   His Past Medical History Is Significant For: Past Medical History:  Diagnosis Date  . Arthritis    left shoulder and left knee  . Arthritis    r hip, r knee  . Diabetes mellitus without complication ([place])   . Hyperlipidemia   . Hypertension   . Nephrolithiasis   . OSA on CPAP   . Prostate cancer [facility])     His Past Surgical History Is Significant For: Past Surgical  History:  Procedure Laterality Date  . BACK SURGERY    . COLONOSCOPY N/A 07/17/2012   Procedure: COLONOSCOPY;  Surgeon: Gatha Mayer, MD;  Location: [facility] END[facility]COPY;  Service: Endoscopy;  Laterality: N/A;  . PROSTATE SURGERY     with seed placement    His Family History Is Significant For: Family History  Problem Relation Age of Onset  . Cancer Mother        ovarian  . Alzheimer's disease Father   . Hypertension Sister   . Diabetes Brother   . Renal Disease Brother   . Heart disease Brother   . Diabetes Daughter   . Seizures Daughter   .  Cancer Sister        ?uterine  . Hypertension Sister   . Diabetes Sister   . Hyperlipidemia Sister   . Hypertension Sister   . Alcohol abuse Son   . Drug abuse Son        heroin, prescription opiates  . Alcohol abuse Son   . Colon cancer Neg Hx     His Social History Is Significant For: Social History   Socioeconomic History  . Marital status: Married    Spouse name: Deb  . Number of children: 5  . Years of education: 34  . Highest education level: High school graduate  Occupational History  . Occupation: BUS DRIVER    Employer: [employer]: Retired   Tobacco Use  . Smoking status: Former Smoker    Packs/day: 1.00    Years: 45.00    Pack years: 45.00    Types: Cigarettes  . Smokeless tobacco: Never Used  . Tobacco comment: Quit in 2008  Substance and Sexual Activity  . Alcohol use: No  . Drug use: Never  . Sexual activity: Yes    Partners: Female  Other Topics Concern  . Not on file  Social History Narrative   Lives with his wife and their youngest son Paramedic. Has four sons and one daughter. Has 9 grandchildren.      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    Drinks 2 cups of coffee a day    Social Determinants of Radio broadcast assistant Strain:   . Difficulty of Paying Living Expenses:   Food Insecurity:   . Worried About Charity fundraiser in the Last Year:   . Arboriculturist in the Last Year:    Transportation Needs:   . Film/video editor (Medical):   Marland Kitchen Lack of Transportation (Non-Medical):   Physical Activity:   . Days of Exercise per Week:   . Minutes of Exercise per Session:   Stress:   . Feeling of Stress :   Social Connections:   . Frequency of Communication with Friends and Family:   . Frequency of Social Gatherings with Friends and Family:   . Attends Religious Services:   . Active Member of Clubs or Organizations:   . Attends Archivist Meetings:   Marland Kitchen Marital Status:     His Allergies Are:  No Known Allergies:   His Current Medications Are:  Outpatient Encounter Medications as of 09/02/2019  Medication Sig  . aspirin 81 MG tablet Take 81 mg by mouth daily.  Marland Kitchen atorvastatin (LIPITOR) 10 MG tablet SMARTSIG:1 Tablet(s) By Mouth Every Evening  . Blood Glucose Monitoring Suppl (ONE TOUCH ULTRA SYSTEM KIT) w/Device KIT Use to test blood sugar daily.  . diclofenac sodium (VOLTAREN) 1 % GEL Apply topically 4 (four) times daily.  Marland Kitchen HYDROcodone-acetaminophen (NORCO) 10-325 MG tablet Take 1 tablet by mouth 3 (three) times daily as needed.  Marland Kitchen lisinopril (PRINIVIL,ZESTRIL) 20 MG tablet Take 1 tablet (20 mg total) by mouth daily.  . metFORMIN (GLUCOPHAGE) 1000 MG tablet TAKE 1 TABLET BY MOUTH TWICE DAILY WITH A MEAL (Patient taking differently: Take 1,000 mg by mouth daily with breakfast. )  . Misc. Devices (ROLLATOR) MISC Use with ambulation.  . naproxen sodium (ALEVE) 220 MG tablet Take 220 mg by mouth daily as needed.   Marland Kitchen oxybutynin (DITROPAN) 5 MG tablet Take 10 mg by mouth 2 (two) times daily.  . Vitamin D, Ergocalciferol, (DRISDOL) 50000 units  CAPS capsule 50,000 Units every 7 (seven) days.   No facility-administered encounter medications on file as of 09/02/2019.  :  Review of Systems:  Out of a complete 14 point review of systems, all are reviewed and negative with the exception of these symptoms as listed below: Review of Systems  Neurological:        Last visit was in 2020. Pt reports Rodney Walsh is here to discuss his cpap. Rodney Walsh sts Rodney Walsh has not used his machine in over a year due to supplies being too expensive and nose and mouth being dry when Rodney Walsh used it in the past .   Epworth Sleepiness Scale 0= would never doze 1= slight chance of dozing 2= moderate chance of dozing 3= high chance of dozing  Sitting and reading:1 Watching TV:0 Sitting inactive in a public place (ex. Theater or meeting):1 As a passenger in a car for an hour without a break:0 Lying down to rest in the afternoon:0 Sitting and talking to someone:0 Sitting quietly after lunch (no alcohol):2 In a car, while stopped in traffic:0 Total:     Objective:  Neurological Exam  Physical Exam Physical Examination:   Vitals:   09/02/19 1202  BP: 132/84  Pulse: 74  Temp: (!) 97.4 F (36.3 C)    General Examination: The patient is a very pleasant 69 y.o. male in no acute distress. Rodney Walsh appears well-developed and well-nourished and well groomed.   HEENT:Normocephalic, atraumatic, pupils are equal, round and reactive to light, corrective eye glasses in place. Extraocular tracking is good without limitation to gaze excursion or nystagmus noted. Normal smooth pursuit is noted. Hearing is grossly intact. Face is symmetric with normal facial animation and normal facial sensation. Speech is clear with no dysarthria noted. There is no hypophonia. Oropharynx exam reveals: mild mouth dryness. Rodney Walsh has moderate airway crowding. Tongue protrudes centrally and palate elevates symmetrically.  Chest:Clear to auscultation without wheezing, rhonchi or crackles noted.  Heart:S1+S2+0, regular and normal without murmurs, rubs or gallops noted.   Abdomen:Soft, non-tender and non-distended with normal bowel sounds appreciated on auscultation.  Extremities:There is no pitting edema in the distal lower extremities bilaterally.   Skin: Warm and dry without trophic changes noted. There are no  varicose veins.  Musculoskeletal: exam reveals pain in R hip and knee, and L shoulder.   Neurologically:  Mental status: The patient is awake, alert and oriented in all 4 spheres. Hisimmediate and remote memory, attention, language skills and fund of knowledge are appropriate. There is no evidence of aphasia, agnosia, apraxia or anomia. Speech is clear with normal prosody and enunciation. Thought process is linear. Mood is normaland affect is normal.  Cranial nerves II - XII are as described above under HEENT exam.  Motor exam: Normal bulk, strength and tone is noted. There is no drift, tremor or rebound. Romberg is not tested for safety. Fine motor skills and coordination: grossly intact.  Cerebellar testing: No dysmetria or intention tremor on finger to nose testing.   Sensory exam: intact to light touch.  Gait, station and balance: Hestandswith difficulty, has to push himself up, walks with a 4 wheeled walker.  Assessment and Plan:  In summary, Rodney Walsh a very pleasant 69 year old male with an underlying medical history of joint pain, degenerative disc disease, diabetes with retinopathy, nocturia, urinary frequency, cataracts, eczema, vitamin D deficiency, ED, prostate cancer, hypertension, hyperlipidemia, and morbid obesity who presents for follow up consultation of OSA. Rodney Walsh had a prior Dx of OSA several years  ago, but was no longer on CPAP for about 5 years, when I met Rodney Walsh in 2018. Rodney Walsh had sleep study testing in 2018, Rodney Walsh was compliant with treatment in 2018 and 2019 but since then has not been using his CPAP regularly.  Rodney Walsh declines a order for updating his CPAP supplies, Rodney Walsh also declines a request for a mask fit appointment, Rodney Walsh wants to call his DME company and straighten out the payment options for his supplies as they are too costly for Rodney Walsh.  Rodney Walsh is advised to try to get back on CPAP therapy consistently.  Rodney Walsh is advised to call our office if Rodney Walsh needs any prescription for a  different mask or supplies.  Rodney Walsh is advised to follow-up routinely to see the nurse practitioner in 3 months, sooner if needed.  I answered all his questions today and Rodney Walsh was in agreement. I spent 20 minutes in total face-to-face time and in reviewing records during pre-charting, more than 50% of which was spent in counseling and coordination of care, reviewing test results, reviewing medications and treatment regimen and/or in discussing or reviewing the diagnosis of OSA, the prognosis and treatment options. Pertinent laboratory and imaging test results that were available during this visit with the patient were reviewed by me and considered in my medical decision making (see chart for details).

## 2019-12-04 ENCOUNTER — Ambulatory Visit: Payer: Medicare Other | Admitting: Family Medicine

## 2020-03-22 IMAGING — CR DG LUMBAR SPINE COMPLETE 4+V
5 series · 5 of 5 positions shown · non-contrast
Comparison: None.

CLINICAL DATA: Chronic low back pain radiating into the hips and
legs, no known injury

EXAM:
LUMBAR SPINE - COMPLETE 4+ VIEW

[t l-spine a.p. *]
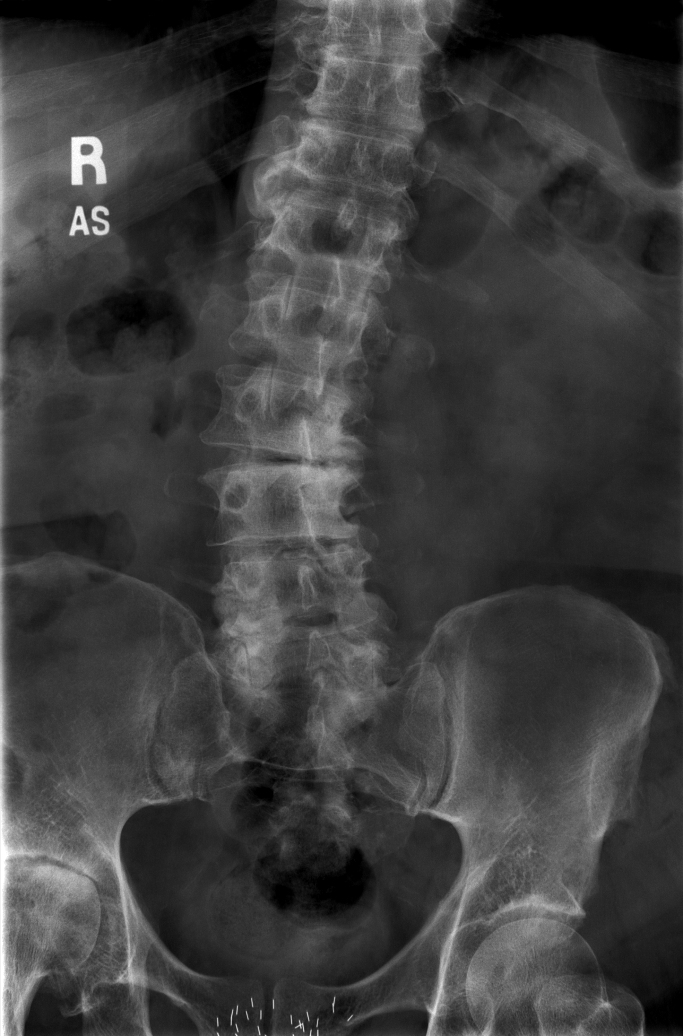

[t l-spine oblique exposure]
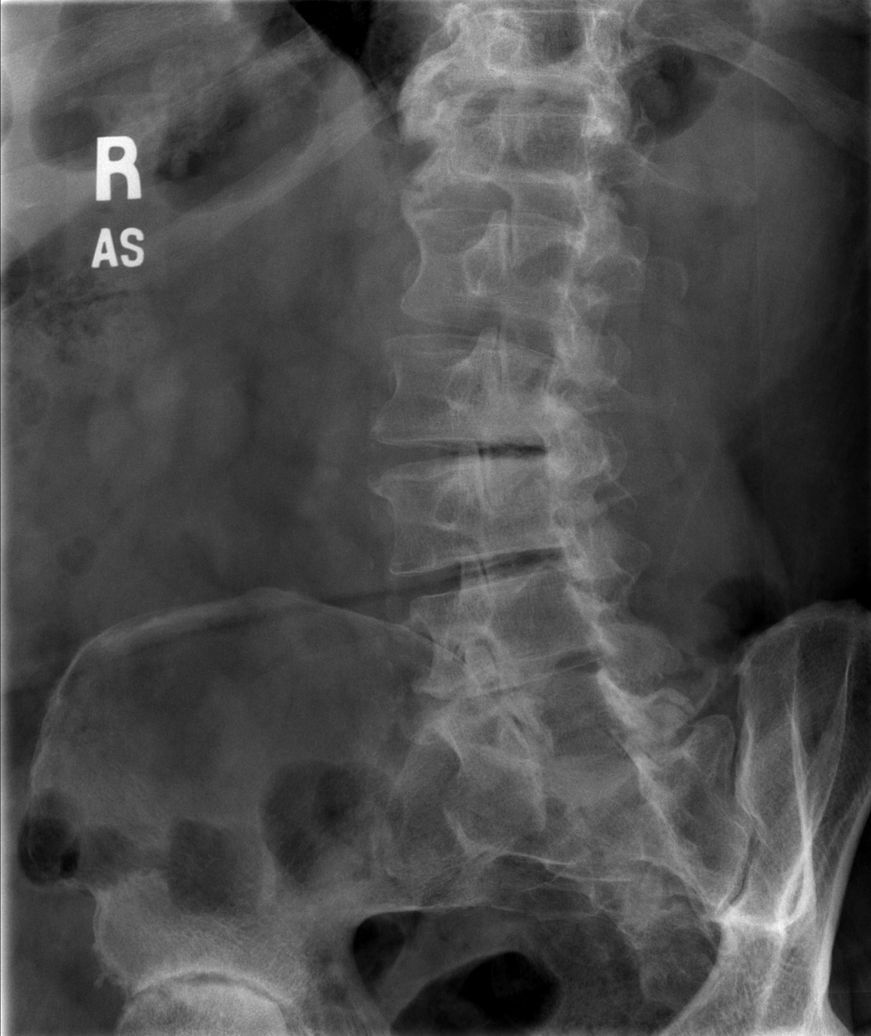

[t l-spine oblique exposure *]
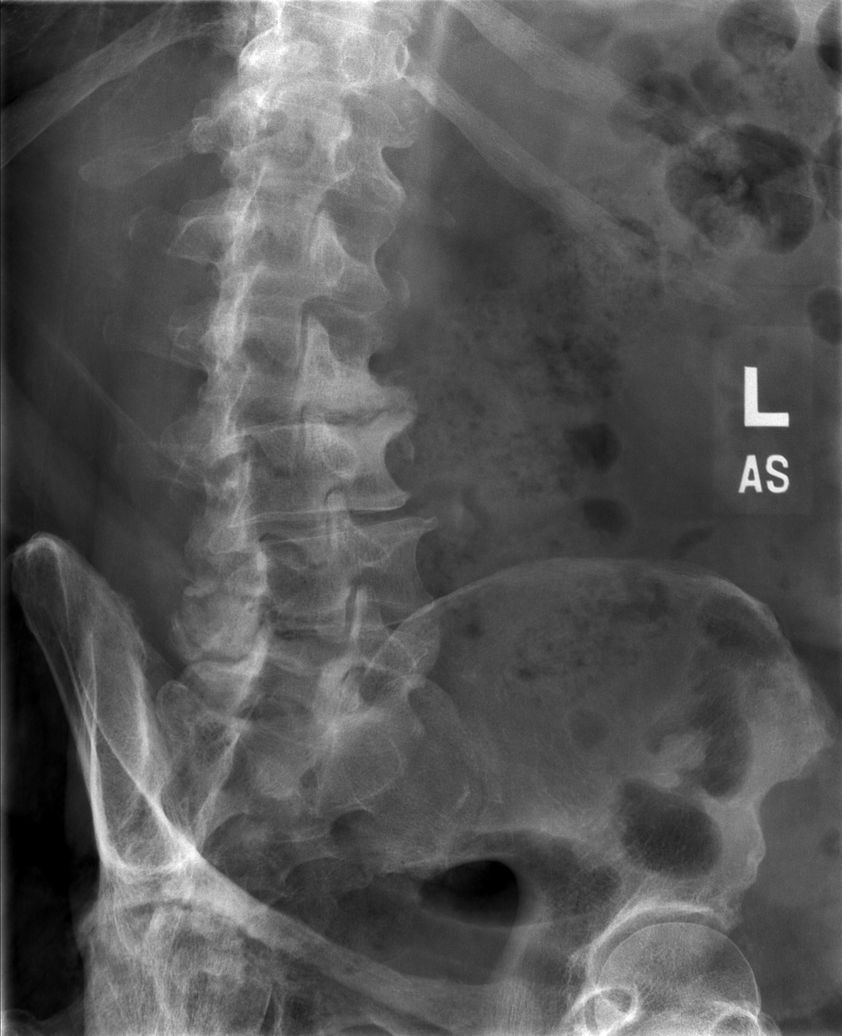

[t l-spine lat *]
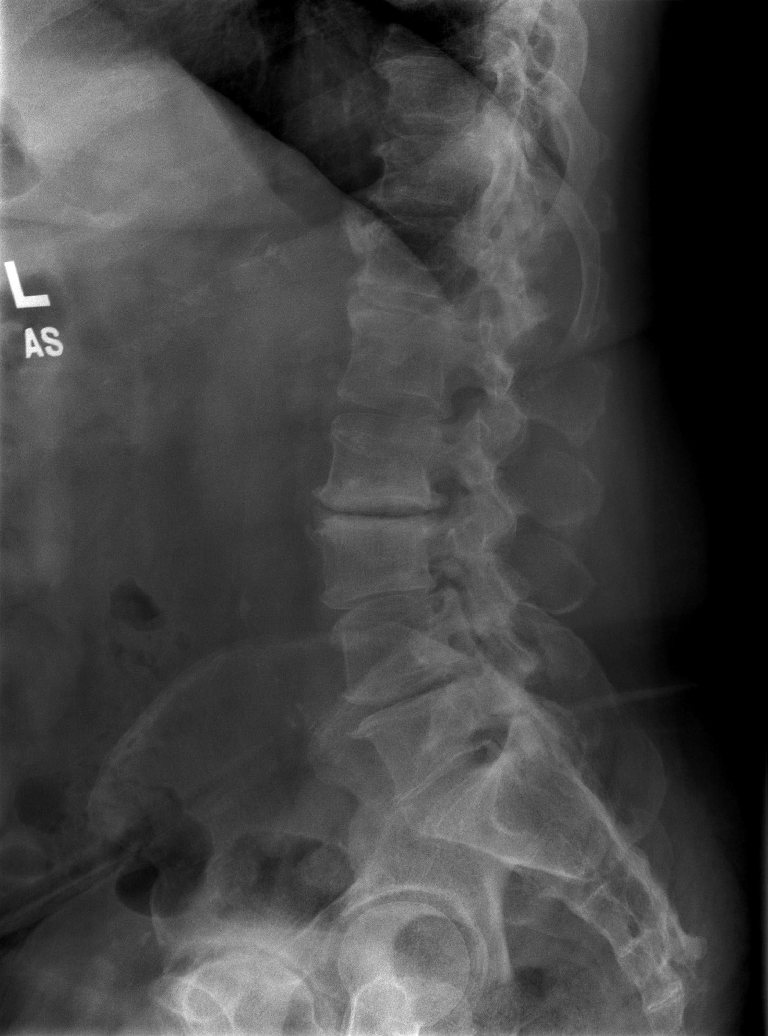

[t l-spine l5-s1 spot]
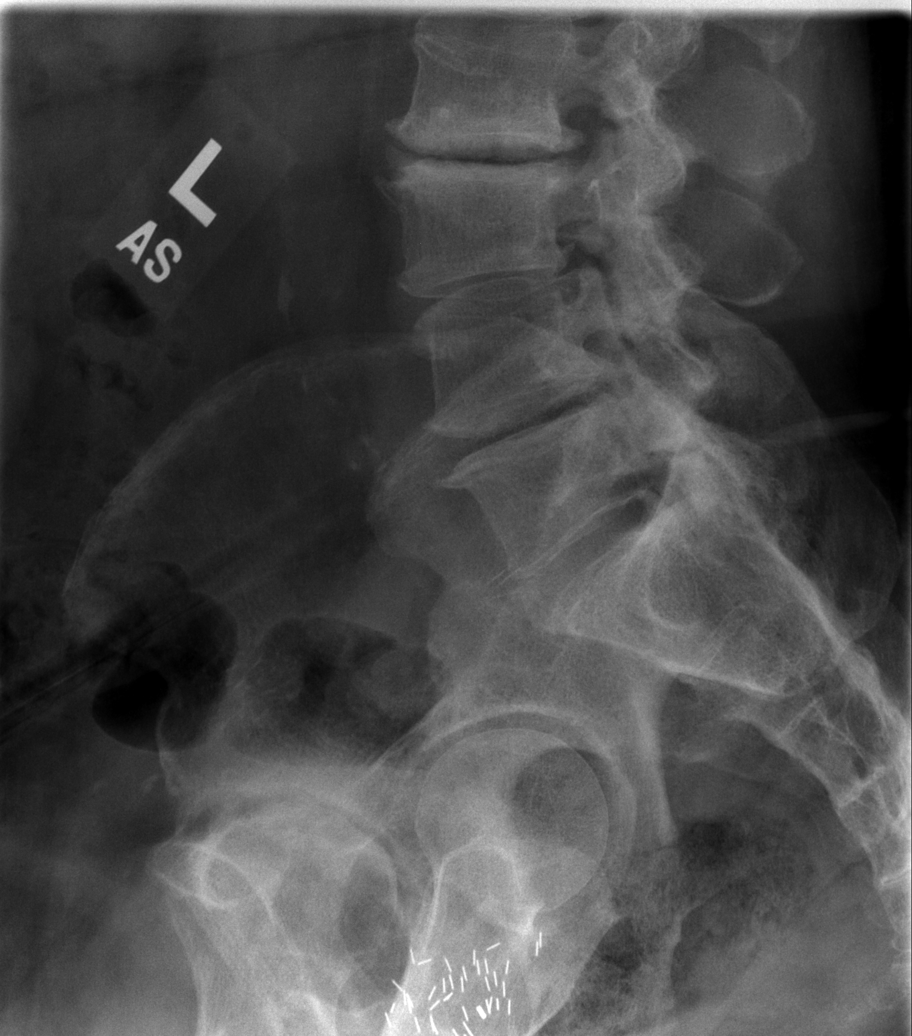

[5 of 5 positions shown; findings below may reference images not displayed]

FINDINGS: Lumbar vertebrae are in normal alignment. There is degenerative disc
disease most marked at L2-3 and L4-5 with mild change at L3-4. The
L1-2 and L5-S1 disc spaces appear well preserved. There is mild
curvature convex to the right of approximately 16 degrees. There is
significant degenerative joint disease involving the right hip and
avascular necrosis of that hip cannot be excluded. The left hip is
unremarkable.
IMPRESSION: 1. Mild curvature of the lumbar spine convex to the right by 16
degrees with degenerative disc disease at L2-3, L3-4, and L4-5
levels. No compression deformity.
2. Significant degenerative joint disease of the right hip. Possible
AVN.

## 2020-09-28 ENCOUNTER — Other Ambulatory Visit: Payer: Self-pay | Admitting: Orthopedic Surgery

## 2020-09-28 DIAGNOSIS — M25512 Pain in left shoulder: Secondary | ICD-10-CM

## 2020-09-28 DIAGNOSIS — M25511 Pain in right shoulder: Secondary | ICD-10-CM

## 2020-10-14 ENCOUNTER — Inpatient Hospital Stay: Admission: RE | Admit: 2020-10-14 | Payer: Medicare Other | Source: Ambulatory Visit

## 2020-10-14 ENCOUNTER — Other Ambulatory Visit: Payer: Medicare Other

## 2020-11-02 ENCOUNTER — Other Ambulatory Visit: Payer: BC Managed Care – PPO

## 2020-11-02 ENCOUNTER — Ambulatory Visit
Admission: RE | Admit: 2020-11-02 | Discharge: 2020-11-02 | Disposition: A | Discharge status: Home / Self Care | Payer: Medicare Other | Source: Ambulatory Visit | Attending: Orthopedic Surgery | Admitting: Orthopedic Surgery

## 2020-11-02 DIAGNOSIS — M25512 Pain in left shoulder: Secondary | ICD-10-CM

## 2022-03-07 ENCOUNTER — Emergency Department (HOSPITAL_BASED_OUTPATIENT_CLINIC_OR_DEPARTMENT_OTHER): Payer: Medicare Other

## 2022-03-07 ENCOUNTER — Encounter (HOSPITAL_BASED_OUTPATIENT_CLINIC_OR_DEPARTMENT_OTHER): Payer: Self-pay | Admitting: Emergency Medicine

## 2022-03-07 ENCOUNTER — Emergency Department (HOSPITAL_BASED_OUTPATIENT_CLINIC_OR_DEPARTMENT_OTHER)
Admission: EM | Admit: 2022-03-07 | Discharge: 2022-03-07 | Disposition: A | Discharge status: Home / Self Care | Payer: Medicare Other | Attending: Emergency Medicine | Admitting: Emergency Medicine

## 2022-03-07 ENCOUNTER — Other Ambulatory Visit: Payer: Self-pay

## 2022-03-07 DIAGNOSIS — R300 Dysuria: Secondary | ICD-10-CM | POA: Diagnosis not present

## 2022-03-07 DIAGNOSIS — Z8546 Personal history of malignant neoplasm of prostate: Secondary | ICD-10-CM | POA: Insufficient documentation

## 2022-03-07 DIAGNOSIS — R109 Unspecified abdominal pain: Secondary | ICD-10-CM

## 2022-03-07 DIAGNOSIS — K4020 Bilateral inguinal hernia, without obstruction or gangrene, not specified as recurrent: Secondary | ICD-10-CM

## 2022-03-07 DIAGNOSIS — Z79899 Other long term (current) drug therapy: Secondary | ICD-10-CM | POA: Insufficient documentation

## 2022-03-07 DIAGNOSIS — R1031 Right lower quadrant pain: Secondary | ICD-10-CM | POA: Diagnosis present

## 2022-03-07 DIAGNOSIS — E119 Type 2 diabetes mellitus without complications: Secondary | ICD-10-CM | POA: Insufficient documentation

## 2022-03-07 DIAGNOSIS — Z7984 Long term (current) use of oral hypoglycemic drugs: Secondary | ICD-10-CM | POA: Diagnosis not present

## 2022-03-07 DIAGNOSIS — K269 Duodenal ulcer, unspecified as acute or chronic, without hemorrhage or perforation: Secondary | ICD-10-CM | POA: Diagnosis not present

## 2022-03-07 DIAGNOSIS — Z7982 Long term (current) use of aspirin: Secondary | ICD-10-CM | POA: Diagnosis not present

## 2022-03-07 LAB — COMPREHENSIVE METABOLIC PANEL
ALT: 5 U/L (ref 0–44)
AST: 13 U/L — ABNORMAL LOW (ref 15–41)
Albumin: 4.3 g/dL (ref 3.5–5.0)
Alkaline Phosphatase: 64 U/L (ref 38–126)
Anion gap: 11 (ref 5–15)
BUN: 11 mg/dL (ref 8–23)
CO2: 27 mmol/L (ref 22–32)
Calcium: 9.6 mg/dL (ref 8.9–10.3)
Chloride: 99 mmol/L (ref 98–111)
Creatinine, Ser: 0.95 mg/dL (ref 0.61–1.24)
GFR, Estimated: >60 mL/min (ref >60)
Glucose, Bld: 91 mg/dL (ref 70–99)
Potassium: 3.7 mmol/L (ref 3.5–5.1)
Sodium: 137 mmol/L (ref 135–145)
Total Bilirubin: 0.6 mg/dL (ref 0.3–1.2)
Total Protein: 7.6 g/dL (ref 6.5–8.1)

## 2022-03-07 LAB — CBC WITH DIFFERENTIAL/PLATELET
Abs Immature Granulocytes: 0.01 10*3/uL (ref 0.00–0.07)
Basophils Absolute: 0.1 10*3/uL (ref 0.0–0.1)
Basophils Relative: 1 %
Eosinophils Absolute: 0.2 10*3/uL (ref 0.0–0.5)
Eosinophils Relative: 3 %
HCT: 40 % (ref 39.0–52.0)
Hemoglobin: 12.8 g/dL — ABNORMAL LOW (ref 13.0–17.0)
Immature Granulocytes: 0 %
Lymphocytes Relative: 18 %
Lymphs Abs: 1 10*3/uL (ref 0.7–4.0)
MCH: 30.5 pg (ref 26.0–34.0)
MCHC: 32 g/dL (ref 30.0–36.0)
MCV: 95.2 fL (ref 80.0–100.0)
Monocytes Absolute: 0.6 10*3/uL (ref 0.1–1.0)
Monocytes Relative: 10 %
Neutro Abs: 3.9 10*3/uL (ref 1.7–7.7)
Neutrophils Relative %: 68 %
Platelets: 247 10*3/uL (ref 150–400)
RBC: 4.2 MIL/uL — ABNORMAL LOW (ref 4.22–5.81)
RDW: 12.3 % (ref 11.5–15.5)
WBC: 5.8 10*3/uL (ref 4.0–10.5)
nRBC: 0 % (ref 0.0–0.2)

## 2022-03-07 LAB — URINALYSIS, ROUTINE W REFLEX MICROSCOPIC
Bilirubin Urine: NEGATIVE
Glucose, UA: NEGATIVE mg/dL
Hgb urine dipstick: NEGATIVE
Ketones, ur: NEGATIVE mg/dL
Leukocytes,Ua: NEGATIVE
Nitrite: NEGATIVE
Protein, ur: NEGATIVE mg/dL
Specific Gravity, Urine: <1.005 — ABNORMAL LOW (ref 1.005–1.030)
pH: 6.5 (ref 5.0–8.0)

## 2022-03-07 MED ORDER — IOHEXOL 300 MG/ML  SOLN
100.0000 mL | Freq: Once | INTRAMUSCULAR | Status: AC | PRN
  Administered 2022-03-07: 85 mL via INTRAVENOUS

## 2022-03-07 MED ORDER — SUCRALFATE 1 G PO TABS: 1.0000 g | ORAL_TABLET | Freq: Three times a day (TID) | ORAL | 0 refills | Status: DC

## 2022-03-07 MED ORDER — PANTOPRAZOLE SODIUM 40 MG PO TBEC: 40.0000 mg | DELAYED_RELEASE_TABLET | Freq: Every day | ORAL | 0 refills | Status: DC

## 2022-03-07 MED ORDER — SODIUM CHLORIDE 0.9 % IV BOLUS
1000.0000 mL | Freq: Once | INTRAVENOUS | Status: AC
  Administered 2022-03-07: 1000 mL via INTRAVENOUS

## 2022-03-07 MED ORDER — OXYCODONE-ACETAMINOPHEN 5-325 MG PO TABS
2.0000 | ORAL_TABLET | Freq: Once | ORAL | Status: AC
  Administered 2022-03-07: 2 via ORAL
  Filled 2022-03-07: qty 2

## 2022-03-07 MED ORDER — OXYCODONE-ACETAMINOPHEN 5-325 MG PO TABS: 1.0000 | ORAL_TABLET | ORAL | 0 refills | Status: AC | PRN

## 2022-03-07 NOTE — ED Triage Notes (Signed)
"  Feels like when I had kidney stones before"  Indicated pain in in lower abdominal suprapubic area"  Incontinence to urine while in waiting room

## 2022-03-07 NOTE — ED Notes (Signed)
Bladder scan performed (post void residual), results in >500 (post void residual). RN notified.

## 2022-03-07 NOTE — ED Notes (Signed)
Patient transported to CT 

## 2022-03-07 NOTE — Discharge Instructions (Signed)
Your history, exam, work-up today led [place] to do imaging that revealed both bilateral inguinal hernias without any entrapped bowel as well as swelling of 1 part of your duodenum concerning for duodenal ulcer.  I spoke to GI who feels you are safe for close follow-up and when she did start the medicines to help with your stomach acid in the lining of your stomach with the Protonix and Carafate.  Please use the pain medicine to help with your symptoms as well.  With the hernias, please follow-up with outpatient general surgery.  If any symptoms change or worsen acutely, please return to the nearest emergency department.  Please follow-up with your PCP.

## 2022-03-07 NOTE — ED Notes (Signed)
ED Provider at bedside. 

## 2022-03-07 NOTE — Progress Notes (Signed)
Patient at [facility] ED for abdominal pain. CT scan suggests duodenal ulcer. Dr. Candis Schatz reviewed images. No evidence for perforation. ED feels patient's pain can be managed at home. Recommended BID PPI and carafate x2 week( if renal  function okay). Given follow up with me on 12/5 at 2:30pm, this is soonest appt available. Patient known remotely to Dr Carlean Purl

## 2022-03-07 NOTE — ED Provider Notes (Signed)
[facility] Provider Note   CSN: [id number] Arrival date & time: 03/07/22  0118     History  Chief Complaint  Patient presents with   Abdominal Pain    Rodney Walsh is a 71 y.o. male.  The history is provided by the patient, medical records and the spouse. No language interpreter was used.  Abdominal Pain Pain location:  RLQ, LLQ and suprapubic Pain quality: aching, cramping and sharp   Pain radiates to:  Does not radiate Pain severity:  Severe Onset quality:  Gradual Duration:  2 days Timing:  Intermittent Progression:  Waxing and waning Chronicity:  Recurrent Context: not trauma   Relieved by:  Nothing Worsened by:  Urination Ineffective treatments:  None tried Associated symptoms: chills, diarrhea and dysuria   Associated symptoms: no chest pain, no constipation, no cough, no fatigue, no fever, no nausea, no shortness of breath and no vomiting        Home Medications Prior to Admission medications   Medication Sig Start Date End Date Taking? Authorizing Provider  aspirin 81 MG tablet Take 81 mg by mouth daily.    [provider]  atorvastatin (LIPITOR) 10 MG tablet SMARTSIG:1 Tablet(s) By Mouth Every Evening 07/24/19   [provider]  Blood Glucose Monitoring Suppl (ONE TOUCH ULTRA SYSTEM KIT) w/Device KIT Use to test blood sugar daily. 10/21/15   [name], PA  diclofenac sodium (VOLTAREN) 1 % GEL Apply topically 4 (four) times daily.    [provider]  HYDROcodone-acetaminophen (NORCO) 10-325 MG tablet Take 1 tablet by mouth 3 (three) times daily as needed.    [provider]  lisinopril (PRINIVIL,ZESTRIL) 20 MG tablet Take 1 tablet (20 mg total) by mouth daily. 02/24/17   [name], PA  metFORMIN (GLUCOPHAGE) 1000 MG tablet TAKE 1 TABLET BY MOUTH TWICE DAILY WITH A MEAL Patient taking differently: Take 1,000 mg by mouth daily with breakfast.  05/18/17   [name], [place]  Misc.  Devices (ROLLATOR) MISC Use with ambulation. 11/15/16   [name], PA  naproxen sodium (ALEVE) 220 MG tablet Take 220 mg by mouth daily as needed.     [provider]  oxybutynin (DITROPAN) 5 MG tablet Take 10 mg by mouth 2 (two) times daily. 07/01/19   [provider]  Vitamin D, Ergocalciferol, (DRISDOL) 50000 units CAPS capsule 50,000 Units every 7 (seven) days. 08/08/17   [provider]      Allergies    Patient has no known allergies.    Review of Systems   Review of Systems  Constitutional:  Positive for chills. Negative for diaphoresis, fatigue and fever.  HENT:  Negative for congestion.   Respiratory:  Negative for cough, chest tightness, shortness of breath and wheezing.   Cardiovascular:  Negative for chest pain and palpitations.  Gastrointestinal:  Positive for abdominal pain and diarrhea. Negative for constipation, nausea and vomiting.  Genitourinary:  Positive for decreased urine volume, dysuria and urgency. Negative for flank pain.  Musculoskeletal:  Negative for back pain, neck pain and neck stiffness.  Skin:  Negative for rash.  Neurological:  Negative for light-headedness and headaches.  Psychiatric/Behavioral:  Negative for agitation.   All other systems reviewed and are negative.   Physical Exam Updated Vital Signs BP (!) 162/78   Pulse (!) 57   Temp 98.8 F (37.1 C) (Oral)   Resp 20   Ht _0  (1.626 m)   Wt 79.4 kg   SpO2 98%  BMI 30.04 kg/m  Physical Exam Vitals and nursing note reviewed.  Constitutional:      General: He is not in acute distress.    Appearance: He is well-developed. He is not ill-appearing, toxic-appearing or diaphoretic.  HENT:     Head: Normocephalic and atraumatic.  Eyes:     Conjunctiva/sclera: Conjunctivae normal.  Cardiovascular:     Rate and Rhythm: Normal rate and regular rhythm.     Heart sounds: No murmur heard. Pulmonary:     Effort: Pulmonary effort is normal. No respiratory distress.      Breath sounds: Normal breath sounds. No wheezing, rhonchi or rales.  Chest:     Chest wall: No tenderness.  Abdominal:     General: Abdomen is flat. Bowel sounds are normal.     Palpations: Abdomen is soft.     Tenderness: There is no abdominal tenderness. There is no right CVA tenderness, left CVA tenderness, guarding or rebound.  Musculoskeletal:        General: No swelling.     Cervical back: Neck supple.  Skin:    General: Skin is warm and dry.     Capillary Refill: Capillary refill takes less than 2 seconds.     Coloration: Skin is not pale.     Findings: No rash.  Neurological:     Mental Status: He is alert.  Psychiatric:        Mood and Affect: Mood normal.     ED Results / Procedures / Treatments   Labs (all labs ordered are listed, but only abnormal results are displayed) Labs Reviewed  URINALYSIS, ROUTINE W REFLEX MICROSCOPIC - Abnormal; Notable for the following components:      Result Value   Color, Urine COLORLESS (*)    Specific Gravity, Urine <1.005 (*)    All other components within normal limits  CBC WITH DIFFERENTIAL/PLATELET - Abnormal; Notable for the following components:   RBC 4.20 (*)    Hemoglobin 12.8 (*)    All other components within normal limits  COMPREHENSIVE METABOLIC PANEL - Abnormal; Notable for the following components:   AST 13 (*)    All other components within normal limits  URINE CULTURE    EKG None  Radiology CT ABDOMEN PELVIS W CONTRAST  Result Date: 03/07/2022 CLINICAL DATA:  Abdominal pain. EXAM: CT ABDOMEN AND PELVIS WITH CONTRAST TECHNIQUE: Multidetector CT imaging of the abdomen and pelvis was performed using the standard protocol following bolus administration of intravenous contrast. RADIATION DOSE REDUCTION: This exam was performed according to the departmental dose-optimization program which includes automated exposure control, adjustment of the mA and/or kV according to patient size and/or use of iterative  reconstruction technique. CONTRAST:  40m OMNIPAQUE IOHEXOL 300 MG/ML  SOLN COMPARISON:  None Available. FINDINGS: Lower chest: No acute abnormality. Calcified granuloma at the left lung base. Hepatobiliary: No focal liver abnormality is seen. No cholelithiasis. Distended gallbladder. No gallbladder wall thickening or pericholecystic fluid. No intrahepatic or extrahepatic biliary ductal dilatation. Pancreas: Unremarkable. No pancreatic ductal dilatation or surrounding inflammatory changes. Spleen: Normal in size without focal abnormality. Adrenals/Urinary Tract: Adrenal glands are unremarkable. Kidneys are normal, without renal calculi, focal lesion, or hydronephrosis. Bladder is unremarkable. Stomach/Bowel: Duodenal wall thickening and mucosal irregularity along the posterior aspect of the second portion of the duodenum (image 25/series 2) with small amount of surrounding fluid concerning for a duodenal ulcer. No pneumoperitoneum, pneumatosis or portal venous gas. Diverticulosis without evidence of diverticulitis. Moderate amount of stool in the transverse colon. Moderate  amount of stool in the rectosigmoid colon. Vascular/Lymphatic: Normal caliber abdominal aorta with mild atherosclerosis. No lymphadenopathy. Reproductive: Prostatic radiation seeds noted. Other: No abdominopelvic ascites. Large fat containing right inguinal hernia. Small fat containing left inguinal hernia. Musculoskeletal: No acute osseous abnormality. No aggressive osseous lesion. Dextroscoliosis of the lumbar spine. Degenerative disease with disc height loss at L3-4, L4-5 and L5-S1 with bilateral facet arthropathy. Bilateral facet arthropathy at L5-S1. Broad-based disc osteophyte complex at L3-4 and moderate spinal stenosis. Mild osteoarthritis of bilateral SI joints. 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 IMPRESSION: 1. Duodenal wall thickening and mucosal irregularity along the posterior aspect of the second portion of the duodenum  with small amount of surrounding fluid concerning for a duodenal ulcer. No pneumoperitoneum, pneumatosis or portal venous gas. 2. Diverticulosis without evidence of diverticulitis. 3. Large fat containing right inguinal hernia. Small fat containing left inguinal hernia. 4. 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 5. Diffuse lumbar spine spondylosis as described above. 6.  Aortic Atherosclerosis (ICD10-I70.0). Electronically Signed   By: Kathreen Devoid M.D.   On: 03/07/2022 10:12    Procedures Procedures    Medications Ordered in ED Medications  oxyCODONE-acetaminophen (PERCOCET/ROXICET) 5-325 MG per tablet 2 tablet (2 tablets Oral Given 03/07/22 0442)  sodium chloride 0.9 % bolus 1,000 mL (0 mLs Intravenous Stopped 03/07/22 1044)  iohexol (OMNIPAQUE) 300 MG/ML solution 100 mL (85 mLs Intravenous Contrast Given 03/07/22 8657)    ED Course/ Medical Decision Making/ A&P                           Medical Decision Making Amount and/or Complexity of Data Reviewed Labs: ordered. Radiology: ordered.  Risk Prescription drug management.    Rodney Walsh is a 71 y.o. male with a past medical history significant for, hyperlipidemia, sleep apnea, diabetes, previous prostate cancer status post seed placement, and kidney stones who presents with severe lower abdominal pain, urgency, dysuria, and overall decreased urine amount.  According to patient, since chart yesterday has been having pain going across his lower abdomen that is 12 out of 10 in severity.  He reports no nausea, vomiting, or fevers but does have some mild chills.  He reports he feels that he constantly has to go urinate but it burns when it does and he does not get much out.  He reports no trauma or rashes.  Denies any constipation does report some diarrhea.  He reports that although he is going frequently he is urinating less than his baseline.  He denies any chest pain, shortness of breath, palpitations.  Denies any pain going to  his back.  Denies any other groin or scrotal pains.  Denies any pain in extremities.  Patient reports that he has been on oxybutynin chronically and on Linzess for urinary troubles and bowel troubles.  On exam, lungs clear and chest nontender.  Abdomen was surprisingly nontender but he is having the intermittent pain across his abdomen.  Possible inguinal hernias on exam but they were not tender.  Exam otherwise unremarkable.  Given the patient's pain going up to 12 out of 10 in severity and his urinalysis from triage not showing evidence of UTI, I am somewhat concerning to rule out other etiologies such as bladder outlet obstruction, diverticulitis, appendicitis, or some other intra-abdominal pathology given his history of prostate cancer and the surgery.  We will give him some fluids as he had dry mucous membranes and reported decreased urination but we  will also get some labs and a CT scan.  Anticipate reassessment after work-up to determine disposition.  CT scan showed evidence of possible duodenal ulcer.  No evidence of perforation abscess or pneumoperitoneum.  It also showed bilateral inguinal hernias.  Patient was informed of these findings and due to the ulcer discovery, we called GI.  GI reviewed the images and reported that he is safe for close follow-up with they will arrange and recommend he start a PPI and Carafate.  This was ordered for him.  Patient will also have follow-up with outpatient general surgery for his inguinal hernias which were not critically tender on him today.  No evidence of bowel inside or incarceration on exam.  Patient will follow-up with the PCP, GI team, and general surgery and understood return precautions.  He had no other questions or concerns and was discharged in good condition.        Final Clinical Impression(s) / ED Diagnoses Final diagnoses:  Duodenal ulcer  Abdominal pain, unspecified abdominal location  Bilateral inguinal hernia without  obstruction or gangrene, recurrence not specified    Rx / DC Orders ED Discharge Orders          Ordered    oxyCODONE-acetaminophen (PERCOCET/ROXICET) 5-325 MG tablet  Every 4 hours PRN        03/07/22 1524    pantoprazole (PROTONIX) 40 MG tablet  Daily        03/07/22 1524    sucralfate (CARAFATE) 1 g tablet  3 times daily with meals & bedtime        03/07/22 1524            Clinical Impression: 1. Duodenal ulcer   2. Abdominal pain, unspecified abdominal location   3. Bilateral inguinal hernia without obstruction or gangrene, recurrence not specified     Disposition: Discharge  Condition: Good  I have discussed the results, Dx and Tx plan with the pt(& family if present). He/she/they expressed understanding and agree(s) with the plan. Discharge instructions discussed at great length. Strict return precautions discussed and pt &/or family have verbalized understanding of the instructions. No further questions at time of discharge.    Discharge Medication List as of 03/07/2022  3:28 PM     START taking these medications   Details  oxyCODONE-acetaminophen (PERCOCET/ROXICET) 5-325 MG tablet Take 1 tablet by mouth every 4 (four) hours as needed for severe pain., Starting Mon 03/07/2022, Normal    pantoprazole (PROTONIX) 40 MG tablet Take 1 tablet (40 mg total) by mouth daily., Starting Mon 03/07/2022, Normal    sucralfate (CARAFATE) 1 g tablet Take 1 tablet (1 g total) by mouth 4 (four) times daily -  with meals and at bedtime., Starting Mon 03/07/2022, Normal        Follow Up: [facility] [place] [address] [place], [place] [place] [address] [phone]     Surgery, [place] [address] [place] [address] [phone]         Lossie Kalp, Gwenyth Allegra, MD 03/07/22 1600

## 2022-03-08 LAB — URINE CULTURE: Culture: NO GROWTH

## 2022-04-12 ENCOUNTER — Ambulatory Visit: Payer: Medicare Other | Admitting: Nurse Practitioner

## 2022-05-19 ENCOUNTER — Other Ambulatory Visit (INDEPENDENT_AMBULATORY_CARE_PROVIDER_SITE_OTHER): Payer: Medicare Other

## 2022-05-19 ENCOUNTER — Ambulatory Visit (INDEPENDENT_AMBULATORY_CARE_PROVIDER_SITE_OTHER): Payer: Medicare Other | Admitting: Nurse Practitioner

## 2022-05-19 ENCOUNTER — Encounter: Payer: Self-pay | Admitting: Nurse Practitioner

## 2022-05-19 VITALS — BP 140/68 | HR 55 | Ht 64.0 in | Wt 181.0 lb

## 2022-05-19 DIAGNOSIS — D649 Anemia, unspecified: Secondary | ICD-10-CM | POA: Diagnosis not present

## 2022-05-19 DIAGNOSIS — R933 Abnormal findings on diagnostic imaging of other parts of digestive tract: Secondary | ICD-10-CM

## 2022-05-19 LAB — FOLATE: Folate: 7.2 ng/mL (ref >5.9)

## 2022-05-19 LAB — VITAMIN B12: Vitamin B-12: 365 pg/mL (ref 211–911)

## 2022-05-19 NOTE — Patient Instructions (Addendum)
_______________________________________________________  If you are age 73 or older, your body mass index should be between 23-30. Your Body mass index is 31.07 kg/m. If this is out of the aforementioned range listed, please consider follow up with your Primary Care Provider.  If you are age 10 or younger, your body mass index should be between 19-25. Your Body mass index is 31.07 kg/m. If this is out of the aformentioned range listed, please consider follow up with your Primary Care Provider.   ________________________________________________________  The [facility] providers would like to encourage you to use [facility] to communicate with providers for non-urgent requests or questions.  Due to long hold times on the telephone, sending your provider a message by [facility] may be a faster and more efficient way to get a response.  Please allow 48 business hours for a response.  Please remember that this is for non-urgent requests.  _______________________________________________________  No anti-inflammatories for now including Ibuprofen, Aleve, Motrin, Meloxicam, voltaren, celebrex or Goody powders until we can complete evaluation of anemia  Your provider has requested that you go to the basement level for lab work before leaving today. Press "B" on the elevator. The lab is located at the first door on the left as you exit the elevator.  You have been scheduled for an endoscopy and colonoscopy. Please follow the written instructions given to you at your visit today. Please pick up your prep supplies at the pharmacy within the next 1-3 days. If you use inhalers (even only as needed), please bring them with you on the day of your procedure.  Please call with any questions or concerns.  It was a pleasure to see you today!  Thank you for trusting me with your gastrointestinal care!

## 2022-05-19 NOTE — Progress Notes (Signed)
Assessment    Patient profile:  Rodney Walsh is a 72 y.o. year old male , known to Dr. Carlean Purl with a past medical history of colon polyps, hypertension, asthma, diabetes, chronic constipation.  See PMH / [place] for additional history. Referred by PCP for anemia.   # Chronic [place] anemia. Hgb 12.8. Hgb has actually been stable in the 12-13 range over the last couple of years ( Labs reviewed in [place]). No overt GI bleeding or focal GI symptoms.  CT scan in October concerning for duodenal ulcer. He had been taking NSAIDs.  He completed a month of PPI and carafate. No longer taking Diclofenac   # Screening colonoscopy. Due for 10 year recall in March 2024.   # Large right inguinal hernia containing fat. Since it doesn't contain bowel there shouldn't be a problem with colonoscopy  # Chronic constipation. Manages well with Miralax or Linzess.    Plan:    Schedule for EGD for evaluation of anemia and CT scan findings. Will also proceed with colonoscopy.  The risks and benefits of EGD and colonoscopy with possible biopsies  / polypectomy were discussed with the patient who agrees to proceed. No anti-inflammatories for now including Ibuprofen, Aleve, Motrin, Meloxicam, voltaren, celebrex or Goody powders until we can our complete evaluation. Marland Kitchen  Update CBC today Iron studies   HPI:    Chief Complaint:  anemia  Patient referred for anemia. His hgb was 14.5 in 2019, down to 12.8 the end of October. No overt GI bleeding . No blood in urine. Doesn't donate blood. He takes a daily baby asa. He was taking Voltaren on regular basis until a couple of months ago. Sometimes takes aleve.  Rodney Walsh takes Linzess when he can afford it. Otherwise he takes miralax every day for constipation.   A review of records shows that Rodney Walsh was in the ED 03/07/22 with groin pain. CT scan showed a large fat containing right inguinal hernia.  According to EDP note, he would follow up outpatient with Surgery.   He tells me that he was told by someone that surgery wasn't needed though I don't see any surgical consults / office visits in Epic..   The CT scan also showed duodenal wall thickening and mucosal irregularity along the posterior aspect of the second portion of the second portion of the duodenum with small amount of surrounding fluid concerning for a duodenal ulcer. He wasn't having  upper abdominal symptoms. He completed a month of protonix and sucralfate prescribed by ED. No longer on either. He has no upper GI symptoms, never did actually.   Previous Labs / Imaging::    Latest Ref Rng & Units 03/07/2022    9:04 AM 05/26/2017    3:56 PM 02/17/2017   11:34 AM  CBC  WBC 4.0 - 10.5 K/uL 5.8  5.4  3.9   Hemoglobin 13.0 - 17.0 g/dL 12.8  14.5  13.3   Hematocrit 39.0 - 52.0 % 40.0  44.0  40.1   Platelets 150 - 400 K/uL 247  255  245     No results found for: "LIPASE"    Latest Ref Rng & Units 03/07/2022    9:04 AM 09/12/2017    5:28 PM 05/26/2017    3:56 PM  CMP  Glucose 70 - 99 mg/dL 91  91  87   BUN 8 - 23 mg/dL '11  19  17   '$ Creatinine 0.61 - 1.24 mg/dL 0.95  1.10  1.22  Sodium 135 - 145 mmol/L 137  140  141   Potassium 3.5 - 5.1 mmol/L 3.7  5.0  5.0   Chloride 98 - 111 mmol/L 99  101  100   CO2 22 - 32 mmol/L '27  21  22   '$ Calcium 8.9 - 10.3 mg/dL 9.6  10.2  10.0   Total Protein 6.5 - 8.1 g/dL 7.6  7.5  7.4   Total Bilirubin 0.3 - 1.2 mg/dL 0.6  0.3  0.3   Alkaline Phos 38 - 126 U/L 64  82  71   AST 15 - 41 U/L '13  15  15   '$ ALT 0 - 44 U/L '5  13  12     '$ Previous GI Evaluation   March 2014 colonoscopy -no polyps.  -moderate diverticulosis.   Imaging:  CT ABDOMEN PELVIS W CONTRAST CLINICAL DATA:  Abdominal pain.  EXAM: CT ABDOMEN AND PELVIS WITH CONTRAST  TECHNIQUE: Multidetector CT imaging of the abdomen and pelvis was performed using the standard protocol following bolus administration of intravenous contrast.  RADIATION DOSE REDUCTION: This exam was performed  according to the departmental dose-optimization program which includes automated exposure control, adjustment of the mA and/or kV according to patient size and/or use of iterative reconstruction technique.  CONTRAST:  92m OMNIPAQUE IOHEXOL 300 MG/ML  SOLN  COMPARISON:  None Available.  FINDINGS: Lower chest: No acute abnormality. Calcified granuloma at the left lung base.  Hepatobiliary: No focal liver abnormality is seen. No cholelithiasis. Distended gallbladder. No gallbladder wall thickening or pericholecystic fluid. No intrahepatic or extrahepatic biliary ductal dilatation.  Pancreas: Unremarkable. No pancreatic ductal dilatation or surrounding inflammatory changes.  Spleen: Normal in size without focal abnormality.  Adrenals/Urinary Tract: Adrenal glands are unremarkable. Kidneys are normal, without renal calculi, focal lesion, or hydronephrosis. Bladder is unremarkable.  Stomach/Bowel: Duodenal wall thickening and mucosal irregularity along the posterior aspect of the second portion of the duodenum (image 25/series 2) with small amount of surrounding fluid concerning for a duodenal ulcer. No pneumoperitoneum, pneumatosis or portal venous gas. Diverticulosis without evidence of diverticulitis. Moderate amount of stool in the transverse colon. Moderate amount of stool in the rectosigmoid colon.  Vascular/Lymphatic: Normal caliber abdominal aorta with mild atherosclerosis. No lymphadenopathy.  Reproductive: Prostatic radiation seeds noted.  Other: No abdominopelvic ascites. Large fat containing right inguinal hernia. Small fat containing left inguinal hernia.  Musculoskeletal: No acute osseous abnormality. No aggressive osseous lesion. Dextroscoliosis of the lumbar spine. Degenerative disease with disc height loss at L3-4, L4-5 and L5-S1 with bilateral facet arthropathy. Bilateral facet arthropathy at L5-S1. Broad-based disc osteophyte complex at L3-4 and moderate  spinal stenosis. Mild osteoarthritis of bilateral SI joints. 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  IMPRESSION: 1. Duodenal wall thickening and mucosal irregularity along the posterior aspect of the second portion of the duodenum with small amount of surrounding fluid concerning for a duodenal ulcer. No pneumoperitoneum, pneumatosis or portal venous gas. 2. Diverticulosis without evidence of diverticulitis. 3. Large fat containing right inguinal hernia. Small fat containing left inguinal hernia. 4. 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 5. Diffuse lumbar spine spondylosis as described above. 6.  Aortic Atherosclerosis (ICD10-I70.0).  Electronically Signed   By: HKathreen DevoidM.D.   On: 03/07/2022 10:12    Past Medical History:  Diagnosis Date   Arthritis    left shoulder and left knee   Arthritis    r hip, r knee   Diabetes mellitus without complication (HCC)    Hyperlipidemia  Hypertension    Nephrolithiasis    OSA on CPAP    Prostate cancer [facility])    Past Surgical History:  Procedure Laterality Date   BACK SURGERY     COLONOSCOPY N/A 07/17/2012   Procedure: COLONOSCOPY;  Surgeon: Gatha Mayer, MD;  Location: [facility] END[facility]COPY;  Service: Endoscopy;  Laterality: N/A;   PROSTATE SURGERY     with seed placement   Family History  Problem Relation Age of Onset   Cancer Mother        ovarian   Alzheimer's disease Father    Hypertension Sister    Cancer Sister        ?uterine   Hypertension Sister    Diabetes Sister    Hyperlipidemia Sister    Hypertension Sister    Diabetes Brother    Renal Disease Brother    Heart disease Brother    Diabetes Daughter    Seizures Daughter    Alcohol abuse Son    Drug abuse Son        heroin, prescription opiates   Alcohol abuse Son    Colon cancer Neg Hx    Liver cancer Neg Hx    Esophageal cancer Neg Hx    Social History   Tobacco Use   Smoking status: Former    Packs/day: 1.00    Years: 45.00     Total pack years: 45.00    Types: Cigarettes   Smokeless tobacco: Never   Tobacco comments:    Quit in 2008  Vaping Use   Vaping Use: Never used  Substance Use Topics   Alcohol use: No   Drug use: Never   Current Outpatient Medications  Medication Sig Dispense Refill   amLODipine (NORVASC) 5 MG tablet Take 5 mg by mouth daily.     aspirin 81 MG tablet Take 81 mg by mouth daily.     atorvastatin (LIPITOR) 10 MG tablet SMARTSIG:1 Tablet(s) By Mouth Every Evening     Blood Glucose Monitoring Suppl (ONE TOUCH ULTRA SYSTEM KIT) w/Device KIT Use to test blood sugar daily. 1 each 0   diclofenac sodium (VOLTAREN) 1 % GEL Apply topically 4 (four) times daily.     fesoterodine (TOVIAZ) 4 MG TB24 tablet Take 4 mg by mouth daily.     HYDROcodone-acetaminophen (NORCO) 10-325 MG tablet Take 1 tablet by mouth 3 (three) times daily as needed.     LINZESS 72 MCG capsule Take 72 mcg by mouth daily.     lisinopril (PRINIVIL,ZESTRIL) 20 MG tablet Take 1 tablet (20 mg total) by mouth daily. 90 tablet 3   metFORMIN (GLUCOPHAGE) 1000 MG tablet TAKE 1 TABLET BY MOUTH TWICE DAILY WITH A MEAL (Patient taking differently: Take 1,000 mg by mouth daily with breakfast.) 180 tablet 1   Misc. Devices (ROLLATOR) MISC Use with ambulation. 1 each 0   naproxen sodium (ALEVE) 220 MG tablet Take 220 mg by mouth daily as needed.      oxybutynin (DITROPAN) 5 MG tablet Take 10 mg by mouth 2 (two) times daily.     oxyCODONE-acetaminophen (PERCOCET/ROXICET) 5-325 MG tablet Take 1 tablet by mouth every 4 (four) hours as needed for severe pain. 15 tablet 0   pantoprazole (PROTONIX) 40 MG tablet Take 1 tablet (40 mg total) by mouth daily. 30 tablet 0   sucralfate (CARAFATE) 1 g tablet Take 1 tablet (1 g total) by mouth 4 (four) times daily -  with meals and at bedtime. 90 tablet 0   Vitamin D,  Ergocalciferol, (DRISDOL) 50000 units CAPS capsule 50,000 Units every 7 (seven) days.     No current facility-administered medications for  this visit.   No Known Allergies   Review of Systems: Positive for arthritis, generalized itching, urine leakage.  All other systems reviewed and negative except where noted in HPI.   Wt Readings from Last 3 Encounters:  05/19/22 181 lb (82.1 kg)  03/07/22 175 lb (79.4 kg)  09/02/19 238 lb 5 oz (108.1 kg)    Physical Exam   BP (!) 140/68   Pulse (!) 55   Ht '5\' 4"'$  (1.626 m)   Wt 181 lb (82.1 kg)   SpO2 98%   BMI 31.07 kg/m  Constitutional:  Generally well appearing male in no acute distress. Psychiatric: Pleasant. Normal mood and affect. Behavior is normal. EENT: Pupils normal.  Conjunctivae are normal. No scleral icterus. Neck supple.  Cardiovascular: Normal rate, regular rhythm.  Pulmonary/chest: Effort normal and breath sounds normal. No wheezing, rales or rhonchi. Abdominal: Soft, nondistended, nontender. Bowel sounds active throughout. There are no masses palpable. No hepatomegaly. Neurological: Alert and oriented to person place and time. Extremities: No edema Skin: Skin is warm and dry. No rashes noted.  Tye Savoy, NP  05/19/2022, 3:17 PM  Cc:  Referring Provider [name], PA

## 2022-05-20 LAB — IRON,TIBC AND FERRITIN PANEL
%SAT: 21 % (calc) (ref 20–48)
Ferritin: 73 ng/mL (ref 24–380)
Iron: 56 ug/dL (ref 50–180)
TIBC: 266 mcg/dL (calc) (ref 250–425)

## 2022-05-24 ENCOUNTER — Telehealth: Payer: Self-pay | Admitting: Nurse Practitioner

## 2022-05-24 NOTE — Telephone Encounter (Signed)
Are you willing to prescribe Carafate to this patient?

## 2022-05-24 NOTE — Telephone Encounter (Signed)
Patient called said he came in yesterday but he just now realized the Carafate medication he is really low on requesting a refill. To be sent to [facility] in [place].

## 2022-05-25 NOTE — Telephone Encounter (Signed)
Patient stated that he isn't having any issues. He was wondering if he should continue to take it. He said he finished his last 4 pills he had and I told him that if he wasn't having any upper abdominal discomfort then he should be fine to be off the medication but if he is having discomfort then we can see about ordering more and he said he will let us know if he is having any trouble before his procedure but he is doing fine

## 2022-06-21 ENCOUNTER — Telehealth: Payer: Self-pay | Admitting: Nurse Practitioner

## 2022-06-21 NOTE — Telephone Encounter (Signed)
Inbound call from patient, would like a nurse to call him and go over prep instructions for clarification purposes. Please advise.

## 2022-06-21 NOTE — Telephone Encounter (Signed)
Questions answered. Told patient to call in the morning if he has any other questions.

## 2022-06-23 ENCOUNTER — Ambulatory Visit (AMBULATORY_SURGERY_CENTER): Payer: Medicare Other | Admitting: Internal Medicine

## 2022-06-23 ENCOUNTER — Encounter: Payer: Self-pay | Admitting: Internal Medicine

## 2022-06-23 VITALS — BP 104/72 | HR 75 | Temp 98.4°F | Resp 11 | Ht 64.0 in | Wt 181.0 lb

## 2022-06-23 DIAGNOSIS — B9681 Helicobacter pylori [H. pylori] as the cause of diseases classified elsewhere: Secondary | ICD-10-CM

## 2022-06-23 DIAGNOSIS — K2980 Duodenitis without bleeding: Secondary | ICD-10-CM

## 2022-06-23 DIAGNOSIS — D649 Anemia, unspecified: Secondary | ICD-10-CM

## 2022-06-23 DIAGNOSIS — R933 Abnormal findings on diagnostic imaging of other parts of digestive tract: Secondary | ICD-10-CM | POA: Diagnosis not present

## 2022-06-23 DIAGNOSIS — K2950 Unspecified chronic gastritis without bleeding: Secondary | ICD-10-CM

## 2022-06-23 MED ORDER — PANTOPRAZOLE SODIUM 40 MG PO TBEC: 40.0000 mg | DELAYED_RELEASE_TABLET | Freq: Every day | ORAL | 3 refills | Status: AC

## 2022-06-23 MED ORDER — SODIUM CHLORIDE 0.9 % IV SOLN: 500.0000 mL | Freq: Once | INTRAVENOUS | Status: DC

## 2022-06-23 MED ORDER — PANTOPRAZOLE SODIUM 40 MG PO TBEC: 40.0000 mg | DELAYED_RELEASE_TABLET | Freq: Every day | ORAL | 3 refills | Status: DC

## 2022-06-23 MED ORDER — PEG 3350-KCL-NA BICARB-NACL 420 G PO SOLR: 4000.0000 mL | Freq: Once | ORAL | 0 refills | Status: AC

## 2022-06-23 MED ORDER — LINACLOTIDE 145 MCG PO CAPS: 145.0000 ug | ORAL_CAPSULE | Freq: Every day | ORAL | 11 refills | Status: AC

## 2022-06-23 MED ORDER — LINACLOTIDE 145 MCG PO CAPS: 145.0000 ug | ORAL_CAPSULE | Freq: Every day | ORAL | 11 refills | Status: DC

## 2022-06-23 NOTE — Progress Notes (Signed)
[facility] History and Physical   Primary Care Physician:  Rodney Mons, PA   Reason for Procedure:   Anemia, abnormal duodenum on CT  Plan:    EGD and colonoscopy (cancelled - prep inadequate)     HPI: Rodney Walsh is a 72 y.o. male  Patient referred for anemia. His hgb was 14.5 in 2019, down to 12.8 the end of October. No overt GI bleeding . No blood in urine. Doesn't donate blood. He takes a daily baby asa. He was taking Voltaren on regular basis until a couple of months ago. Sometimes takes aleve.   Rodney Walsh takes Linzess when he can afford it. Otherwise he takes miralax every day for constipation.    A review of records shows that Rodney Walsh was in the ED 03/07/22 with groin pain. CT scan showed a large fat containing right inguinal hernia.  According to EDP note, he would follow up outpatient with Surgery.  He tells me that he was told by someone that surgery wasn't needed though I don't see any surgical consults / office visits in Epic..    The CT scan also showed duodenal wall thickening and mucosal irregularity along the posterior aspect of the second portion of the second portion of the duodenum with small amount of surrounding fluid concerning for a duodenal ulcer. He wasn't having  upper abdominal symptoms. He completed a month of protonix and sucralfate prescribed by ED. No longer on either. He has no upper GI symptoms, never did actually.   Only had 4 BM's w/ prep and not clear so will not do colonoscopy He had a small adenoma in 2009 and no polyps 2014 colonoscopy  Lab Results  Component Value Date   IRON 56 05/19/2022   TIBC 266 05/19/2022   FERRITIN 73 05/19/2022   Lab Results  Component Value Date   VITAMINB12 365 05/19/2022   Lab Results  Component Value Date   FOLATE 7.2 05/19/2022   Lab Results  Component Value Date   WBC 5.8 03/07/2022   HGB 12.8 (L) 03/07/2022   HCT 40.0 03/07/2022   MCV 95.2 03/07/2022   PLT 247 03/07/2022     Past  Medical History:  Diagnosis Date   Arthritis    left shoulder and left knee   Arthritis    r hip, r knee   Diabetes mellitus without complication ([place])    Hyperlipidemia    Hypertension    Nephrolithiasis    OSA on CPAP    Prostate cancer [facility])     Past Surgical History:  Procedure Laterality Date   BACK SURGERY     COLONOSCOPY N/A 07/17/2012   Procedure: COLONOSCOPY;  Surgeon: Gatha Mayer, MD;  Location[facility];  Service: Endoscopy;  Laterality: N/A;   PROSTATE SURGERY     with seed placement    Prior to Admission medications   Medication Sig Start Date End Date Taking? Authorizing Provider  amLODipine (NORVASC) 5 MG tablet Take 5 mg by mouth daily. 02/10/22  Yes [provider]  aspirin 81 MG tablet Take 81 mg by mouth daily.   Yes [provider]  Blood Glucose Monitoring Suppl (ONE TOUCH ULTRA SYSTEM KIT) w/Device KIT Use to test blood sugar daily. 10/21/15  Yes Jeffery, Chelle, PA  diclofenac sodium (VOLTAREN) 1 % GEL Apply topically 4 (four) times daily.   Yes [provider]  LINZESS 72 MCG capsule Take 72 mcg by mouth daily. 02/11/22  Yes [provider]  lisinopril (PRINIVIL,ZESTRIL)  20 MG tablet Take 1 tablet (20 mg total) by mouth daily. 02/24/17  Yes Jeffery, Chelle, PA  oxybutynin (DITROPAN) 5 MG tablet Take 10 mg by mouth 2 (two) times daily. 07/01/19  Yes [provider]  oxyCODONE-acetaminophen (PERCOCET/ROXICET) 5-325 MG tablet Take 1 tablet by mouth every 4 (four) hours as needed for severe pain. 03/07/22  Yes Tegeler, Gwenyth Allegra, MD  Vitamin D, Ergocalciferol, (DRISDOL) 50000 units CAPS capsule 50,000 Units every 7 (seven) days. 08/08/17  Yes [provider]  atorvastatin (LIPITOR) 10 MG tablet SMARTSIG:1 Tablet(s) By Mouth Every Evening Patient not taking: Reported on 06/23/2022 07/24/19   [provider]  fesoterodine (TOVIAZ) 4 MG TB24 tablet Take 4 mg by mouth daily. Patient not taking:  Reported on 06/23/2022 02/28/22   [provider]  HYDROcodone-acetaminophen (NORCO) 10-325 MG tablet Take 1 tablet by mouth 3 (three) times daily as needed.    [provider]  metFORMIN (GLUCOPHAGE) 1000 MG tablet TAKE 1 TABLET BY MOUTH TWICE DAILY WITH A MEAL Patient taking differently: Take 1,000 mg by mouth daily with breakfast. 05/18/17   Rodney Walsh, [place]  Misc. Devices (ROLLATOR) MISC Use with ambulation. 11/15/16   Rodney Mons, PA  naproxen sodium (ALEVE) 220 MG tablet Take 220 mg by mouth daily as needed.     [provider]  pantoprazole (PROTONIX) 40 MG tablet Take 1 tablet (40 mg total) by mouth daily. Patient not taking: Reported on 06/23/2022 03/07/22   Tegeler, Gwenyth Allegra, MD  sucralfate (CARAFATE) 1 g tablet Take 1 tablet (1 g total) by mouth 4 (four) times daily -  with meals and at bedtime. Patient not taking: Reported on 06/23/2022 03/07/22   Tegeler, Gwenyth Allegra, MD    Current Outpatient Medications  Medication Sig Dispense Refill   amLODipine (NORVASC) 5 MG tablet Take 5 mg by mouth daily.     aspirin 81 MG tablet Take 81 mg by mouth daily.     Blood Glucose Monitoring Suppl (ONE TOUCH ULTRA SYSTEM KIT) w/Device KIT Use to test blood sugar daily. 1 each 0   diclofenac sodium (VOLTAREN) 1 % GEL Apply topically 4 (four) times daily.     LINZESS 72 MCG capsule Take 72 mcg by mouth daily.     lisinopril (PRINIVIL,ZESTRIL) 20 MG tablet Take 1 tablet (20 mg total) by mouth daily. 90 tablet 3   oxybutynin (DITROPAN) 5 MG tablet Take 10 mg by mouth 2 (two) times daily.     oxyCODONE-acetaminophen (PERCOCET/ROXICET) 5-325 MG tablet Take 1 tablet by mouth every 4 (four) hours as needed for severe pain. 15 tablet 0   Vitamin D, Ergocalciferol, (DRISDOL) 50000 units CAPS capsule 50,000 Units every 7 (seven) days.     atorvastatin (LIPITOR) 10 MG tablet SMARTSIG:1 Tablet(s) By Mouth Every Evening (Patient not taking: Reported on 06/23/2022)      fesoterodine (TOVIAZ) 4 MG TB24 tablet Take 4 mg by mouth daily. (Patient not taking: Reported on 06/23/2022)     HYDROcodone-acetaminophen (NORCO) 10-325 MG tablet Take 1 tablet by mouth 3 (three) times daily as needed.     metFORMIN (GLUCOPHAGE) 1000 MG tablet TAKE 1 TABLET BY MOUTH TWICE DAILY WITH A MEAL (Patient taking differently: Take 1,000 mg by mouth daily with breakfast.) 180 tablet 1   Misc. Devices (ROLLATOR) MISC Use with ambulation. 1 each 0   naproxen sodium (ALEVE) 220 MG tablet Take 220 mg by mouth daily as needed.      pantoprazole (PROTONIX) 40 MG tablet Take  1 tablet (40 mg total) by mouth daily. (Patient not taking: Reported on 06/23/2022) 30 tablet 0   sucralfate (CARAFATE) 1 g tablet Take 1 tablet (1 g total) by mouth 4 (four) times daily -  with meals and at bedtime. (Patient not taking: Reported on 06/23/2022) 90 tablet 0   Current Facility-Administered Medications  Medication Dose Route Frequency Provider Last Rate Last Admin   0.9 %  sodium chloride infusion  500 mL Intravenous Once Gatha Mayer, MD        Allergies as of 06/23/2022   (No Known Allergies)    Family History  Problem Relation Age of Onset   Cancer Mother        ovarian   Alzheimer's disease Father    Hypertension Sister    Cancer Sister        ?uterine   Hypertension Sister    Diabetes Sister    Hyperlipidemia Sister    Hypertension Sister    Diabetes Brother    Renal Disease Brother    Heart disease Brother    Diabetes Daughter    Seizures Daughter    Alcohol abuse Son    Drug abuse Son        heroin, prescription opiates   Alcohol abuse Son    Colon cancer Neg Hx    Liver cancer Neg Hx    Esophageal cancer Neg Hx     Social History   Socioeconomic History   Marital status: Married    Spouse name: Deb   Number of children: 5   Years of education: 12   Highest education level: High school graduate  Occupational History   Occupation: Designer, television/film set DRIVER    Employer: [employer]: Retired   Tobacco Use   Smoking status: Former    Packs/day: 1.00    Years: 45.00    Total pack years: 45.00    Types: Cigarettes   Smokeless tobacco: Never   Tobacco comments:    Quit in 2008  Vaping Use   Vaping Use: Never used  Substance and Sexual Activity   Alcohol use: No   Drug use: Never   Sexual activity: Yes    Partners: Female  Other Topics Concern   Not on file  Social History Narrative   Lives with his wife and their youngest son Paramedic. Has four sons and one daughter. Has 9 grandchildren.      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    Drinks 2 cups of coffee a day    Social Determinants of Health   Financial Resource Strain: Low Risk  (04/05/2017)   Overall Financial Resource Strain (CARDIA)    Difficulty of Paying Living Expenses: Not very hard  Food Insecurity: No Food Insecurity (04/05/2017)   Hunger Vital Sign    Worried About Running Out of Food in the Last Year: Never true    Ran Out of Food in the Last Year: Never true  Transportation Needs: No Transportation Needs (04/05/2017)   PRAPARE - Hydrologist (Medical): No    Lack of Transportation (Non-Medical): No  Physical Activity: Inactive (04/05/2017)   Exercise Vital Sign    Days of Exercise per Week: 0 days    Minutes of Exercise per Session: 0 min  Stress: Stress Concern Present (04/05/2017)   [place]    Feeling of Stress : Rather much  Social Connections: Moderately Integrated (04/05/2017)  Social Licensed conveyancer [NHANES]    Frequency of Communication with Friends and Family: More than three times a week    Frequency of Social Gatherings with Friends and Family: More than three times a week    Attends Religious Services: More than 4 times per year    Active Member of [employer] or Organizations: No    Attends Archivist Meetings: Never    Marital Status: Married   Human resources officer Violence: Not At Risk (04/05/2017)   Humiliation, Afraid, Rape, and Kick questionnaire    Fear of Current or Ex-Partner: No    Emotionally Abused: No    Physically Abused: No    Sexually Abused: No    Review of Systems:  All other review of systems negative except as mentioned in the HPI.  Physical Exam: Vital signs BP (!) 149/75   Pulse 62   Temp 98.4 F (36.9 C) (Temporal)   Ht 5' 4"$  (1.626 m)   Wt 181 lb (82.1 kg)   SpO2 98%   BMI 31.07 kg/m   General:   Alert,  Well-developed, well-nourished, pleasant and cooperative in NAD Lungs:  Clear throughout to auscultation.   Heart:  Regular rate and rhythm; no murmurs, clicks, rubs,  or gallops. Abdomen:  Soft, nontender and nondistended. Normal bowel sounds.   Neuro/Psych:  Alert and cooperative. Normal mood and affect. A and O x 3   @Warren Lindahl$  Simonne Maffucci, MD, [facility] [phone] (pager) 06/23/2022 11:52 AM@

## 2022-06-23 NOTE — Patient Instructions (Addendum)
There is inflammation ands "almost an ulcer" in the upper intestine. I took stomach biopsies to see if there is an infection related.  Anti-inflammatory medicines were most likely cause. Try to avoid thoise but I am starting acid blocking medicine to heal  things and will leave you on that I think. So if you have a true need for those can use but best to limit.  Meds ordered this encounter  Medications      linaclotide (LINZESS) 145 MCG CAPS capsule    Sig: Take 1 capsule (145 mcg total) by mouth daily before breakfast.    Dispense:  30 capsule    Refill:  11   pantoprazole (PROTONIX) 40 MG tablet    Sig: Take 1 tablet (40 mg total) by mouth daily.    Dispense:  90 tablet    Refill:  3     Will reschedule colonoscopy with extra prep and I have prescribed a higher dose of Linzess to take. I sent Rx to [facility]  I appreciate the opportunity to care for you. Gatha Mayer, MD, FACP   Colonoscopy scheduled for Wednesday Feb. 21 at 9:00 am with double prep. Instructions given.    YOU HAD AN ENDOSCOPIC PROCEDURE TODAY AT [facility]:   Refer to the procedure report that was given to you for any specific questions about what was found during the examination.  If the procedure report does not answer your questions, please call your gastroenterologist to clarify.  If you requested that your care partner not be given the details of your procedure findings, then the procedure report has been included in a sealed envelope for you to review at your convenience later.  YOU SHOULD EXPECT: Some feelings of bloating in the abdomen. Passage of more gas than usual.  Walking can help get rid of the air that was put into your GI tract during the procedure and reduce the bloating. If you had a lower endoscopy (such as a colonoscopy or flexible sigmoidoscopy) you may notice spotting of blood in your stool or on the toilet paper. If you underwent a bowel prep for your procedure, you may not have  a normal bowel movement for a few days.  Please Note:  You might notice some irritation and congestion in your nose or some drainage.  This is from the oxygen used during your procedure.  There is no need for concern and it should clear up in a day or so.  SYMPTOMS TO REPORT IMMEDIATELY:  Following upper endoscopy (EGD)  Vomiting of blood or coffee ground material  New chest pain or pain under the shoulder blades  Painful or persistently difficult swallowing  New shortness of breath  Fever of 100F or higher  Black, tarry-looking stools  For urgent or emergent issues, a gastroenterologist can be reached at any hour by calling [phone]. Do not use MyChart messaging for urgent concerns.    DIET:  We do recommend a small meal at first, but then you may proceed to your regular diet.  Drink plenty of fluids but you should avoid alcoholic beverages for 24 hours.  ACTIVITY:  You should plan to take it easy for the rest of today and you should NOT DRIVE or use heavy machinery until tomorrow (because of the sedation medicines used during the test).    FOLLOW UP: Our staff will call the number listed on your records the next business day following your procedure.  We will call around 7:15- 8:00  am to check on you and address any questions or concerns that you may have regarding the information given to you following your procedure. If we do not reach you, we will leave a message.     If any biopsies were taken you will be contacted by phone or by letter within the next 1-3 weeks.  Please call us at [phone] if you have not heard about the biopsies in 3 weeks.    SIGNATURES/CONFIDENTIALITY: You and/or your care partner have signed paperwork which will be entered into your electronic medical record.  These signatures attest to the fact that that the information above on your After Visit Summary has been reviewed and is understood.  Full responsibility of the confidentiality of this  discharge information lies with you and/or your care-partner.

## 2022-06-23 NOTE — Progress Notes (Signed)
PT reported only having 1 BM after first half of mralax prep and results after second half are cloudy brown. Discussed with Dr. Carlean Purl and will just plan to do EGD today and colon for a later time.

## 2022-06-23 NOTE — Progress Notes (Signed)
Report to pacu rn. Vss. Care resumed by rn. 

## 2022-06-23 NOTE — Progress Notes (Signed)
Called to room to assist during endoscopic procedure.  Patient ID and intended procedure confirmed with present staff. Received instructions for my participation in the procedure from the performing physician.  

## 2022-06-23 NOTE — Op Note (Signed)
[facility] Patient Name: Rodney Walsh Procedure Date: 06/23/2022 11:59 AM MRN: [id number] Endoscopist: Gatha Mayer , MD, [id number] Age: 72 Referring MD:  Date of Birth: [birth year] Gender: Male Account #: [id number] Procedure:                Upper GI endoscopy Indications:              Abnormal CT of the GI tract, Anemia Medicines:                Monitored Anesthesia Care Procedure:                Pre-Anesthesia Assessment:                           - Prior to the procedure, a History and Physical                            was performed, and patient medications and                            allergies were reviewed. The patient's tolerance of                            previous anesthesia was also reviewed. The risks                            and benefits of the procedure and the sedation                            options and risks were discussed with the patient.                            All questions were answered, and informed consent                            was obtained. Prior Anticoagulants: The patient has                            taken no anticoagulant or antiplatelet agents. ASA                            Grade Assessment: III - A patient with severe                            systemic disease. After reviewing the risks and                            benefits, the patient was deemed in satisfactory                            condition to undergo the procedure.                           After obtaining informed consent, the endoscope was  passed under direct vision. Throughout the                            procedure, the patient's blood pressure, pulse, and                            oxygen saturations were monitored continuously. The                            Olympus scope [id number] was introduced through the                            mouth, and advanced to the second part of duodenum.                            The upper GI  endoscopy was accomplished without                            difficulty. The patient tolerated the procedure                            well. Scope In: Scope Out: Findings:                 Diffuse moderate inflammation characterized by                            congestion (edema), erosions and erythema was found                            in the duodenal bulb.                           Patchy mildly erythematous mucosa without bleeding                            was found in the gastric antrum. Biopsies were                            taken with a cold forceps for histology.                            Verification of patient identification for the                            specimen was done. Estimated blood loss was minimal.                           The exam was otherwise without abnormality.                           The cardia and gastric fundus were normal on                            retroflexion. Complications:  No immediate complications. Estimated Blood Loss:     Estimated blood loss was minimal. Impression:               - Duodenitis.                           - Erythematous mucosa in the antrum. Biopsied.                           - The examination was otherwise normal. Recommendation:           - Patient has a contact number available for                            emergencies. The signs and symptoms of potential                            delayed complications were discussed with the                            patient. Return to normal activities tomorrow.                            Written discharge instructions were provided to the                            patient.                           - Resume previous diet.                           - Continue present medications.                           - Await pathology results.                           - Reschedule colonoscopy w/ double prep since                            single prep did not work today DX-  hx colon polyp                           Change Linzess from 72 ug daily to 145 ug daily (Rx                            sent)                           restart pantoprazole 40 mg qd (he took 30 days                            after CT suggested duodenal ulcer 10/23) - has been  off NSAIDs - try to stay off but once on PPI can                            use if needed but would limit as much as possible Gatha Mayer, MD 06/23/2022 12:26:35 PM This report has been signed electronically.

## 2022-06-24 ENCOUNTER — Telehealth: Payer: Self-pay | Admitting: *Deleted

## 2022-06-24 NOTE — Telephone Encounter (Signed)
Post procedure follow up phone call. No answer at number given.  Left message on voicemail.  

## 2022-06-29 ENCOUNTER — Ambulatory Visit (AMBULATORY_SURGERY_CENTER): Payer: Medicare Other | Admitting: Internal Medicine

## 2022-06-29 ENCOUNTER — Encounter: Payer: Self-pay | Admitting: Internal Medicine

## 2022-06-29 ENCOUNTER — Telehealth: Payer: Self-pay | Admitting: Internal Medicine

## 2022-06-29 VITALS — BP 148/59 | HR 63 | Temp 98.1°F | Resp 15 | Ht 64.0 in | Wt 181.0 lb

## 2022-06-29 DIAGNOSIS — B9681 Helicobacter pylori [H. pylori] as the cause of diseases classified elsewhere: Secondary | ICD-10-CM

## 2022-06-29 DIAGNOSIS — Z860100 Personal history of colon polyps, unspecified: Secondary | ICD-10-CM

## 2022-06-29 DIAGNOSIS — Z09 Encounter for follow-up examination after completed treatment for conditions other than malignant neoplasm: Secondary | ICD-10-CM | POA: Diagnosis not present

## 2022-06-29 DIAGNOSIS — K51414 Inflammatory polyps of colon with abscess: Secondary | ICD-10-CM

## 2022-06-29 DIAGNOSIS — K2970 Gastritis, unspecified, without bleeding: Secondary | ICD-10-CM

## 2022-06-29 DIAGNOSIS — D120 Benign neoplasm of cecum: Secondary | ICD-10-CM | POA: Diagnosis not present

## 2022-06-29 DIAGNOSIS — D649 Anemia, unspecified: Secondary | ICD-10-CM

## 2022-06-29 HISTORY — DX: Helicobacter pylori (H. pylori) as the cause of diseases classified elsewhere: B96.81

## 2022-06-29 MED ORDER — BISMUTH SUBSALICYLATE 262 MG PO TABS: 2.0000 | ORAL_TABLET | Freq: Four times a day (QID) | ORAL | 0 refills | Status: AC

## 2022-06-29 MED ORDER — SODIUM CHLORIDE 0.9 % IV SOLN: 500.0000 mL | INTRAVENOUS | Status: DC

## 2022-06-29 MED ORDER — DOXYCYCLINE MONOHYDRATE 100 MG PO CAPS: 100.0000 mg | ORAL_CAPSULE | Freq: Two times a day (BID) | ORAL | 0 refills | Status: AC

## 2022-06-29 MED ORDER — METRONIDAZOLE 250 MG PO TABS: 250.0000 mg | ORAL_TABLET | Freq: Four times a day (QID) | ORAL | 0 refills | Status: AC

## 2022-06-29 NOTE — Progress Notes (Signed)
History and Physical Interval Note:  06/29/2022 8:38 AM  Rodney Walsh  has presented today for endoscopic procedure(s), with the diagnosis of  Encounter Diagnoses  Name Primary?   Normocytic anemia Yes   Hx of colonic polyps   .  The various methods of evaluation and treatment have been discussed with the patient and/or family. After consideration of risks, benefits and other options for treatment, the patient has consented to  the endoscopic procedure(s).   The patient's history has been reviewed, patient examined, no change in status, stable for endoscopic procedure(s).  I have reviewed the patient's chart and labs.  Questions were answered to the patient's satisfaction.     Gatha Mayer, MD, [facility]

## 2022-06-29 NOTE — Progress Notes (Signed)
Pt's states no medical or surgical changes since previsit or office visit. 

## 2022-06-29 NOTE — Telephone Encounter (Signed)
PT is calling with concerns about after care following his colonoscopy today. He saw that he has to take 2 pepto bismol 4x a day for 14 days and he thinks that's a little too much or maybe a mistake and wants some clarity. Please advise.

## 2022-06-29 NOTE — Op Note (Signed)
Rodney Walsh Patient Name: Rodney Walsh Procedure Date: 06/29/2022 8:31 AM MRN: [id number] Endoscopist: Gatha Mayer , MD, [id number] Age: 72 Referring MD:  Date of Birth: Oct 06, 1950 Gender: Male Account #: [id number] Procedure:                Colonoscopy Indications:              Surveillance: Personal history of adenomatous                            polyps on last colonoscopy > 5 years ago, Last                            colonoscopy: 2014 Medicines:                Monitored Anesthesia Care Procedure:                Pre-Anesthesia Assessment:                           - Prior to the procedure, a History and Physical                            was performed, and patient medications and                            allergies were reviewed. The patient's tolerance of                            previous anesthesia was also reviewed. The risks                            and benefits of the procedure and the sedation                            options and risks were discussed with the patient.                            All questions were answered, and informed consent                            was obtained. Prior Anticoagulants: The patient has                            taken no anticoagulant or antiplatelet agents. ASA                            Grade Assessment: III - A patient with severe                            systemic disease. After reviewing the risks and                            benefits, the patient was deemed in satisfactory  condition to undergo the procedure.                           After obtaining informed consent, the colonoscope                            was passed under direct vision. Throughout the                            procedure, the patient's blood pressure, pulse, and                            oxygen saturations were monitored continuously. The                            CF [id number] was introduced through the  anus                            and advanced to the the cecum, identified by                            appendiceal orifice and ileocecal valve. The                            colonoscopy was somewhat difficult due to a                            redundant colon and significant looping. Successful                            completion of the procedure was aided by applying                            abdominal pressure. The patient tolerated the                            procedure well. The quality of the bowel                            preparation was adequate. The ileocecal valve,                            appendiceal orifice, and rectum were photographed.                            The bowel preparation used was Miralax via extended                            prep with split dose instruction. The bowel                            preparation used was GoLYTELY via extended prep  with split dose instruction. Scope In: 8:48:21 AM Scope Out: 9:15:28 AM Scope Withdrawal Time: 0 hours 19 minutes 0 seconds  Total Procedure Duration: 0 hours 27 minutes 7 seconds  Findings:                 The perianal and digital rectal examinations were                            normal.                           A 5 mm polyp was found in the cecum. The polyp was                            flat. The polyp was removed with a cold snare.                            Resection and retrieval were complete. Verification                            of patient identification for the specimen was                            done. Estimated blood loss was minimal.                           Multiple diverticula were found in the sigmoid                            colon.                           Internal hemorrhoids were found.                           The exam was otherwise without abnormality on                            direct and retroflexion views. Complications:            No immediate  complications. Estimated Blood Loss:     Estimated blood loss was minimal. Impression:               - One 5 mm polyp in the cecum, removed with a cold                            snare. Resected and retrieved.                           - Diverticulosis in the sigmoid colon.                           - Internal hemorrhoids.                           - The examination was otherwise normal on direct  and retroflexion views. Redundant and loopy, ? some                            effect frol left inguinal hernia                           - Personal history of colonic polyp diminutive                            adenoma 2009, no polyps 2014. Recommendation:           - Patient has a contact number available for                            emergencies. The signs and symptoms of potential                            delayed complications were discussed with the                            patient. Return to normal activities tomorrow.                            Written discharge instructions were provided to the                            patient.                           - Resume previous diet.                           - Continue present medications.                           - Await pathology results.                           - No recommendation at this time regarding repeat                            colonoscopy. Gatha Mayer, MD 06/29/2022 9:34:26 AM This report has been signed electronically.

## 2022-06-29 NOTE — Progress Notes (Signed)
Uneventful anesthetic. Report to pacu rn. Vss. Care resumed by rn.

## 2022-06-29 NOTE — Progress Notes (Signed)
Called to room to assist during endoscopic procedure.  Patient ID and intended procedure confirmed with present staff. Received instructions for my participation in the procedure from the performing physician.  

## 2022-06-29 NOTE — Telephone Encounter (Signed)
Returned patients phone call after speaking with his nurse to let him know that that is the correct order and not a mistake.He understood and had no further questions.

## 2022-06-29 NOTE — Patient Instructions (Addendum)
I found and removed one polyp. I will let you know pathology results and when/if to have another routine colonoscopy by mail and/or My Chart.  You also have a condition called diverticulosis - common and not usually a problem. Please read the handout provided. Hemorrhoids also seen.  The stomach biopsies from last week found an infection and listed below is the treatment.   1) pantoprazole 40 mg a day x 14 days (you have this already) 2) Pepto Bismol 2 tabs (262 mg each) 4 times a day x 14 days 3) Metronidazole 250 mg 4 times a day x 14 days 4) doxycycline 100 mg 2 times a day x 14 days  After 14 d stop pantoprazole also  In 4 weeks after treatment completed do H. Pylori stool antigen (go to lab today and get collection device but wait 4 weeks after finishing the medication to do the test)  I appreciate the opportunity to care for you. Gatha Mayer, MD, [facility]  Handouts provided on polyps, diverticulosis and hemorrhoids.  Resume previous diet.  Await pathology results.  No recommendation at this time regarding repeat colonoscopy.   YOU HAD AN ENDOSCOPIC PROCEDURE TODAY AT [facility]:   Refer to the procedure report that was given to you for any specific questions about what was found during the examination.  If the procedure report does not answer your questions, please call your gastroenterologist to clarify.  If you requested that your care partner not be given the details of your procedure findings, then the procedure report has been included in a sealed envelope for you to review at your convenience later.  YOU SHOULD EXPECT: Some feelings of bloating in the abdomen. Passage of more gas than usual.  Walking can help get rid of the air that was put into your GI tract during the procedure and reduce the bloating. If you had a lower endoscopy (such as a colonoscopy or flexible sigmoidoscopy) you may notice spotting of blood in your stool or on the toilet paper. If you  underwent a bowel prep for your procedure, you may not have a normal bowel movement for a few days.  Please Note:  You might notice some irritation and congestion in your nose or some drainage.  This is from the oxygen used during your procedure.  There is no need for concern and it should clear up in a day or so.  SYMPTOMS TO REPORT IMMEDIATELY:  Following lower endoscopy (colonoscopy or flexible sigmoidoscopy):  Excessive amounts of blood in the stool  Significant tenderness or worsening of abdominal pains  Swelling of the abdomen that is new, acute  Fever of 100F or higher  For urgent or emergent issues, a gastroenterologist can be reached at any hour by calling [phone]. Do not use MyChart messaging for urgent concerns.    DIET:  We do recommend a small meal at first, but then you may proceed to your regular diet.  Drink plenty of fluids but you should avoid alcoholic beverages for 24 hours.  ACTIVITY:  You should plan to take it easy for the rest of today and you should NOT DRIVE or use heavy machinery until tomorrow (because of the sedation medicines used during the test).    FOLLOW UP: Our staff will call the number listed on your records the next business day following your procedure.  We will call around 7:15- 8:00 am to check on you and address any questions or concerns that you may have  regarding the information given to you following your procedure. If we do not reach you, we will leave a message.     If any biopsies were taken you will be contacted by phone or by letter within the next 1-3 weeks.  Please call us at [phone] if you have not heard about the biopsies in 3 weeks.    SIGNATURES/CONFIDENTIALITY: You and/or your care partner have signed paperwork which will be entered into your electronic medical record.  These signatures attest to the fact that that the information above on your After Visit Summary has been reviewed and is understood.  Full  responsibility of the confidentiality of this discharge information lies with you and/or your care-partner.

## 2022-06-30 ENCOUNTER — Telehealth: Payer: Self-pay | Admitting: *Deleted

## 2022-06-30 NOTE — Telephone Encounter (Signed)
  Follow up Call-     06/29/2022    8:14 AM 06/23/2022   10:56 AM  Call back number  Post procedure Call Back phone  # [phone]  Permission to leave phone message Yes Yes     Patient questions:  Do you have a fever, pain , or abdominal swelling? No. Pain Score  0 *  Have you tolerated food without any problems? Yes.    Have you been able to return to your normal activities? Yes.    Do you have any questions about your discharge instructions: Diet   No. Medications  No. Follow up visit  No.  Do you have questions or concerns about your Care? No.  Actions: * If pain score is 4 or above: No action needed, pain <4.

## 2022-07-04 ENCOUNTER — Encounter: Payer: Self-pay | Admitting: Internal Medicine

## 2022-07-18 ENCOUNTER — Other Ambulatory Visit: Payer: Medicare Other

## 2022-07-18 DIAGNOSIS — B9681 Helicobacter pylori [H. pylori] as the cause of diseases classified elsewhere: Secondary | ICD-10-CM

## 2022-07-20 LAB — H. PYLORI ANTIGEN, STOOL: H pylori Ag, Stl: NEGATIVE

## 2022-07-22 ENCOUNTER — Other Ambulatory Visit: Payer: Self-pay

## 2022-07-22 DIAGNOSIS — B9681 Helicobacter pylori [H. pylori] as the cause of diseases classified elsewhere: Secondary | ICD-10-CM

## 2022-07-28 ENCOUNTER — Telehealth: Payer: Self-pay | Admitting: Internal Medicine

## 2022-07-28 NOTE — Telephone Encounter (Signed)
PT has to do another stool sample and wants to know if he should continue taking pantoprazole, Please advise.

## 2022-07-28 NOTE — Telephone Encounter (Signed)
Pt questions if he needs to stop taking the Pantoprazole.  Pt chart reviewed and noted recent documentation/recommendations by Dr. Carlean Purl  that was provided to pt  "Like him to stay off pantoprazole and retest stool  H pylori again - 4 weeks + after stopping abx"           Pt made aware. Pt verbalized understanding with all questions answered.

## 2022-08-01 ENCOUNTER — Other Ambulatory Visit: Payer: Medicare Other

## 2022-08-16 ENCOUNTER — Other Ambulatory Visit: Payer: Medicare Other

## 2022-08-16 DIAGNOSIS — B9681 Helicobacter pylori [H. pylori] as the cause of diseases classified elsewhere: Secondary | ICD-10-CM

## 2022-08-18 ENCOUNTER — Telehealth: Payer: Self-pay | Admitting: Internal Medicine

## 2022-08-18 LAB — H. PYLORI ANTIGEN, STOOL: H pylori Ag, Stl: NEGATIVE

## 2022-08-18 NOTE — Telephone Encounter (Signed)
Message left that H. pylori is gone.  Follow-up as needed.  Will also send MyChart message.

## 2023-04-04 ENCOUNTER — Other Ambulatory Visit (HOSPITAL_COMMUNITY): Payer: Self-pay | Admitting: Urology

## 2023-04-04 DIAGNOSIS — R9721 Rising PSA following treatment for malignant neoplasm of prostate: Secondary | ICD-10-CM

## 2023-04-04 DIAGNOSIS — C61 Malignant neoplasm of prostate: Secondary | ICD-10-CM

## 2023-04-17 ENCOUNTER — Encounter (HOSPITAL_COMMUNITY)
Admission: RE | Admit: 2023-04-17 | Discharge: 2023-04-17 | Disposition: A | Discharge status: Home / Self Care | Payer: Medicare Other | Source: Ambulatory Visit | Attending: Urology | Admitting: Urology

## 2023-04-17 DIAGNOSIS — R9721 Rising PSA following treatment for malignant neoplasm of prostate: Secondary | ICD-10-CM | POA: Diagnosis present

## 2023-04-17 DIAGNOSIS — C61 Malignant neoplasm of prostate: Secondary | ICD-10-CM | POA: Insufficient documentation

## 2023-04-17 MED ORDER — FLOTUFOLASTAT F 18 GALLIUM 296-5846 MBQ/ML IV SOLN
8.2200 | Freq: Once | INTRAVENOUS | Status: AC
  Administered 2023-04-17: 8.22 via INTRAVENOUS

## 2023-12-15 ENCOUNTER — Encounter: Admitting: Physical Medicine and Rehabilitation

## 2024-03-11 ENCOUNTER — Encounter: Payer: Self-pay | Admitting: Radiology
